# Patient Record
Sex: Female | Born: 1970
Health system: Southern US, Community
[De-identification: ages and names within clinical notes are randomized; demographics above are authoritative.]

## PROBLEM LIST (undated history)

## (undated) DIAGNOSIS — T7840XA Allergy, unspecified, initial encounter: Secondary | ICD-10-CM

## (undated) DIAGNOSIS — K219 Gastro-esophageal reflux disease without esophagitis: Secondary | ICD-10-CM

## (undated) DIAGNOSIS — O30049 Twin pregnancy, dichorionic/diamniotic, unspecified trimester: Secondary | ICD-10-CM

## (undated) DIAGNOSIS — C50919 Malignant neoplasm of unspecified site of unspecified female breast: Secondary | ICD-10-CM

## (undated) HISTORY — PX: LASIK: SHX215

## (undated) HISTORY — DX: Allergy, unspecified, initial encounter: T78.40XA

## (undated) HISTORY — DX: Malignant neoplasm of unspecified site of unspecified female breast: C50.919

## (undated) HISTORY — PX: HERNIA REPAIR: SHX51

## (undated) HISTORY — DX: Twin pregnancy, dichorionic/diamniotic, unspecified trimester: O30.049

---

## 2006-01-10 ENCOUNTER — Other Ambulatory Visit: Admission: RE | Admit: 2006-01-10 | Discharge: 2006-01-10 | Payer: Self-pay | Admitting: Gynecology

## 2006-01-16 ENCOUNTER — Ambulatory Visit (HOSPITAL_COMMUNITY): Admission: RE | Admit: 2006-01-16 | Discharge: 2006-01-16 | Payer: Self-pay | Admitting: Gynecology

## 2006-03-16 ENCOUNTER — Ambulatory Visit (HOSPITAL_BASED_OUTPATIENT_CLINIC_OR_DEPARTMENT_OTHER): Admission: RE | Admit: 2006-03-16 | Discharge: 2006-03-16 | Payer: Self-pay | Admitting: Gynecology

## 2006-03-16 ENCOUNTER — Encounter (INDEPENDENT_AMBULATORY_CARE_PROVIDER_SITE_OTHER): Payer: Self-pay | Admitting: *Deleted

## 2007-03-27 ENCOUNTER — Other Ambulatory Visit: Admission: RE | Admit: 2007-03-27 | Discharge: 2007-03-27 | Payer: Self-pay | Admitting: Gynecology

## 2010-12-19 ENCOUNTER — Encounter: Payer: Self-pay | Admitting: Gynecology

## 2011-04-15 NOTE — Op Note (Signed)
NAMEKENOSHA, DOSTER NO.:  [id number]   MEDICAL RECORD NO.:  [id number]          PATIENT TYPE:  AMB   LOCATION:  [facility]                         FACILITY:  [facility]   PHYSICIAN:  Timothy P. 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   POSTOPERATIVE DIAGNOSES:  Endometrial polyp.   PROCEDURE:  Hysteroscopic resection endometrial polyp, dilatation and  curettage.   SURGEON:  Timothy P. Fontaine, M.D.   ANESTHESIA:  General.   ESTIMATED BLOOD LOSS:  Minimal.   COMPLICATIONS:  None.   SPECIMEN:  1.  Endometrial polyps.  2.  Endometrial curettings   FINDINGS:  EUA revealed external BUS and vagina normal.  Cervix normal.  Uterus mildly retroverted, normal in size. Hysteroscopic small upper  posterior wall midline endometrial polyp, hysteroscopy, otherwise normal  noting fundus, anterior and posterior uterine surfaces, lower uterine  segment, endocervical canal, right and left tubal ostia all visualized.   PROCEDURE:  The patient was the patient was taken to the operating room,  underwent general anesthesia, was placed in a low dorsal lithotomy position,  received a perineal preparation with Betadine solution and vaginal  preparation with Betadine solution.  The bladder emptied with in-and-out  Foley catheterization.  EUA performed. The patient was draped in usual  fashion.  The cervix was visualized with a speculum, anterior lip grasped  with a single-tooth tenaculum, and the cervix was gently gradually dilated  to admit the operative hysteroscope.  Hysteroscopy was performed with  findings noted above.  Using the right-angle resectoscopic loop, the  endometrial polyp was resected at the level of the surrounding endometrium  and sent to pathology.  A sharp curettage was then performed and sent  to  pathology.  Repeat hysteroscopy showed an empty cavity, good distension, no  evidence of  perforation.  The instruments were removed.  The tenaculum removed.  Adequate hemostasis was visualized.  The speculum removed.  The patient was  placed in a supine position, awakened without difficulty, and taken to  recovery room in good condition having tolerated procedure well.      Timothy P. Fontaine, M.D.  Electronically Signed     TPF/MEDQ  D:  03/16/2006  T:  03/16/2006  Job:  [id number]

## 2011-04-15 NOTE — H&P (Signed)
Sierra Hensley, WIEDMAN NO.:  [id number]   MEDICAL RECORD NO.:  [id number]          PATIENT TYPE:  AMB   LOCATION:  [facility]                         FACILITY:  [facility]   PHYSICIAN:  Timothy P. Fontaine, M.D.DATE OF BIRTH:  02/27/1971   DATE OF ADMISSION:  03/16/2006  DATE OF DISCHARGE:                                HISTORY & PHYSICAL   CHIEF COMPLAINT:  Submucous myoma.   HISTORY OF PRESENT ILLNESS:  A 40 year old G0 who initially presented for a  gynecologic exam, complaining of infertility.  She underwent an HSG, which  showed a round filling defect, consistent with a submucous myoma.  She  underwent a sonohystogram, which confirmed an 8 x 6 mm endometrial defect,  consistent with a submucous myoma.  She is admitted at this time for  hysteroscopy resection of her myoma.   PAST SURGICAL HISTORY:  None.   PAST MEDICAL HISTORY:  None.   ALLERGIES:  None.   CURRENT MEDICATIONS:  None.   REVIEW OF SYSTEMS:  Noncontributory.   SOCIAL HISTORY:  Noncontributory.   FAMILY HISTORY:  Noncontributory.   ADMISSION PHYSICAL EXAMINATION:  VITAL SIGNS:  Afebrile.  Vital signs are  stable.  HEENT:  Normal.  LUNGS:  Clear.  CARDIAC:  Regular rate.  No murmurs, rubs or gallops.  ABDOMEN:  Benign.  PELVIC:  External BUS, vagina normal.  Cervix normal.  Uterus normal size.  Midline mobile, nontender.  Adnexa without masses or tenderness.   ASSESSMENT:  A 40 year old G0, HSG and sonohystogram both show endometrial  defect, consistent with a submucous myoma for hysteroscopy resection of  defect.  I reviewed with the patient and her friend interpreter, who is  involved with hysteroscopic surgery, to include the expected intraoperative  and postoperative courses.  The use of the resectoscope was reviewed, and  the risks of bleeding, transfusion, infection, prolonged antibiotics,  uterine perforation, damage to internal organs, including bowel, bladder,  ureters, vessels, and  nerves necessitating major exploratory reparative  surgeries and future reparative surgeries, including ostomy formation, was  all discussed, understood, and accepted.  She is attempted pregnancy and the  possibilities for intrauterine scarring and possible adverse effects as far  as fertility is concerned related to the surgery were all discussed,  understood, and accepted.  The use of sorbitol distendant and the  possibilities of absorption causing metabolic complications such as seizure,  coma, was also discussed, understood, and accepted.  No guarantees, as far  as pregnancy, is related, and she  understands that she may not achieve pregnancy, despite having the surgery,  all of which was understood and accepted.  The patient's questions are  answered to her satisfaction through her interpreter, and she is ready to  proceed with surgery.      Timothy P. Fontaine, M.D.  Electronically Signed     TPF/MEDQ  D:  03/13/2006  T:  03/13/2006  Job:  [id number]

## 2012-09-21 ENCOUNTER — Encounter: Payer: Self-pay | Admitting: Gastroenterology

## 2012-10-17 ENCOUNTER — Ambulatory Visit: Payer: Self-pay | Admitting: Gastroenterology

## 2013-06-14 ENCOUNTER — Other Ambulatory Visit: Payer: Self-pay | Admitting: Obstetrics and Gynecology

## 2013-07-01 ENCOUNTER — Other Ambulatory Visit (HOSPITAL_COMMUNITY): Payer: Self-pay | Admitting: Obstetrics and Gynecology

## 2013-07-01 DIAGNOSIS — IMO0002 Reserved for concepts with insufficient information to code with codable children: Secondary | ICD-10-CM

## 2013-07-04 ENCOUNTER — Ambulatory Visit (HOSPITAL_COMMUNITY)
Admission: RE | Admit: 2013-07-04 | Discharge: 2013-07-04 | Disposition: A | Discharge status: Home / Self Care | Payer: BC Managed Care – PPO | Source: Ambulatory Visit | Attending: Obstetrics and Gynecology | Admitting: Obstetrics and Gynecology

## 2013-07-04 DIAGNOSIS — N979 Female infertility, unspecified: Secondary | ICD-10-CM | POA: Insufficient documentation

## 2013-07-04 DIAGNOSIS — IMO0002 Reserved for concepts with insufficient information to code with codable children: Secondary | ICD-10-CM

## 2013-07-04 MED ORDER — IOHEXOL 300 MG/ML  SOLN
20.0000 mL | Freq: Once | INTRAMUSCULAR | Status: AC | PRN
  Administered 2013-07-04: 9 mL

## 2013-07-25 ENCOUNTER — Other Ambulatory Visit (HOSPITAL_COMMUNITY): Payer: Self-pay | Admitting: Obstetrics and Gynecology

## 2013-07-25 DIAGNOSIS — Z3141 Encounter for fertility testing: Secondary | ICD-10-CM

## 2013-07-30 ENCOUNTER — Ambulatory Visit (HOSPITAL_COMMUNITY)
Admission: RE | Admit: 2013-07-30 | Discharge: 2013-07-30 | Disposition: A | Discharge status: Home / Self Care | Payer: BC Managed Care – PPO | Source: Ambulatory Visit | Attending: Obstetrics and Gynecology | Admitting: Obstetrics and Gynecology

## 2013-07-30 DIAGNOSIS — Z3141 Encounter for fertility testing: Secondary | ICD-10-CM

## 2013-07-30 DIAGNOSIS — N979 Female infertility, unspecified: Secondary | ICD-10-CM | POA: Insufficient documentation

## 2013-07-30 MED ORDER — IOHEXOL 300 MG/ML  SOLN
20.0000 mL | Freq: Once | INTRAMUSCULAR | Status: AC | PRN
  Administered 2013-07-30: 20 mL

## 2013-09-09 DIAGNOSIS — K219 Gastro-esophageal reflux disease without esophagitis: Secondary | ICD-10-CM | POA: Insufficient documentation

## 2013-10-09 ENCOUNTER — Other Ambulatory Visit: Payer: Self-pay | Admitting: Gastroenterology

## 2013-10-09 DIAGNOSIS — R1310 Dysphagia, unspecified: Secondary | ICD-10-CM

## 2013-10-14 ENCOUNTER — Ambulatory Visit
Admission: RE | Admit: 2013-10-14 | Discharge: 2013-10-14 | Disposition: A | Discharge status: Home / Self Care | Payer: BC Managed Care – PPO | Source: Ambulatory Visit | Attending: Gastroenterology | Admitting: Gastroenterology

## 2013-10-14 DIAGNOSIS — R1310 Dysphagia, unspecified: Secondary | ICD-10-CM

## 2014-05-15 LAB — OB RESULTS CONSOLE HIV ANTIBODY (ROUTINE TESTING): HIV: NONREACTIVE

## 2014-07-16 LAB — OB RESULTS CONSOLE RUBELLA ANTIBODY, IGM: Rubella: IMMUNE

## 2014-07-16 LAB — OB RESULTS CONSOLE RPR: RPR: NONREACTIVE

## 2014-07-16 LAB — OB RESULTS CONSOLE GC/CHLAMYDIA
CHLAMYDIA, DNA PROBE: NEGATIVE
GC PROBE AMP, GENITAL: NEGATIVE

## 2014-07-16 LAB — OB RESULTS CONSOLE HEPATITIS B SURFACE ANTIGEN: HEP B S AG: NEGATIVE

## 2014-11-06 ENCOUNTER — Encounter: Payer: Self-pay | Admitting: *Deleted

## 2014-11-06 ENCOUNTER — Encounter: Payer: Medicaid Other | Attending: Obstetrics and Gynecology | Admitting: *Deleted

## 2014-11-06 DIAGNOSIS — Z713 Dietary counseling and surveillance: Secondary | ICD-10-CM | POA: Insufficient documentation

## 2014-11-06 DIAGNOSIS — O24419 Gestational diabetes mellitus in pregnancy, unspecified control: Secondary | ICD-10-CM | POA: Diagnosis present

## 2014-11-06 NOTE — Progress Notes (Signed)
  Patient was seen on 11/06/14 for individual Gestational Diabetes self-management education at [facility]. She is accompanied by a family member and she has signed a Glass blower/designer for interpretive services.    The following learning objectives were met by the patient during this appointment:   States the definition of Gestational Diabetes  States why dietary management is important in controlling blood glucose  Describes the effects each nutrient has on blood glucose levels  Demonstrates ability to create a balanced meal plan  Demonstrates carbohydrate counting   States when to check blood glucose levels  Demonstrates proper blood glucose monitoring techniques  States the effect of stress and exercise on blood glucose levels  States the importance of limiting caffeine and abstaining from alcohol and smoking  Blood glucose monitor given: Accu Chek Nano BG Monitoring Kit Lot # [id number] Exp: 07/29/15 Blood glucose reading: 102 mg/dl (fasting)  Patient instructed to monitor glucose levels: FBS: 60 - <90 1 hour: <140   *Patient received handouts:  Nutrition Diabetes and Pregnancy in [place]  Patient will be seen for follow-up as needed.

## 2014-11-24 ENCOUNTER — Encounter: Payer: Self-pay | Admitting: Obstetrics and Gynecology

## 2014-11-29 ENCOUNTER — Inpatient Hospital Stay (HOSPITAL_COMMUNITY)
Admission: AD | Admit: 2014-11-29 | Discharge: 2014-12-12 | DRG: 765 | Disposition: A | Discharge status: Home / Self Care | Payer: Medicaid Other | Source: Ambulatory Visit | Attending: Obstetrics and Gynecology | Admitting: Obstetrics and Gynecology

## 2014-11-29 ENCOUNTER — Encounter (HOSPITAL_COMMUNITY): Payer: Self-pay | Admitting: *Deleted

## 2014-11-29 ENCOUNTER — Inpatient Hospital Stay (HOSPITAL_COMMUNITY): Payer: Medicaid Other

## 2014-11-29 ENCOUNTER — Encounter: Payer: Self-pay | Admitting: Obstetrics and Gynecology

## 2014-11-29 DIAGNOSIS — O30043 Twin pregnancy, dichorionic/diamniotic, third trimester: Secondary | ICD-10-CM | POA: Diagnosis present

## 2014-11-29 DIAGNOSIS — O321XX2 Maternal care for breech presentation, fetus 2: Secondary | ICD-10-CM | POA: Diagnosis not present

## 2014-11-29 DIAGNOSIS — O24420 Gestational diabetes mellitus in childbirth, diet controlled: Secondary | ICD-10-CM | POA: Diagnosis present

## 2014-11-29 DIAGNOSIS — O09513 Supervision of elderly primigravida, third trimester: Secondary | ICD-10-CM | POA: Diagnosis not present

## 2014-11-29 DIAGNOSIS — O42913 Preterm premature rupture of membranes, unspecified as to length of time between rupture and onset of labor, third trimester: Secondary | ICD-10-CM | POA: Diagnosis present

## 2014-11-29 DIAGNOSIS — Z3A32 32 weeks gestation of pregnancy: Secondary | ICD-10-CM | POA: Diagnosis present

## 2014-11-29 DIAGNOSIS — O30049 Twin pregnancy, dichorionic/diamniotic, unspecified trimester: Secondary | ICD-10-CM

## 2014-11-29 DIAGNOSIS — O321XX1 Maternal care for breech presentation, fetus 1: Principal | ICD-10-CM | POA: Diagnosis not present

## 2014-11-29 DIAGNOSIS — O4290 Premature rupture of membranes, unspecified as to length of time between rupture and onset of labor, unspecified weeks of gestation: Secondary | ICD-10-CM

## 2014-11-29 DIAGNOSIS — O322XX1 Maternal care for transverse and oblique lie, fetus 1: Secondary | ICD-10-CM

## 2014-11-29 DIAGNOSIS — O30009 Twin pregnancy, unspecified number of placenta and unspecified number of amniotic sacs, unspecified trimester: Secondary | ICD-10-CM | POA: Diagnosis present

## 2014-11-29 HISTORY — DX: Twin pregnancy, dichorionic/diamniotic, unspecified trimester: O30.049

## 2014-11-29 LAB — GLUCOSE, CAPILLARY
GLUCOSE-CAPILLARY: 121 mg/dL — AB (ref 70–99)
GLUCOSE-CAPILLARY: 134 mg/dL — AB (ref 70–99)
Glucose-Capillary: 89 mg/dL (ref 70–99)

## 2014-11-29 LAB — TYPE AND SCREEN
ABO/RH(D): B POS
ANTIBODY SCREEN: NEGATIVE

## 2014-11-29 LAB — CBC
HCT: 36.4 % (ref 36.0–46.0)
Hemoglobin: 12 g/dL (ref 12.0–15.0)
MCH: 30.1 pg (ref 26.0–34.0)
MCHC: 33 g/dL (ref 30.0–36.0)
MCV: 91.2 fL (ref 78.0–100.0)
PLATELETS: 199 10*3/uL (ref 150–400)
RBC: 3.99 MIL/uL (ref 3.87–5.11)
RDW: 13.7 % (ref 11.5–15.5)
WBC: 6 10*3/uL (ref 4.0–10.5)

## 2014-11-29 LAB — POCT FERN TEST: POCT FERN TEST: POSITIVE

## 2014-11-29 MED ORDER — SODIUM CHLORIDE 0.9 % IV SOLN
1.0000 g | Freq: Four times a day (QID) | INTRAVENOUS | Status: DC
  Administered 2014-11-29 – 2014-12-01 (×7): 1 g via INTRAVENOUS
  Filled 2014-11-29 (×10): qty 1000

## 2014-11-29 MED ORDER — PRENATAL MULTIVITAMIN CH
1.0000 | ORAL_TABLET | Freq: Every day | ORAL | Status: DC
  Administered 2014-11-29 – 2014-12-08 (×10): 1 via ORAL
  Filled 2014-11-29 (×11): qty 1

## 2014-11-29 MED ORDER — DOCUSATE SODIUM 100 MG PO CAPS
100.0000 mg | ORAL_CAPSULE | Freq: Every day | ORAL | Status: DC
  Administered 2014-11-29 – 2014-12-08 (×10): 100 mg via ORAL
  Filled 2014-11-29 (×12): qty 1

## 2014-11-29 MED ORDER — SODIUM CHLORIDE 0.9 % IV SOLN: 250.0000 mL | INTRAVENOUS | Status: DC | PRN

## 2014-11-29 MED ORDER — SODIUM CHLORIDE 0.9 % IV SOLN
2.0000 g | Freq: Once | INTRAVENOUS | Status: AC
  Administered 2014-11-29: 2 g via INTRAVENOUS
  Filled 2014-11-29: qty 2000

## 2014-11-29 MED ORDER — ZOLPIDEM TARTRATE 5 MG PO TABS: 5.0000 mg | ORAL_TABLET | Freq: Every evening | ORAL | Status: DC | PRN

## 2014-11-29 MED ORDER — LACTATED RINGERS IV BOLUS (SEPSIS)
1000.0000 mL | Freq: Once | INTRAVENOUS | Status: AC
  Administered 2014-11-29: 1000 mL via INTRAVENOUS

## 2014-11-29 MED ORDER — SODIUM CHLORIDE 0.9 % IJ SOLN
3.0000 mL | Freq: Two times a day (BID) | INTRAMUSCULAR | Status: DC
  Administered 2014-11-29 – 2014-12-08 (×19): 3 mL via INTRAVENOUS

## 2014-11-29 MED ORDER — SODIUM CHLORIDE 0.9 % IJ SOLN
3.0000 mL | INTRAMUSCULAR | Status: DC | PRN
  Administered 2014-12-02: 3 mL via INTRAVENOUS
  Filled 2014-11-29: qty 3

## 2014-11-29 MED ORDER — AZITHROMYCIN 250 MG PO TABS
500.0000 mg | ORAL_TABLET | Freq: Every day | ORAL | Status: AC
  Administered 2014-11-29 – 2014-12-05 (×7): 500 mg via ORAL
  Filled 2014-11-29 (×9): qty 2

## 2014-11-29 MED ORDER — ACETAMINOPHEN 325 MG PO TABS: 650.0000 mg | ORAL_TABLET | ORAL | Status: DC | PRN

## 2014-11-29 MED ORDER — BETAMETHASONE SOD PHOS & ACET 6 (3-3) MG/ML IJ SUSP
12.0000 mg | Freq: Once | INTRAMUSCULAR | Status: AC
  Administered 2014-11-30: 12 mg via INTRAMUSCULAR
  Filled 2014-11-29: qty 2

## 2014-11-29 MED ORDER — CALCIUM CARBONATE ANTACID 500 MG PO CHEW
2.0000 | CHEWABLE_TABLET | ORAL | Status: DC | PRN
  Filled 2014-11-29: qty 2

## 2014-11-29 MED ORDER — BETAMETHASONE SOD PHOS & ACET 6 (3-3) MG/ML IJ SUSP
12.0000 mg | Freq: Once | INTRAMUSCULAR | Status: AC
  Administered 2014-11-29: 12 mg via INTRAMUSCULAR
  Filled 2014-11-29: qty 2

## 2014-11-29 NOTE — Progress Notes (Signed)
Pt arrived by EMS, pt dripping clear fluid, fern positive.  Up to bathroom then to bed # 10.  TWINS, monitors applied, assessing.  Vitals stable.  Reported to Ginger RN and [employer].  Limited English and Ginger is calling Language Line at this time.

## 2014-11-29 NOTE — MAU Note (Signed)
Came in by EMS.  TWINS.  Water broke at 5:40 am.  Clear. No bleeding.  Babies moving well.

## 2014-11-29 NOTE — H&P (Signed)
Sierra Hensley is a 44 y.o. female, G1 P0, EGA [redacted]W[redacted]D with EDC 2-21, di/di twin pregnancy from donor eggs, presenting for evaluation of leaking fluid.  Eval in MAU confirms ROM, cervix not significantly dilated visually, transverse/transverse presentation by u/s.  Prenatal care complicated by di/di twins, elevated risk of Trisomy 21 by quad screen, normal 46XX and 46XY by amnio, GDM controlled with diet.  See prenatal records for complete history.  Maternal Medical History:  Reason for admission: Rupture of membranes.   Contractions: Frequency: irregular.   Perceived severity is mild.    Fetal activity: Perceived fetal activity is normal.    Prenatal Complications - Diabetes: gestational. Diabetes is managed by diet.      OB History    Gravida Para Term Preterm AB TAB SAB Ectopic Multiple Living   1              History reviewed. No pertinent past medical history. History reviewed. No pertinent past surgical history. Family History: family history is not on file. Social History:  reports that she has never smoked. She has never used smokeless tobacco. She reports that she does not drink alcohol or use illicit drugs.   Prenatal Transfer Tool  Maternal Diabetes: Yes:  Diabetes Type:  Diet controlled Genetic Screening: Normal Maternal Ultrasounds/Referrals: Normal Fetal Ultrasounds or other Referrals:  None Maternal Substance Abuse:  No Significant Maternal Medications:  None Significant Maternal Lab Results:  None Other Comments:  Di/di twins, PPPROM, elevated risk Trisomy 21 by quad screen, nl 46XX and 46XY by amnio, donor egg IVF  Review of Systems  Respiratory: Negative.   Cardiovascular: Negative.       Blood pressure 129/76, pulse 74, temperature 98.5 F (36.9 C), temperature source Oral, resp. rate 18, height  (1.626 m), weight 84.369 kg (186 lb), SpO2 100 %. Maternal Exam:  Uterine Assessment: Contraction strength is mild.  Contraction frequency is irregular.    Abdomen: Patient reports no abdominal tenderness. Introitus: Normal vulva. Normal vagina.  Ferning test: positive.  Amniotic fluid character: clear.     Fetal Exam Fetal Monitor Review: Mode: ultrasound.   Variability: moderate (6-25 bpm).   Pattern: accelerations present and no decelerations.    Fetal State Assessment: Category I - tracings are normal.     Physical Exam  Vitals reviewed. Constitutional: She appears well-developed and well-nourished.  Cardiovascular: Normal rate, regular rhythm and normal heart sounds.   No murmur heard. Respiratory: Effort normal and breath sounds normal. No respiratory distress. She has no wheezes.  GI: Soft.    Prenatal labs: ABO, Rh: --/--/B POS (01/02 0930) Antibody: PENDING (01/02 0930) Rubella:  Immune RPR:   NR HBsAg:   Neg HIV:   Neg GBS:   preterm GCT:  139, abnl GTT  Assessment/Plan: Di/di twin IUP at [redacted]W[redacted]D with PPROM of twin A.  Transverse/transverse by u/s today.  Will admit, Amp and Zithromax for latency, will give betamethasone for fetal lung maturity.  Will monitor for labor or signs/sx of chorio, will need c-section.  Will do c-section at 34+ weeks if has not delivered before then.  Will monitor sugars with GDM controlled with diet so far.   Hudson Majkowski D 11/29/2014, 9:56 AM

## 2014-11-29 NOTE — MAU Provider Note (Signed)
S: Sierra Hensley is a 44 y.o. year old G1P0 female at [redacted]w[redacted]d weeks gestation who presents to MAU reporting leaking large amount of clear fluid at 0540. Di/di twins.  GDM- no meds.   O:  Patient Vitals for the past 24 hrs:  BP Temp Temp src Pulse Resp SpO2  11/29/14 0711 129/76 mmHg 98.5 F (36.9 C) Oral 74 18 100 %   FHR 140, reactive, x 2 UC's Q3-6. Mild PELVIC: Pooling large amount of clear fluid. No able to clearly see cervix, but not obviously open. No bleeding.  OB Limited Prelim: A and B transverse   A: 32.6 week Di/Di twins, grossly ruptured PPROM A1GDM  P: Admit to Antenatal per consult w/ Dr. Jackelyn Knife. BMZ Amp IV   [place], [place] 11/29/2014 8:26 AM

## 2014-11-30 LAB — GLUCOSE, CAPILLARY
GLUCOSE-CAPILLARY: 89 mg/dL (ref 70–99)
Glucose-Capillary: 93 mg/dL (ref 70–99)
Glucose-Capillary: 104 mg/dL — ABNORMAL HIGH (ref 70–99)
Glucose-Capillary: 188 mg/dL — ABNORMAL HIGH (ref 70–99)

## 2014-11-30 LAB — RPR

## 2014-11-30 LAB — ABO/RH: ABO/RH(D): B POS

## 2014-11-30 NOTE — Progress Notes (Signed)
Antenatal Nutrition Assessment:  Currently  [redacted] weeks gestation, with twins/PPROM/GDM. Height  64 "  Weight 186 lbs  pre-pregnancy weight 165 lbs .  Pre-pregnancy  BMI 28.4  IBW 120 lbs Total weight gain 21.lbs Weight gain goals 31-50 lbs Estimated needs: 2100-2300 kcal/day, 90-100 grams protein/day, 2.5 liters fluid/day  Carbohydrate modified gestational diet  Current diet prescription will provide for increased needs.  Nutrition related labs (s/p BMZ) CBG (last 3)   Recent Labs  11/30/14 1041 11/30/14 1402 11/30/14 1856  GLUCAP 104* 188* 89      Nutrition Dx: Increased nutrient needs r/t pregnancy and fetal growth requirements aeb [redacted] weeks gestation.  No educational needs assessed at this time. GDM education 11/06/14 at [facility]  [facility] M.Odis Luster LDN Neonatal Nutrition Support Specialist/RD III Pager [phone]

## 2014-11-30 NOTE — Plan of Care (Signed)
Problem: Consults Goal: Birthing Suites Patient Information Press F2 to bring up selections list   Pt < [redacted] weeks EGA     

## 2014-11-30 NOTE — Progress Notes (Addendum)
HD #2, [redacted]W[redacted]D, di/di twins, PPROM, GDM Feels ok, leaking fluid, good FM, no significant ctx Afeb, VSS All sugars ok so far FHT- reactive x 2 Will continue Amp and Zithromax, second betamethasone this am, monitor for labor or infection, c-section in 8-9 days if not delivered

## 2014-12-01 LAB — GLUCOSE, CAPILLARY
GLUCOSE-CAPILLARY: 104 mg/dL — AB (ref 70–99)
Glucose-Capillary: 86 mg/dL (ref 70–99)
Glucose-Capillary: 96 mg/dL (ref 70–99)
Glucose-Capillary: 160 mg/dL — ABNORMAL HIGH (ref 70–99)

## 2014-12-01 MED ORDER — AMOXICILLIN 500 MG PO CAPS
500.0000 mg | ORAL_CAPSULE | Freq: Three times a day (TID) | ORAL | Status: AC
  Administered 2014-12-01 – 2014-12-06 (×15): 500 mg via ORAL
  Filled 2014-12-01 (×15): qty 1

## 2014-12-01 NOTE — Progress Notes (Signed)
HD #3, [redacted]W[redacted]D, twins, PPROM, GDM Doing ok, nothing new Afeb, VSS One sugar 188-probably due to dietary discretion, all others are ok FHT- reactive NST on both babies, occasional ctx Fundus NT Continue observation, change Ampicillin to Amoxicillin, continue Zithromax, continue to monitor sugars

## 2014-12-01 NOTE — Progress Notes (Signed)
CBG 160. Pt states she ate rice and had orange juice to drink for lunch. Educated about juice and carbs and instructed her to limit intake. Will continue to monitor CBG's.

## 2014-12-01 NOTE — Progress Notes (Signed)
Ur chart review completed.  

## 2014-12-02 LAB — GLUCOSE, CAPILLARY
Glucose-Capillary: 63 mg/dL — ABNORMAL LOW (ref 70–99)
Glucose-Capillary: 102 mg/dL — ABNORMAL HIGH (ref 70–99)
Glucose-Capillary: 99 mg/dL (ref 70–99)
Glucose-Capillary: 103 mg/dL — ABNORMAL HIGH (ref 70–99)

## 2014-12-02 NOTE — Plan of Care (Signed)
Problem: Consults Goal: Birthing Suites Patient Information Press F2 to bring up selections list   Pt < [redacted] weeks EGA     

## 2014-12-02 NOTE — Progress Notes (Signed)
[redacted]W[redacted]D, Twins, PPROM, GDM Doing ok, nothing new Afeb, VSS CBG with one elevated value daily FHT- reactive x 2, irreg ctx Continue Amox/Zithro and observation.  Has c-section scheduled for a week from today at 0730.

## 2014-12-03 LAB — GLUCOSE, CAPILLARY
GLUCOSE-CAPILLARY: 102 mg/dL — AB (ref 70–99)
Glucose-Capillary: 79 mg/dL (ref 70–99)
Glucose-Capillary: 94 mg/dL (ref 70–99)
Glucose-Capillary: 109 mg/dL — ABNORMAL HIGH (ref 70–99)

## 2014-12-03 NOTE — Plan of Care (Signed)
Problem: Consults Goal: Neonatologist Consult Spoke to Dr. Mikle Boswortharlos regarding pt need for consult.

## 2014-12-03 NOTE — Progress Notes (Signed)
Dr. Mikle Boswortharlos called and informed of pt's need for Neo consult, informed pt scheduled for delivery via Cesarean on 12/09/2014.

## 2014-12-03 NOTE — Consult Note (Signed)
Neonatology Consult to Antenatal Patient:  I was asked by Dr. Jackelyn KnifeMeisinger to see this patient in order to provide antenatal counseling due to anticipated c-section delivery of twins at [redacted] weeks GA.  Ms. Sierra Hensley was admitted 1/2 due to premature ROM and twin gestation. She is currently at  33 3/[redacted] weeks GA and is a diet-controlled GDM. She is not having active labor. She got 2 doses of BMZ on 1/2-3 and got IV Ampicillin for 3 days. She continues on po Amoxicillin and Zithromax for a 7-day course. The twins ar Di-Di from donor eggs, a girl and a boy, and both are  transverse. Amniocentesis showed normal 5746 XX and 7546 XY karyotypes. These are her first babies.  I spoke with the patient alone, via [place] interpreter. We discussed the planned delivery on Jan 12th, including usual DR management, possible respiratory complications and need for support, IV access, feedings (mother wants to  breast feed, but is unsure she will have adequate supply; I encouraged her and let her know that lactation support would be available), LOS, Mortality and Morbidity, and long term outcomes. She had a few questions, which I answered. I offered a NICU tour to any interested family members and would be glad to come back if she has more questions later.  Thank you for asking me to see this patient.  Doretha Souhristie C. Kameka Whan, MD Neonatologist  The total length of face-to-face or floor/unit time for this encounter was 25 minutes. Counseling and/or coordination of care was 20 minutes of the above.

## 2014-12-03 NOTE — Progress Notes (Signed)
[redacted]W[redacted]D, twins, PPROM, GDM Nothing new Afeb, VSS All CBG are normal FHT- reactive x 2, occ ctx Fundus NT Continue PO abx, monitor for infection or labor

## 2014-12-04 LAB — GLUCOSE, CAPILLARY
GLUCOSE-CAPILLARY: 60 mg/dL — AB (ref 70–99)
Glucose-Capillary: 106 mg/dL — ABNORMAL HIGH (ref 70–99)
Glucose-Capillary: 100 mg/dL — ABNORMAL HIGH (ref 70–99)
Glucose-Capillary: 113 mg/dL — ABNORMAL HIGH (ref 70–99)

## 2014-12-04 NOTE — Plan of Care (Signed)
Problem: Phase I Progression Outcomes Goal: Maintains reassuring Fetal Heart Rate Outcome: Progressing EFM tracing reassuring, meets criteria for reactive NST

## 2014-12-04 NOTE — Progress Notes (Signed)
[redacted]W[redacted]D, Twins, PPROM, GDM Nothing new Afeb, VSS All CBG are normal FHT- reactive x 2, occasional ctx Continue current care

## 2014-12-05 LAB — GLUCOSE, CAPILLARY
GLUCOSE-CAPILLARY: 97 mg/dL (ref 70–99)
GLUCOSE-CAPILLARY: 118 mg/dL — AB (ref 70–99)
Glucose-Capillary: 75 mg/dL (ref 70–99)
Glucose-Capillary: 155 mg/dL — ABNORMAL HIGH (ref 70–99)

## 2014-12-05 LAB — TYPE AND SCREEN
ABO/RH(D): B POS
ABO/RH(D): B POS
ANTIBODY SCREEN: NEGATIVE
Antibody Screen: NEGATIVE

## 2014-12-05 NOTE — Progress Notes (Signed)
[redacted]W[redacted]D, Twins, PPROM, GDM Nothing new, +FM Afeb, VSS All CBG are normal FHT- reactive x 2, occ ctx Continue observation and abx, plan for c-section 1-12

## 2014-12-06 ENCOUNTER — Encounter (HOSPITAL_COMMUNITY): Payer: Self-pay

## 2014-12-06 LAB — GLUCOSE, CAPILLARY
Glucose-Capillary: 89 mg/dL (ref 70–99)
Glucose-Capillary: 144 mg/dL — ABNORMAL HIGH (ref 70–99)
Glucose-Capillary: 162 mg/dL — ABNORMAL HIGH (ref 70–99)

## 2014-12-06 NOTE — Plan of Care (Signed)
Problem: Consults Goal: Skin Care Protocol Initiated - if Braden Score 18 or less If consults are not indicated, leave blank or document N/A  Outcome: Not Applicable Date Met:  69/62/95 Pt. With Braden scale of 21. Goal: Diabetes Guidelines per MD order/protocol Outcome: Progressing Bld. Sugars Fasting and 1 hr. Post prandial. Goal: Case Manager Consult Outcome: Progressing Seen on 1/416.  Problem: Phase I Progression Outcomes Goal: Diabetic Assessment Outcome: Progressing Bld. Sugar monitored Fasting and 2 hr. Post prandial. Goal: LOS < 4 days Outcome: Not Applicable Date Met:  28/41/32 Pt. Adm. On 11/29/14.

## 2014-12-06 NOTE — Progress Notes (Signed)
Patient ID: Sierra Hensley, female   DOB: 03/07/1971, 44 y.o.   MRN: [id number] Condition unchanged. Leaking clear fluid. NST reactive both babies

## 2014-12-06 NOTE — Plan of Care (Signed)
Blood sugar 162 at 1950, pt. eating food from home.  Notified Dr. Ambrose MantleHenley about blood sugar, nothing ordered at this time.  Monitor blood sugars tomorrow and possibly start Glyburide if pt. continues to have high blood sugars.  Inform pt. to monitor carb modified diet.

## 2014-12-07 LAB — GLUCOSE, CAPILLARY
GLUCOSE-CAPILLARY: 147 mg/dL — AB (ref 70–99)
Glucose-Capillary: 72 mg/dL (ref 70–99)
Glucose-Capillary: 116 mg/dL — ABNORMAL HIGH (ref 70–99)
Glucose-Capillary: 150 mg/dL — ABNORMAL HIGH (ref 70–99)

## 2014-12-07 NOTE — Progress Notes (Signed)
Patient ID: Sierra Hensley, female   DOB: 04/27/1971, 44 y.o.   MRN: [id number] BS was high yesterday We think it was because the family brought her food. We will check BS today. Monitor strip shows both babies reactive

## 2014-12-08 LAB — GLUCOSE, CAPILLARY
GLUCOSE-CAPILLARY: 116 mg/dL — AB (ref 70–99)
GLUCOSE-CAPILLARY: 132 mg/dL — AB (ref 70–99)
Glucose-Capillary: 77 mg/dL (ref 70–99)
Glucose-Capillary: 130 mg/dL — ABNORMAL HIGH (ref 70–99)

## 2014-12-08 LAB — TYPE AND SCREEN
ABO/RH(D): B POS
ANTIBODY SCREEN: NEGATIVE

## 2014-12-08 NOTE — Progress Notes (Signed)
Reactive NST baby A and B. Monitors removed to sleep, denies need for Ambien

## 2014-12-08 NOTE — Progress Notes (Signed)
[redacted]W[redacted]D, Twins, PPROM, GDM Nothing new Afeb, VSS CBG ok, occ 140-150 FHT- reactive x 2 Continue observation today, will get interpreter and go over c-section at lunch today.

## 2014-12-09 ENCOUNTER — Inpatient Hospital Stay (HOSPITAL_COMMUNITY): Payer: Medicaid Other | Admitting: Anesthesiology

## 2014-12-09 ENCOUNTER — Encounter (HOSPITAL_COMMUNITY)
Admission: AD | Disposition: A | Discharge status: Home / Self Care | Payer: Self-pay | Source: Ambulatory Visit | Attending: Obstetrics and Gynecology

## 2014-12-09 ENCOUNTER — Encounter (HOSPITAL_COMMUNITY): Payer: Self-pay | Admitting: Anesthesiology

## 2014-12-09 LAB — CBC
HCT: 35.7 % — ABNORMAL LOW (ref 36.0–46.0)
Hemoglobin: 11.6 g/dL — ABNORMAL LOW (ref 12.0–15.0)
MCH: 29.7 pg (ref 26.0–34.0)
MCHC: 32.5 g/dL (ref 30.0–36.0)
MCV: 91.5 fL (ref 78.0–100.0)
PLATELETS: 218 10*3/uL (ref 150–400)
RBC: 3.9 MIL/uL (ref 3.87–5.11)
RDW: 14.3 % (ref 11.5–15.5)
WBC: 7.5 10*3/uL (ref 4.0–10.5)

## 2014-12-09 LAB — GLUCOSE, CAPILLARY
Glucose-Capillary: 79 mg/dL (ref 70–99)
Glucose-Capillary: 79 mg/dL (ref 70–99)

## 2014-12-09 SURGERY — Surgical Case
Anesthesia: Regional

## 2014-12-09 SURGERY — Surgical Case
Anesthesia: Spinal | Site: Abdomen

## 2014-12-09 MED ORDER — LACTATED RINGERS IV SOLN
INTRAVENOUS | Status: DC
  Administered 2014-12-09: 22:00:00 via INTRAVENOUS

## 2014-12-09 MED ORDER — LANOLIN HYDROUS EX OINT
1.0000 | TOPICAL_OINTMENT | CUTANEOUS | Status: DC | PRN
Start: 2014-12-09 — End: 2014-12-12

## 2014-12-09 MED ORDER — NALBUPHINE HCL 10 MG/ML IJ SOLN: 5.0000 mg | INTRAMUSCULAR | Status: DC | PRN

## 2014-12-09 MED ORDER — SCOPOLAMINE 1 MG/3DAYS TD PT72
MEDICATED_PATCH | TRANSDERMAL | Status: AC
  Filled 2014-12-09: qty 1

## 2014-12-09 MED ORDER — LACTATED RINGERS IV BOLUS (SEPSIS)
500.0000 mL | Freq: Once | INTRAVENOUS | Status: AC
  Administered 2014-12-09: 500 mL via INTRAVENOUS

## 2014-12-09 MED ORDER — FENTANYL CITRATE 0.05 MG/ML IJ SOLN
INTRAMUSCULAR | Status: AC
  Administered 2014-12-09: 50 ug via INTRAVENOUS
  Filled 2014-12-09: qty 2

## 2014-12-09 MED ORDER — FENTANYL CITRATE 0.05 MG/ML IJ SOLN
25.0000 ug | INTRAMUSCULAR | Status: DC | PRN
  Administered 2014-12-09: 50 ug via INTRAVENOUS

## 2014-12-09 MED ORDER — KETOROLAC TROMETHAMINE 30 MG/ML IJ SOLN: 30.0000 mg | Freq: Four times a day (QID) | INTRAMUSCULAR | Status: AC | PRN

## 2014-12-09 MED ORDER — NALOXONE HCL 1 MG/ML IJ SOLN
1.0000 ug/kg/h | INTRAVENOUS | Status: DC | PRN
  Filled 2014-12-09: qty 2

## 2014-12-09 MED ORDER — DIPHENHYDRAMINE HCL 25 MG PO CAPS: 25.0000 mg | ORAL_CAPSULE | Freq: Four times a day (QID) | ORAL | Status: DC | PRN

## 2014-12-09 MED ORDER — LACTATED RINGERS IV SOLN
INTRAVENOUS | Status: DC | PRN
  Administered 2014-12-09 (×3): via INTRAVENOUS

## 2014-12-09 MED ORDER — TETANUS-DIPHTH-ACELL PERTUSSIS 5-2.5-18.5 LF-MCG/0.5 IM SUSP: 0.5000 mL | Freq: Once | INTRAMUSCULAR | Status: DC

## 2014-12-09 MED ORDER — ONDANSETRON HCL 4 MG/2ML IJ SOLN: 4.0000 mg | Freq: Three times a day (TID) | INTRAMUSCULAR | Status: DC | PRN

## 2014-12-09 MED ORDER — FENTANYL CITRATE 0.05 MG/ML IJ SOLN
INTRAMUSCULAR | Status: DC | PRN
  Administered 2014-12-09: 25 ug via INTRATHECAL

## 2014-12-09 MED ORDER — MORPHINE SULFATE (PF) 0.5 MG/ML IJ SOLN: INTRAMUSCULAR | Status: DC | PRN

## 2014-12-09 MED ORDER — DIBUCAINE 1 % RE OINT: 1.0000 "application " | TOPICAL_OINTMENT | RECTAL | Status: DC | PRN

## 2014-12-09 MED ORDER — MORPHINE SULFATE (PF) 0.5 MG/ML IJ SOLN
INTRAMUSCULAR | Status: DC | PRN
  Administered 2014-12-09: .15 mg via INTRATHECAL

## 2014-12-09 MED ORDER — NALBUPHINE HCL 10 MG/ML IJ SOLN: 5.0000 mg | Freq: Once | INTRAMUSCULAR | Status: AC | PRN

## 2014-12-09 MED ORDER — NALOXONE HCL 0.4 MG/ML IJ SOLN: 0.4000 mg | INTRAMUSCULAR | Status: DC | PRN

## 2014-12-09 MED ORDER — SIMETHICONE 80 MG PO CHEW
80.0000 mg | CHEWABLE_TABLET | ORAL | Status: DC | PRN
  Administered 2014-12-10 – 2014-12-11 (×3): 80 mg via ORAL
  Filled 2014-12-09 (×3): qty 1

## 2014-12-09 MED ORDER — MAGNESIUM HYDROXIDE 400 MG/5ML PO SUSP
30.0000 mL | ORAL | Status: DC | PRN
  Administered 2014-12-10: 30 mL via ORAL
  Filled 2014-12-09: qty 30

## 2014-12-09 MED ORDER — PHENYLEPHRINE 8 MG IN D5W 100 ML (0.08MG/ML) PREMIX OPTIME
INJECTION | INTRAVENOUS | Status: AC
Start: 2014-12-09 — End: 2014-12-09
  Filled 2014-12-09: qty 100

## 2014-12-09 MED ORDER — SENNOSIDES-DOCUSATE SODIUM 8.6-50 MG PO TABS
2.0000 | ORAL_TABLET | ORAL | Status: DC
  Administered 2014-12-09 – 2014-12-11 (×3): 2 via ORAL
  Filled 2014-12-09 (×3): qty 2

## 2014-12-09 MED ORDER — CEFAZOLIN SODIUM-DEXTROSE 2-3 GM-% IV SOLR
INTRAVENOUS | Status: DC | PRN
  Administered 2014-12-09: 2 g via INTRAVENOUS

## 2014-12-09 MED ORDER — MEASLES, MUMPS & RUBELLA VAC ~~LOC~~ INJ
0.5000 mL | INJECTION | Freq: Once | SUBCUTANEOUS | Status: DC
  Filled 2014-12-09: qty 0.5

## 2014-12-09 MED ORDER — LACTATED RINGERS IV SOLN
INTRAVENOUS | Status: DC | PRN
  Administered 2014-12-09: 08:00:00 via INTRAVENOUS

## 2014-12-09 MED ORDER — OXYCODONE-ACETAMINOPHEN 5-325 MG PO TABS
1.0000 | ORAL_TABLET | ORAL | Status: DC | PRN
  Administered 2014-12-10 – 2014-12-12 (×10): 1 via ORAL
  Filled 2014-12-09 (×10): qty 1

## 2014-12-09 MED ORDER — SODIUM CHLORIDE 0.9 % IJ SOLN: 3.0000 mL | INTRAMUSCULAR | Status: DC | PRN

## 2014-12-09 MED ORDER — PHENYLEPHRINE 8 MG IN D5W 100 ML (0.08MG/ML) PREMIX OPTIME
INJECTION | INTRAVENOUS | Status: DC | PRN
  Administered 2014-12-09: 60 ug/min via INTRAVENOUS

## 2014-12-09 MED ORDER — DIPHENHYDRAMINE HCL 25 MG PO CAPS
25.0000 mg | ORAL_CAPSULE | ORAL | Status: DC | PRN
Start: 2014-12-09 — End: 2014-12-12

## 2014-12-09 MED ORDER — METHYLERGONOVINE MALEATE 0.2 MG/ML IJ SOLN
INTRAMUSCULAR | Status: AC
  Filled 2014-12-09: qty 1

## 2014-12-09 MED ORDER — SCOPOLAMINE 1 MG/3DAYS TD PT72
1.0000 | MEDICATED_PATCH | Freq: Once | TRANSDERMAL | Status: AC
  Administered 2014-12-09: 1.5 mg via TRANSDERMAL

## 2014-12-09 MED ORDER — PRENATAL MULTIVITAMIN CH
1.0000 | ORAL_TABLET | Freq: Every day | ORAL | Status: DC
  Administered 2014-12-10 – 2014-12-12 (×3): 1 via ORAL
  Filled 2014-12-09 (×3): qty 1

## 2014-12-09 MED ORDER — METHYLERGONOVINE MALEATE 0.2 MG/ML IJ SOLN
0.2000 mg | Freq: Once | INTRAMUSCULAR | Status: AC
  Administered 2014-12-09: 0.2 mg via INTRAMUSCULAR

## 2014-12-09 MED ORDER — CEFAZOLIN SODIUM-DEXTROSE 2-3 GM-% IV SOLR
2.0000 g | INTRAVENOUS | Status: DC
  Filled 2014-12-09: qty 50

## 2014-12-09 MED ORDER — PHENYLEPHRINE 40 MCG/ML (10ML) SYRINGE FOR IV PUSH (FOR BLOOD PRESSURE SUPPORT)
PREFILLED_SYRINGE | INTRAVENOUS | Status: AC
  Filled 2014-12-09: qty 10

## 2014-12-09 MED ORDER — ONDANSETRON HCL 4 MG/2ML IJ SOLN
INTRAMUSCULAR | Status: AC
  Filled 2014-12-09: qty 2

## 2014-12-09 MED ORDER — OXYTOCIN 40 UNITS IN LACTATED RINGERS INFUSION - SIMPLE MED: 62.5000 mL/h | INTRAVENOUS | Status: AC

## 2014-12-09 MED ORDER — CITRIC ACID-SODIUM CITRATE 334-500 MG/5ML PO SOLN
30.0000 mL | Freq: Once | ORAL | Status: AC
  Administered 2014-12-09: 30 mL via ORAL
  Filled 2014-12-09: qty 15

## 2014-12-09 MED ORDER — MENTHOL 3 MG MT LOZG: 1.0000 | LOZENGE | OROMUCOSAL | Status: DC | PRN

## 2014-12-09 MED ORDER — OXYTOCIN 40 UNITS IN LACTATED RINGERS INFUSION - SIMPLE MED
INTRAVENOUS | Status: DC | PRN
  Administered 2014-12-09: 40 [IU] via INTRAVENOUS

## 2014-12-09 MED ORDER — ONDANSETRON HCL 4 MG/2ML IJ SOLN: 4.0000 mg | INTRAMUSCULAR | Status: DC | PRN

## 2014-12-09 MED ORDER — FENTANYL CITRATE 0.05 MG/ML IJ SOLN
INTRAMUSCULAR | Status: AC
  Filled 2014-12-09: qty 2

## 2014-12-09 MED ORDER — LACTATED RINGERS IV SOLN
INTRAVENOUS | Status: DC
  Administered 2014-12-09: 06:00:00 via INTRAVENOUS

## 2014-12-09 MED ORDER — OXYTOCIN 10 UNIT/ML IJ SOLN
INTRAMUSCULAR | Status: AC
  Filled 2014-12-09: qty 4

## 2014-12-09 MED ORDER — BUPIVACAINE IN DEXTROSE 0.75-8.25 % IT SOLN
INTRATHECAL | Status: DC | PRN
  Administered 2014-12-09: 13 mL via INTRATHECAL

## 2014-12-09 MED ORDER — MORPHINE SULFATE 0.5 MG/ML IJ SOLN
INTRAMUSCULAR | Status: AC
  Filled 2014-12-09: qty 10

## 2014-12-09 MED ORDER — ONDANSETRON HCL 4 MG/2ML IJ SOLN
INTRAMUSCULAR | Status: DC | PRN
  Administered 2014-12-09: 4 mg via INTRAVENOUS

## 2014-12-09 MED ORDER — WITCH HAZEL-GLYCERIN EX PADS: 1.0000 "application " | MEDICATED_PAD | CUTANEOUS | Status: DC | PRN

## 2014-12-09 MED ORDER — ZOLPIDEM TARTRATE 5 MG PO TABS: 5.0000 mg | ORAL_TABLET | Freq: Every evening | ORAL | Status: DC | PRN

## 2014-12-09 MED ORDER — DIPHENHYDRAMINE HCL 50 MG/ML IJ SOLN: 12.5000 mg | INTRAMUSCULAR | Status: DC | PRN

## 2014-12-09 MED ORDER — IBUPROFEN 600 MG PO TABS
600.0000 mg | ORAL_TABLET | Freq: Four times a day (QID) | ORAL | Status: DC
  Administered 2014-12-09 – 2014-12-12 (×13): 600 mg via ORAL
  Filled 2014-12-09 (×13): qty 1

## 2014-12-09 MED ORDER — MEPERIDINE HCL 25 MG/ML IJ SOLN
6.2500 mg | INTRAMUSCULAR | Status: DC | PRN
Start: 2014-12-09 — End: 2014-12-09

## 2014-12-09 MED ORDER — ONDANSETRON HCL 4 MG PO TABS
4.0000 mg | ORAL_TABLET | ORAL | Status: DC | PRN
Start: 2014-12-09 — End: 2014-12-12

## 2014-12-09 SURGICAL SUPPLY — 21 items
CLOTH BEACON ORANGE TIMEOUT ST (SAFETY) ×2 IMPLANT
DRSG OPSITE POSTOP 4X10 (GAUZE/BANDAGES/DRESSINGS) ×2 IMPLANT
DURAPREP 26ML APPLICATOR (WOUND CARE) ×2 IMPLANT
ELECT REM PT RETURN 9FT ADLT (ELECTROSURGICAL) ×2
ELECTRODE REM PT RTRN 9FT ADLT (ELECTROSURGICAL) ×1 IMPLANT
GLOVE BIO SURGEON STRL SZ8 (GLOVE) ×2 IMPLANT
GLOVE ORTHO TXT STRL SZ7.5 (GLOVE) ×2 IMPLANT
GOWN PREVENTION PLUS LG XLONG (DISPOSABLE) ×6 IMPLANT
NEEDLE SPNL 22GX7 QUINCKE BK (NEEDLE) ×4 IMPLANT
NS IRRIG 1000ML POUR BTL (IV SOLUTION) ×2 IMPLANT
PACK C SECTION WH (CUSTOM PROCEDURE TRAY) ×2 IMPLANT
RTRCTR C-SECT PINK 25CM LRG (MISCELLANEOUS) ×2 IMPLANT
SLEEVE SCD COMPRESS KNEE MED (MISCELLANEOUS) ×2 IMPLANT
SUT CHROMIC 1 CTX 36 (SUTURE) ×6 IMPLANT
SUT PLAIN 2 0 XLH (SUTURE) ×2 IMPLANT
SUT VIC AB 0 CT1 27 (SUTURE) ×2
SUT VIC AB 0 CT1 27XBRD ANBCTR (SUTURE) ×2 IMPLANT
SUT VIC AB 4-0 KS 27 (SUTURE) ×2 IMPLANT
SYR BULB IRRIGATION 50ML (SYRINGE) ×2 IMPLANT
TOWEL OR 17X24 6PK STRL BLUE (TOWEL DISPOSABLE) ×4 IMPLANT
TRAY FOLEY CATH 14FR (SET/KITS/TRAYS/PACK) ×2 IMPLANT

## 2014-12-09 NOTE — Anesthesia Preprocedure Evaluation (Addendum)
Anesthesia Evaluation  Patient identified by MRN, date of birth, ID band Patient awake    Reviewed: Allergy & Precautions, NPO status , Patient's Chart, lab work & pertinent test results  Airway Mallampati: IV  TM Distance: >3 FB Neck ROM: Full  Mouth opening: Limited Mouth Opening  Dental no notable dental hx. (+) Teeth Intact   Pulmonary neg pulmonary ROS,  breath sounds clear to auscultation  Pulmonary exam normal       Cardiovascular negative cardio ROS  Rhythm:Regular Rate:Normal     Neuro/Psych negative neurological ROS  negative psych ROS   GI/Hepatic Neg liver ROS, GERD-  ,  Endo/Other  Obesity  Renal/GU negative Renal ROS  negative genitourinary   Musculoskeletal negative musculoskeletal ROS (+)   Abdominal   Peds  Hematology  (+) anemia ,   Anesthesia Other Findings   Reproductive/Obstetrics (+) Pregnancy twin gestation Di/Di 34 weeks Transverse presentation ?Leaking amniotic fluid                           Anesthesia Physical Anesthesia Plan  ASA: II  Anesthesia Plan: Spinal   Post-op Pain Management:    Induction:   Airway Management Planned: Natural Airway  Additional Equipment:   Intra-op Plan:   Post-operative Plan:   Informed Consent: I have reviewed the patients History and Physical, chart, labs and discussed the procedure including the risks, benefits and alternatives for the proposed anesthesia with the patient or authorized representative who has indicated his/her understanding and acceptance.     Plan Discussed with: Anesthesiologist, CRNA and Surgeon  Anesthesia Plan Comments:         Anesthesia Quick Evaluation

## 2014-12-09 NOTE — Lactation Note (Signed)
This note was copied from the chart of Sierra Hensley. Lactation Consultation Note  Patient Name: Sierra Hensley Today's Date: 12/09/2014 Reason for consult: Initial assessment;Multiple gestation;Infant < 6lbs;Late preterm infant Pt. Nephew present to interpret. Mom reports she is pumping every 3 hours on preemie setting, not receiving any BM yet. Demonstrated hand expression, no colostrum obtained. Reassured Mom this is normal. NICU booklet given to Mom and reviewed with Mom normal amounts that she would be receiving from Birth to 3 days. Reviewed with Mom importance of pumping. Advised to take any BM she receives to NICU for babies. Advised to apply yellow stickers. Demonstrated to family how to clean pump pieces. Mom reports she has WIC, referral form faxed to [place] office of WIC. Advised of loaner program. Encouraged to call for questions/concerns.   Maternal Data    Feeding    LATCH Score/Interventions                      Lactation Tools Discussed/Used Tools: Pump Breast pump type: Double-Electric Breast Pump WIC Program: Yes   Consult Status Consult Status: Follow-up Date: 12/10/14 Follow-up type: In-patient    Sierra Hensley 12/09/2014, 10:59 PM    

## 2014-12-09 NOTE — Progress Notes (Signed)
[redacted]W[redacted]D, Twins, PPROM Doing ok, ready for c-section Afeb, VSS All CBG normal FHT- reactive x 2 Discussed c-section procedure and risks with pt via interpreter yesterday.  Nothing new today, will proceed with c-section for transverse/transverse twins.

## 2014-12-09 NOTE — Anesthesia Procedure Notes (Signed)
Spinal Patient location during procedure: OR Start time: 12/09/2014 7:39 AM Staffing Anesthesiologist: Gradie Butrick A. Performed by: anesthesiologist  Preanesthetic Checklist Completed: patient identified, site marked, surgical consent, pre-op evaluation, timeout performed, IV checked, risks and benefits discussed and monitors and equipment checked Spinal Block Patient position: sitting Prep: site prepped and draped and DuraPrep Patient monitoring: heart rate, cardiac monitor, continuous pulse ox and blood pressure Approach: midline Location: L3-4 Injection technique: single-shot Needle Needle type: Sprotte  Needle gauge: 24 G Needle length: 9 cm Needle insertion depth: 5.5 cm Assessment Sensory level: T4 Additional Notes Patient tolerated procedure well. Adequate sensory level.

## 2014-12-09 NOTE — Anesthesia Postprocedure Evaluation (Signed)
Anesthesia Post Note  Patient: Sierra Hensley  Procedure(s) Performed: Procedure(s) (LRB): CESAREAN SECTION MULTI-GESTATIONAL (N/A)  Anesthesia type: Spinal  Patient location: PACU  Post pain: Pain level controlled  Post assessment: Post-op Vital signs reviewed  Last Vitals:  Filed Vitals:   12/09/14 0915  BP: 112/70  Pulse: 94  Temp:   Resp: 22    Post vital signs: Reviewed  Level of consciousness: awake  Complications: No apparent anesthesia complications

## 2014-12-09 NOTE — Progress Notes (Signed)
Language line used for admission paperwork. All questions answered about plan of care.

## 2014-12-09 NOTE — Transfer of Care (Signed)
Immediate Anesthesia Transfer of Care Note  Patient: Sierra PattenKim T Cooley  Procedure(s) Performed: Procedure(s): CESAREAN SECTION MULTI-GESTATIONAL (N/A)  Patient Location: PACU  Anesthesia Type:Spinal  Level of Consciousness: awake, alert  and oriented  Airway & Oxygen Therapy: Patient Spontanous Breathing  Post-op Assessment: Report given to PACU RN and Post -op Vital signs reviewed and stable  Post vital signs: Reviewed and stable  Complications: No apparent anesthesia complications

## 2014-12-09 NOTE — Op Note (Signed)
Preoperative diagnosis:  Di/Di Twin Intrauterine pregnancy at 34 weeks, PPROM, Gestational Diabetes Postoperative diagnosis: Same with breech/breech presentations Procedure: Primary low transverse cesarean section without extensions Surgeon: Lavina Hammanodd Nadav Swindell M.D. Assistant:  Tracey Harrieshomas Henley, MD Anesthesia: Spinal  Findings: Patient had normal gravid anatomy with a few adhesions around the left ovary, Twin A was a viable female infant in breech presentation with Apgars of 8 and 9, 3 lbs 10 oz, Baby B viable female infant, breech presentation, Apgars 8 and 9, 4 lbs 9 oz. Estimated blood loss: 1000 cc Specimens: Placentas for routine pathology Complications: None  Procedure in detail: The patient was taken to the operating room and placed in the sitting position. Dr. Malen GauzeFoster instilled spinal anesthesia.  She was then placed in the dorsosupine position with left tilt. Abdomen was then prepped and draped in the usual sterile fashion, and a foley catheter was inserted. The level of her anesthesia was found to be adequate. Abdomen was entered via a standard Pfannenstiel incision. Once the peritoneal cavity was entered the Alexis disposable self-retaining retractor was placed and good visualization was achieved. A 4 cm transverse incision was then made in the lower uterine segment pushing the bladder inferior. Once the uterine cavity was entered the incision was extended digitally. Baby A was in breech presentation, easily delivered.  Cord clamped and cut and baby taken to NICU team.  Bulging membranes for Baby B ruptured with an Allis clamp, baby also in compound breech presentation.  Baby easily delivered.  Cord clamped and cut and baby taken to NICU team.  Cord blood collected from both cords, placentas delivered spontaneously. Uterus was wiped dry with clean lap pad and all clots and debris were removed. Uterine incision was inspected and found to be free of extensions. Uterine incision was closed in 2 layers with  running locking #1 Chromic for the first layer, an imbricating layer with #1 Chromic for the second layer. Tubes and ovaries were inspected and found to be normal. Uterine incision was inspected and found to be hemostatic, a small hematoma at the right angle stable. Bleeding from serosal edges was controlled with electrocautery. The Alexis retractor was removed. Subfascial space was irrigated and made hemostatic with electrocautery. Peritoneum was closed with 2-0 Vicryl.  Fascia was closed in running fashion starting at both ends and meeting in the middle with 0 Vicryl. Subcutaneous tissue was then irrigated and made hemostatic with electrocautery, then closed with running 2-0 plain gut. Skin was closed with running 4-0 Vicryl subcuticular suture followed by steri-strips and a sterile dressing. Patient tolerated the procedure well and was taken to the recovery in stable condition. Counts were correct x2, she received Ancef 2 g IV at the beginning of the procedure and she had PAS hose on throughout the procedure.

## 2014-12-10 LAB — CBC
HCT: 28.2 % — ABNORMAL LOW (ref 36.0–46.0)
HEMOGLOBIN: 9.4 g/dL — AB (ref 12.0–15.0)
MCH: 30.3 pg (ref 26.0–34.0)
MCHC: 33.3 g/dL (ref 30.0–36.0)
MCV: 91 fL (ref 78.0–100.0)
Platelets: 170 10*3/uL (ref 150–400)
RBC: 3.1 MIL/uL — ABNORMAL LOW (ref 3.87–5.11)
RDW: 14.2 % (ref 11.5–15.5)
WBC: 9.3 10*3/uL (ref 4.0–10.5)

## 2014-12-10 NOTE — Anesthesia Postprocedure Evaluation (Signed)
  Anesthesia Post-op Note  Patient: Alfonso PattenKim T Wageman  Procedure(s) Performed: Procedure(s): CESAREAN SECTION MULTI-GESTATIONAL (N/A)  Patient Location: Mother/Baby  Anesthesia Type:Spinal  Level of Consciousness: awake, alert , oriented and patient cooperative  Airway and Oxygen Therapy: Patient Spontanous Breathing  Post-op Pain: mild  Post-op Assessment: Post-op Vital signs reviewed, Patient's Cardiovascular Status Stable, Respiratory Function Stable, Patent Airway, No headache, No backache, No residual numbness and No residual motor weakness  Post-op Vital Signs: Reviewed and stable  Last Vitals:  Filed Vitals:   12/10/14 0750  BP: 139/75  Pulse: 77  Temp:   Resp: 18    Complications: No apparent anesthesia complications

## 2014-12-10 NOTE — Addendum Note (Signed)
Addendum  created 12/10/14 0853 by Yolonda KidaAlison L Kaylene Dawn, CRNA   Modules edited: Notes Section   Notes Section:  File: [id number]

## 2014-12-10 NOTE — Progress Notes (Signed)
Subjective: Postpartum Day #1: Cesarean Delivery Patient reports incisional pain, tolerating PO and no problems voiding.  Babies stable in NICU.  Objective: Vital signs in last 24 hours: Temp:  [97.7 F (36.5 C)-98.9 F (37.2 C)] 98.6 F (37 C) (01/13 0340) Pulse Rate:  [70-94] 77 (01/13 0750) Resp:  [16-30] 18 (01/13 0750) BP: (112-139)/(62-87) 139/75 mmHg (01/13 0750) SpO2:  [96 %-100 %] 98 % (01/13 0750)  Physical Exam:  General: alert Lochia: appropriate Uterine Fundus: firm Incision: dressing C/D/I    Recent Labs  12/09/14 0546 12/10/14 0708  HGB 11.6* 9.4*  HCT 35.7* 28.2*    Assessment/Plan: Status post Cesarean section. Doing well postoperatively.  Continue current care, ambulate, work on pumping.  Brenson Hartman D 12/10/2014, 8:28 AM

## 2014-12-11 ENCOUNTER — Encounter (HOSPITAL_COMMUNITY): Payer: Self-pay | Admitting: Obstetrics and Gynecology

## 2014-12-11 NOTE — Progress Notes (Signed)
POD #2 Doing ok, sore, babies stable Afeb, VSS Abd- soft, fundus firm, incision intact Continue routine care

## 2014-12-12 ENCOUNTER — Ambulatory Visit: Payer: Self-pay

## 2014-12-12 MED ORDER — PRENATABS RX 29-1 MG PO TABS: 1.0000 | ORAL_TABLET | Freq: Every morning | ORAL | Status: DC

## 2014-12-12 MED ORDER — IBUPROFEN 800 MG PO TABS: 800.0000 mg | ORAL_TABLET | Freq: Three times a day (TID) | ORAL | Status: DC | PRN

## 2014-12-12 MED ORDER — OXYCODONE-ACETAMINOPHEN 5-325 MG PO TABS: 1.0000 | ORAL_TABLET | Freq: Four times a day (QID) | ORAL | Status: DC | PRN

## 2014-12-12 MED ORDER — BISACODYL 10 MG RE SUPP
10.0000 mg | Freq: Every day | RECTAL | Status: DC | PRN
  Administered 2014-12-12: 10 mg via RECTAL
  Filled 2014-12-12: qty 1

## 2014-12-12 NOTE — Discharge Summary (Signed)
Obstetric Discharge Summary Reason for Admission: PPROM Prenatal Procedures: NST and BMZ, antenatal admission Intrapartum Procedures: cesarean: low cervical, transverse Postpartum Procedures: none Complications-Operative and Postpartum: none HEMOGLOBIN  Date Value Ref Range Status  12/10/2014 9.4* 12.0 - 15.0 g/dL Final    Comment:    REPEATED TO VERIFY DELTA CHECK NOTED    HCT  Date Value Ref Range Status  12/10/2014 28.2* 36.0 - 46.0 % Final    Physical Exam:  General: alert and no distress Lochia: appropriate Uterine Fundus: firm Incision: healing well DVT Evaluation: No evidence of DVT seen on physical exam.  Discharge Diagnoses: preterm delivery of twins after PPROM by LTCS  Discharge Information: Date: 12/12/2014 Activity: pelvic rest Diet: routine Medications: PNV, Ibuprofen and Percocet Condition: stable Instructions: refer to practice specific booklet Discharge to: home Follow-up Information    Follow up with MEISINGER,TODD D, MD. Schedule an appointment as soon as possible for a visit in 2 weeks.   Specialty:  Obstetrics and Gynecology   Why:  for incision check, 6 weeks for full postpartum   Contact information:   [address], [address] [place] [id number] [phone]       Newborn Data:   Sierra Hensley, BoyA Solash [[id number]][[id number]]  Live born female  Birth Weight: 3 lb 10.2 oz (1650 g) APGAR: 8, 9   Sierra Hensley, GirlB Sierra Hensley [[id number]][[id number]]  Live born female  Birth Weight: 4 lb 9.7 oz (2090 g) APGAR: 8, 9  Home with mother.  [name], Sierra Hensley 12/12/2014, 7:58 AM

## 2014-12-12 NOTE — Progress Notes (Signed)
Subjective: Postpartum Day 3: Cesarean Delivery Patient reports incisional pain and tolerating PO. Nl lochia, pain controlled.    Objective: Vital signs in last 24 hours: Temp:  [98.4 F (36.9 C)-98.5 F (36.9 C)] 98.4 F (36.9 C) (01/15 0515) Pulse Rate:  [81-84] 84 (01/15 0515) Resp:  [18-20] 18 (01/15 0515) BP: (123-137)/(78-84) 137/78 mmHg (01/15 0515) SpO2:  [97 %] 97 % (01/15 0515)  Physical Exam:  General: alert and no distress Lochia: appropriate Uterine Fundus: firm    Recent Labs  12/10/14 0708  HGB 9.4*  HCT 28.2*    Assessment/Plan: Status post Cesarean section. Doing well postoperatively.  Discharge home with standard precautions and return to clinic in 2 weeks.  D/C with Motrin, percocet and PNV.    Hensley, Sierra Avetisyan 12/12/2014, 7:31 AM

## 2014-12-12 NOTE — Lactation Note (Signed)
This note was copied from the chart of [name]. Lactation Consultation Note  Follow up visit made prior to mother's discharge.  Instructed to continue to pump both breasts every 3 hours and expect for volume to increase in next few days.  WIC pump loaner completed.  Encouraged to call with concerns.  Patient Name: Sierra Hensley Sierra Hensley Today's Date: 12/12/2014     Maternal Data    Feeding Feeding Type: Formula Length of feed: 30 min  LATCH Score/Interventions                      Lactation Tools Discussed/Used     Consult Status      Huston FoleyMOULDEN, Jameyah Fennewald S 12/12/2014, 5:51 PM

## 2014-12-26 ENCOUNTER — Encounter (HOSPITAL_COMMUNITY)
Admission: RE | Admit: 2014-12-26 | Discharge: 2014-12-26 | Disposition: A | Discharge status: Home / Self Care | Payer: Medicaid Other | Source: Ambulatory Visit | Attending: Obstetrics and Gynecology | Admitting: Obstetrics and Gynecology

## 2014-12-26 DIAGNOSIS — O923 Agalactia: Secondary | ICD-10-CM | POA: Diagnosis present

## 2015-01-26 ENCOUNTER — Encounter (HOSPITAL_COMMUNITY)
Admission: RE | Admit: 2015-01-26 | Discharge: 2015-01-26 | Disposition: A | Discharge status: Home / Self Care | Payer: Medicaid Other | Source: Ambulatory Visit | Attending: Obstetrics and Gynecology | Admitting: Obstetrics and Gynecology

## 2015-01-26 DIAGNOSIS — O923 Agalactia: Secondary | ICD-10-CM | POA: Diagnosis present

## 2015-02-25 ENCOUNTER — Encounter (HOSPITAL_COMMUNITY)
Admission: RE | Admit: 2015-02-25 | Discharge: 2015-02-25 | Disposition: A | Discharge status: Home / Self Care | Payer: Medicaid Other | Source: Ambulatory Visit | Attending: Obstetrics and Gynecology | Admitting: Obstetrics and Gynecology

## 2015-02-25 DIAGNOSIS — O923 Agalactia: Secondary | ICD-10-CM | POA: Diagnosis not present

## 2015-03-28 ENCOUNTER — Encounter (HOSPITAL_COMMUNITY)
Admission: RE | Admit: 2015-03-28 | Discharge: 2015-03-28 | Disposition: A | Discharge status: Home / Self Care | Payer: Medicaid Other | Source: Ambulatory Visit | Attending: Obstetrics and Gynecology | Admitting: Obstetrics and Gynecology

## 2015-03-28 DIAGNOSIS — O923 Agalactia: Secondary | ICD-10-CM | POA: Diagnosis not present

## 2015-04-28 ENCOUNTER — Encounter (HOSPITAL_COMMUNITY)
Admission: RE | Admit: 2015-04-28 | Discharge: 2015-04-28 | Disposition: A | Discharge status: Home / Self Care | Payer: Medicaid Other | Source: Ambulatory Visit | Attending: Obstetrics and Gynecology | Admitting: Obstetrics and Gynecology

## 2015-04-28 DIAGNOSIS — O923 Agalactia: Secondary | ICD-10-CM | POA: Insufficient documentation

## 2016-08-02 ENCOUNTER — Other Ambulatory Visit: Payer: Self-pay | Admitting: Gastroenterology

## 2016-08-02 DIAGNOSIS — K219 Gastro-esophageal reflux disease without esophagitis: Secondary | ICD-10-CM

## 2016-08-09 ENCOUNTER — Other Ambulatory Visit: Payer: Medicaid Other

## 2016-08-11 ENCOUNTER — Ambulatory Visit
Admission: RE | Admit: 2016-08-11 | Discharge: 2016-08-11 | Disposition: A | Discharge status: Home / Self Care | Payer: BLUE CROSS/BLUE SHIELD | Source: Ambulatory Visit | Attending: Gastroenterology | Admitting: Gastroenterology

## 2016-08-11 DIAGNOSIS — K219 Gastro-esophageal reflux disease without esophagitis: Secondary | ICD-10-CM

## 2018-05-15 ENCOUNTER — Ambulatory Visit: Payer: BLUE CROSS/BLUE SHIELD | Admitting: Family Medicine

## 2018-05-15 ENCOUNTER — Other Ambulatory Visit: Payer: Self-pay

## 2018-05-15 ENCOUNTER — Encounter: Payer: Self-pay | Admitting: Family Medicine

## 2018-05-15 VITALS — BP 102/60 | HR 88 | Temp 98.6°F | Ht 64.57 in | Wt 133.6 lb

## 2018-05-15 DIAGNOSIS — Z130 Encounter for screening for diseases of the blood and blood-forming organs and certain disorders involving the immune mechanism: Secondary | ICD-10-CM

## 2018-05-15 DIAGNOSIS — Z13220 Encounter for screening for lipoid disorders: Secondary | ICD-10-CM

## 2018-05-15 DIAGNOSIS — Z13228 Encounter for screening for other metabolic disorders: Secondary | ICD-10-CM

## 2018-05-15 DIAGNOSIS — Z8632 Personal history of gestational diabetes: Secondary | ICD-10-CM | POA: Insufficient documentation

## 2018-05-15 DIAGNOSIS — Z0000 Encounter for general adult medical examination without abnormal findings: Secondary | ICD-10-CM | POA: Diagnosis not present

## 2018-05-15 DIAGNOSIS — K219 Gastro-esophageal reflux disease without esophagitis: Secondary | ICD-10-CM | POA: Insufficient documentation

## 2018-05-15 DIAGNOSIS — Z1329 Encounter for screening for other suspected endocrine disorder: Secondary | ICD-10-CM

## 2018-05-15 MED ORDER — RANITIDINE HCL 150 MG PO TABS: 150.0000 mg | ORAL_TABLET | Freq: Two times a day (BID) | ORAL | 5 refills | Status: DC

## 2018-05-15 MED ORDER — FLUTICASONE PROPIONATE 50 MCG/ACT NA SUSP: 1.0000 | Freq: Two times a day (BID) | NASAL | 6 refills | Status: DC

## 2018-05-15 MED ORDER — CETIRIZINE HCL 10 MG PO TABS
10.0000 mg | ORAL_TABLET | Freq: Every day | ORAL | 11 refills | Status: DC
Start: 2018-05-15 — End: 2018-07-26

## 2018-05-15 NOTE — Addendum Note (Signed)
Addended by: Myles LippsSANTIAGO, Ebubechukwu Jedlicka M on: 05/15/2018 09:46 AM   Modules accepted: Orders

## 2018-05-15 NOTE — Progress Notes (Signed)
6/18/20199:09 AM  Sierra Hensley 08-Jul-1971, 47 y.o. female [id number]  No chief complaint on file.   HPI:   Patient is a 47 y.o. female with past medical history significant for h/o GDM2 and GERD who presents today for CPE  Last CPE 04/2017 Last pap 2017, normal Mammogram scheduled for aug 2019 STI testing with last pregnancy in 2016 Tdap during pregnancy 2016 [id number] Menses are regular Not on [facility], declines today Has appt with optho in Sept Does dental care in [place] at least once a year  Has no acute concerns today   Fall Risk  05/15/2018 11/06/2014  Falls in the past year? No No     Depression screen [facility] 2/9 05/15/2018 11/06/2014  Decreased Interest 0 0  Down, Depressed, Hopeless 0 0  PHQ - 2 Score 0 0    No Known Allergies  Prior to Admission medications   Medication Sig Start Date End Date Taking? Authorizing Provider  ibuprofen (ADVIL,MOTRIN) 800 MG tablet Take 1 tablet (800 mg total) by mouth every 8 (eight) hours as needed. 12/12/14  Yes [name], Jeral Fruit, MD    Past Medical History:  Diagnosis Date  . 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 CESAREAN SECTION MULTI-GESTATIONAL N/A 12/09/2014   Procedure: CESAREAN SECTION MULTI-GESTATIONAL;  Surgeon: Cheri Fowler, MD;  Location: [facility];  Service: Obstetrics;  Laterality: N/A;    Social History   Tobacco Use  . Smoking status: Never Smoker  . Smokeless tobacco: Never Used  Substance Use Topics  . Alcohol use: No    History reviewed. No pertinent family history.  Review of Systems  Constitutional: Negative for chills, fever, malaise/fatigue and weight loss.  Respiratory: Negative for cough and shortness of breath.   Cardiovascular: Negative for chest pain, palpitations and leg swelling.  Gastrointestinal: Positive for heartburn. Negative for abdominal pain, blood in stool, constipation, diarrhea, melena, nausea and vomiting.    Genitourinary: Negative for dysuria and hematuria.       Neg breast lumps or nipple discharge Neg vaginal discharge, pelvic pain, dyspareunia, abnormal vaginal bleeding  Endo/Heme/Allergies: Negative for polydipsia.  Psychiatric/Behavioral: Negative for depression. The patient is not nervous/anxious.   All other systems reviewed and are negative.    OBJECTIVE:  Blood pressure 102/60, pulse 88, temperature 98.6 F (37 C), temperature source Oral, height 5' 4.57" (1.64 m), weight 133 lb 9.6 oz (60.6 kg), last menstrual period 03/28/2018, SpO2 98 %, unknown if currently breastfeeding.  Physical Exam  Constitutional: She is oriented to person, place, and time. She appears well-developed and well-nourished.  HENT:  Head: Normocephalic and atraumatic.  Right Ear: Hearing, tympanic membrane, external ear and ear canal normal.  Left Ear: Hearing, tympanic membrane, external ear and ear canal normal.  Mouth/Throat: Oropharynx is clear and moist.  Eyes: Pupils are equal, round, and reactive to light. Conjunctivae and EOM are normal.  Neck: Neck supple. No thyromegaly present.  Cardiovascular: Normal rate, regular rhythm, normal heart sounds and intact distal pulses. Exam reveals no gallop and no friction rub.  No murmur heard. Pulmonary/Chest: Effort normal and breath sounds normal. She has no wheezes. She has no rales.  Abdominal: Soft. Bowel sounds are normal. She exhibits no distension and no mass. There is no tenderness.  Musculoskeletal: Normal range of motion. She exhibits no edema.  Lymphadenopathy:    She has no cervical adenopathy.  Neurological: She is alert and oriented to person, place, and time. She has  normal reflexes. No cranial nerve deficit. Gait normal.  Skin: Skin is warm and dry.  Psychiatric: She has a normal mood and affect.  Nursing note and vitals reviewed.   ASSESSMENT and PLAN  1. Annual physical exam No concerns per history or exam. Routine HCM labs ordered.  HCM reviewed/discussed. Anticipatory guidance regarding healthy weight, lifestyle and choices given.   2. Screening for lipid disorders - Lipid panel - CMP14+EGFR  3. Screening for metabolic disorder  4. Screening for thyroid disorder - TSH  5. Screening for deficiency anemia - CBC with Differential/Platelet  6. History of gestational diabetes - Hemoglobin A1c  7. Gastroesophageal reflux disease, esophagitis presence not specified Discussed supportive measures, new meds r/se/b and RTC precautions. Patient educational handout given. - ranitidine (ZANTAC) 150 MG tablet; Take 1 tablet (150 mg total) by mouth 2 (two) times daily.  Return in about 1 year (around 05/16/2019).    Rutherford Guys, MD [facility] at [place], [place] [address] Ph.  [phone] Fax [phone]

## 2018-05-15 NOTE — Patient Instructions (Addendum)
IF you received an x-ray today, you will receive an invoice from [facility]. Please contact [facility] at [phone] with questions or concerns regarding your invoice.   IF you received labwork today, you will receive an invoice from [facility]. Please contact [facility] at [phone] with questions or concerns regarding your invoice.   Our billing staff will not be able to assist you with questions regarding bills from these companies.  You will be contacted with the lab results as soon as they are available. The fastest way to get your results is to activate your My Chart account. Instructions are located on the last page of this paperwork. If you have not heard from [place] regarding the results in 2 weeks, please contact this office.     L?a Ch?n Th?c ?n cho B?nh Tro Ng??c D? Dy Th?c Qu?n, Ng??i L?n Food Choices for Gastroesophageal Reflux Disease, Adult Khi qu v? b? b?nh tro ng??c d? dy th?c qu?n (GERD), th?c ph?m qu v? ?n v thi quen ?n u?ng c?a qu v? l r?t quan tr?ng. L?a ch?n th?c ph?m ?ng c th? gip lm gi?m s? kh ch?u c?a b?nh tro ng??c d? dy th?c qu?n (GERD). Cn nh?c lm vi?c v?i m?t chuyn gia v? ch? ?? ?n v dinh d??ng (chuyn gia dinh d??ng) nh?m gip qu v? l?a ch?n nh?ng th?c ph?m t?t cho s?c kh?e. Nh?ng h??ng d?n chung no ti ph?i tun theo? K? ho?ch ?n u?ng  Ch?n nh?ng th?c ph?m t?t cho s?c kh?e t ch?t bo, ch?ng h?n nh? tri cy, rau c?, ng? c?c nguyn h?t, cc s?n ph?m t? s?a t bo, v th?t n?c, c, v th?t gia c?m.  ?n cc b?a nh?, th??ng xuyn thay v ba b?a l?n m?i ngy. ?n ch?m ri, trong khng gian th? gin. Trnh g?p ng??i ho?c n?m xu?ng cho ??n khi ?n xong ???c 2-3 gi?.  H?n ch? cc th?c ph?m giu ch?t bo ch?ng h?n nh? th?t nhi?u m? ho?c th?c ph?m chin.  Gi?i h?n l??ng tiu th? d?u, b?, v ch?t bo t? d?u th?c v?t ? m?c d??i 8 mu?ng c ph m?i ngy.  Tra?nh nh??ng th?? sau: ? Cc th?c ph?m gy ra cc tri?u ch?ng. Nh?ng  tri?u ch?ng ny c th? khc nhau ??i v?i nh?ng ng??i khc nhau. Hy ghi nh?t k th?c ph?m ?? theo di nh?ng th?c ph?m no gy ra cc tri?u ch?ng. ? R??u. ? U?ng nhi?u ch?t l?ng khi ?n. ? ?n trong kho?ng 2-3 gi? tr??c khi ?i ng?.  N?u ?n b?ng cc ph??ng php khc thay vi? chin. Ph??ng php ny c th? bao g?m b? l, n??ng, ho?c hun nng. L?i s?ng   Duy tr m??c cn n?ng c l?i cho s?c kh?e. H?i chuyn gia ch?m [place] s?c kh?e c?a quy? vi? xem m??c cn n??ng na?o la? t?t cho quy? vi?. N?u qu v? c?n gi?m cn, hy trao ??i v?i chuyn gia ch?m [place] s?c kh?e c?a qu v? ?? gi?m cn m?t cch an ton.  T?p th? d?c t?i thi?u 30 pht t? 5 ngy tr? ln m?i tu?n, ho?c theo ch? d?n c?a chuyn gia ch?m [place] s?c kh?e.  Trnh m?c qu?n o b ch?t quanh th?t l?ng v ng?c.  Khng s? d?ng b?t k? s?n ph?m no ch?a nicotine ho?c thu?c l, ch?ng ha?n nh? thu?c l d?ng ht v thu?c l ?i?n t?. N?u qu v? c?n gip ?? ?? cai thu?c, hy h?i chuyn gia ch?m [place] s?c kh?e.  Ng? v?i ??u gi??ng k cao. S? d?ng chm d??i n?m ho?c t?m k d??i khung gi??ng ?? nng ??u gi??ng ln. Nh?ng lo?i th?c ?n no khng ???c khuyn dng? Nh?ng m?c ???c li?t k c th? khng ph?i danh sch ??y ??. Hy trao ??i v?i bc s? chuyn khoa dinh d??ng xem cc l?a ch?n ch? ?? dinh d??ng no ph h?p nh?t v?i qu v?. Ng? c?c Bnh ng?t ho?c bnh m n??ng nhanh b? sung ch?t bo. Bnh m n??ng c?a Php. Rau c? Marlou Starks c? chin ng?p d?u. Khoai ty chin. M?i lo?i rau c? ???c ch? bi?n km thm ch?t bo. M?i lo?i rau c? gy ra cc tri?u ch?ng. ??i v?i m?t s? ng??i, nh?ng lo?i rau c? ny c th? bao g?m c chua v cc s?n ph?m t? c chua, ?t, hnh v t?i, v c?i ng?a. Tri cy M?i lo?i tri cy ???c ch? bi?n km thm ch?t bo. M?i lo?i tri cy c th? gy ra cc tri?u ch?ng. ??i v?i m?t s? ng??i, nh?ng lo?i tri cy ny c th? bao g?m tri cy h? cam qut, ch?ng h?n nh? cam, b??i, qu? d?a, v chanh. Th?t v cc th?c ph?m ch?a protein khc Th?t giu ch?t bo, ch?ng  h?n nh? th?t l?n ho?c th?t b nhi?u m?, xc xch, s??n, gi?m bng, l?p x??ng, [place] v th?t l?n mu?i xng khi. Th?t chin ho?c protein, bao g?m c chin v th?t g chin. Qu? h?ch v b? h?t. S?a. S?a nguyn kem v s?a s-c-la. Kem chuaSalome Spotted. Kem. Pho mt kem. S?a l?c. ?? u?ng C ph v tr, c ho?c khng c caffeine. ?? u?ng c ga. Soda. ?? u?ng t?ng l?c. N??c p tri cy lm t? tri cy chua (ch?ng h?n nh? cam ho?c b??i). N??c p c chua. ?? u?ng ch?a c?n. M? v d?u B?. B? th?c v?t. Ch?t bo t? d?u th?c v?t. B? s??a tru. K?o v ?? trng mi?ng S-c-la v c ca. Bnh rnCassell Smiles v? v cc th?c ph?m khc. H?t tiu. B?c h v b?c h l?c. M?i lo?i gia v?, th?o d??c, ho?c gia v? gy ra cc tri?u ch?ng. ??i v?i m?t s? ng??i, gia v? v cc th?c ph?m khc c th? bao g?m c ri, n??c s?t nng, ho?c d?u d?m ?? tr?n salad. Tm t?t  Khi qu v? b? b?nh tro ng??c d? dy th?c qu?n (GERD), cc l?a ch?n th?c ph?m v l?i s?ng c vai tr r?t quan tr?ng ?? gip lm gi?m s? kh ch?u c?a GERD.  ?n cc b?a nh?, th??ng xuyn thay v ba b?a l?n m?i ngy. ?n ch?m ri, trong khng gian th? gin. Trnh g?p ng??i ho?c n?m xu?ng cho ??n khi ?n xong ???c 2-3 gi?.  H?n ch? cc th?c ph?m giu ch?t bo, ch?ng h?n nh? th?t m? ho?c th?c ph?m chin. Thng tin ny khng nh?m m?c ?ch thay th? cho l?i khuyn m chuyn gia ch?m [place] s?c kh?e ni v?i qu v?. Hy b?o ??m qu v? ph?i th?o lu?n b?t k? v?n ?? g m qu v? c v?i chuyn gia ch?m [place] s?c kh?e c?a qu v?. Document Released: 03/03/2017 Document Revised: 03/03/2017 Elsevier Interactive Patient Education  2018 [employer].  Ch?m [place] phng ng?a 40-64 tu?i, N? gi?i Preventive Care 40-64 Years, Female Ch?m [place] phng ng?a ?? c?p ??n cc l?a ch?n l?i s?ng v th?m khm v?i chuyn gia ch?m [place] s?c kh?e c th? t?ng c??ng s?c kh?e v h?nh phc. Ch?m [place] phng ng?a  bao g?m nh?ng g?  Khm s?c kh?e hng n?m. L?n khm ny c?ng ???c g?i l ki?m tra s?c kh?e hng n?m.  Khm nha  khoa m?i n?m m?t ho?c hai l?n.  Khm m?t ??nh k?. Hy h?i chuyn gia ch?m [place] s?c kh?e v? vi?c bao lu th qu v? nn khm m?t m?t l?n.  Cc l?a ch?n l?i s?ng c nhn, bao g?m: ? Ch?m [place] r?ng v l?i hng ngy. ? Hoa?t ??ng th? ch?t th???ng xuyn. ? ?n ch? ?? ?n lnh m?nh. ? Hessie Diener s? d?ng thu?c l v ma ty. ? H?n ch? s? d?ng r??u. ? Quan h? tnh d?c an ton. ? Dng aspirin li?u th?p hng ngy b?t ??u ? tu?i 50. ? Dng th?c ph?m b? sung vitamin v ch?t khong theo ch? d?n c?a chuyn gia ch?m [place] s?c kh?e. Nh?ng g x?y ra trong khi ki?m tra s?c kh?e hng n?m? Cc d?ch v? v xt nghi?m sng l?c do chuyn gia ch?m [place] s?c kh?e th?c hi?n trong khi khm s?c kh?e hng n?m s? ph? thu?c vo tu?i, s?c kh?e t?ng th?, cc y?u t? nguy c? t? l?i s?ng v b?nh s? gia ?nh c?a qu v?. T? v?n Chuyn gia ch?m [place] s?c kh?e c th? h?i qu v? v? vi?c:  S? d?ng r??u.  S? d?ng thu?c l.  S? d?ng ma ty.  Tnh tr?ng s?c kh?e tinh th?n.  Tnh tr?ng h?nh phc ? nh v trong m?i quan h?.  Quan h? tnh d?c.  Thi quen ?n u?ng.  Cng vi?c v mi tr??ng lm vi?c.  Ph??ng php ng?a [place].  Chu k? kinh nguy?t.  L?ch s? mang thai.  Sng l?c Qu v? c th? ???c lm cc xt nghi?m ho?c php ?o sau:  Chi?u cao, cn n?ng v BMI.  Huy?t p.  N?ng ?? lipid v cholesterol. L??ng cc ch?t ny c th? ???c ki?m tra m?i 5 n?m m?t l?n ho?c th??ng xuyn h?n n?u qu v? trn [place].  Sng l?c ung th? ph?i. Qu v? c th? ???c lm xt nghi?m sng l?c ny m?i n?m b?t ??u ? tu?i 55 n?u qu v? c ti?n s? ht 30 gi thu?c m?t n?m v hi?n ?ang ht thu?c ho?c ? b? thu?c trong vng 15 n?m tr??c.  Xt nghi?m mu ?n trong phn (FOBT). Qu v? c th? ???c lm xt nghi?m ny m?i n?m b?t ??u ? tu?i 50.  N?i soi ??i trng ho?c n?i soi ??i trng sigma ?ng m?m. Qu v? c th? ???c n?i soi ??i trng sigma m?i 5 n?m m?t l?n ho?c n?i soi ??i trng m?i 10 n?m m?t l?n b?t ??u ? tu?i 50.  Xt nghi?m mu tm vim gan  C.  Xt nghi?m mu tm vim gan B.  Xt nghi?m b?nh ly truy?n qua ???ng tnh d?c (STD).  Sng l?c ti?u ???ng. Xt nghi?m ny ???c th?c hi?n b?ng cch ki?m tra ???ng huy?t (glucose) sau khi qu v? khng ?n g trong m?t kho?ng th?i gian (nh?n ?i). Qu v? c th? ???c lm xt nghi?m ny m?i 1-3 n?m m?t l?n.  Ch?p X quang tuy?n v. Xt nghi?m ny c th? ???c th?c hi?n m?i 1-2 n?m m?t l?n. Hy trao ??i v?i chuyn gia ch?m  s?c kh?e v? vi?c khi no qu v? nn b?t ??u ch?p X quang tuy?n v hng n?m. ?i?u ny c th? ph? thu?c vo vi?c qu v? c ti?n s? gia ?nh b? ung th?  v hay khng.  Sng l?c ung th? lin quan ??n BRCA. Xt nghi?m ny c th? ???c th?c hi?n n?u qu v? c ti?n s? gia ?nh b? ung th? v, bu?ng tr?ng, ?ng d?n tr?ng ho?c ung th? phc m?c.  Khm khung ch?u v xt nghi?m ph?t t? bo c? t? cung (Pap). Nh?ng xt nghi?m ny c th? ???c th?c hi?n m?i 3 n?m m?t l?n b?t ??u t? tu?i 21. B?t ??u ? tu?i 30, xt nghi?m ny c th? ???c th?c hi?n m?i 5 n?m m?t l?n n?u qu v? ???c lm xt nghi?m Pap k?t h?p v?i xt nghi?m HPV.  Ch?p m?t ?? x??ng. Xt nghi?m ny ???c th?c hi?n ?? sng l?c b?nh long x??ng. Qu v? c th? ???c ch?p m?t ?? x??ng n?u c nguy c? cao b? long x??ng.  Hy th?o lu?n v?i chuyn gia ch?m [place] s?c kh?e cu?a qu v? v? cc k?t qu? xt nghi?m, cc ph??ng n ?i?u tr? v n?u c?n, nhu c?u lm thm xt nghi?m. V?c xin [place] gia ch?m [place] s?c kh?e c th? khuy?n ngh? m?t s? v?c xin nh?t ??nh, ch?ng h?n nh?:  V?c xin cm. V?c xin ny ???c khuy?n ngh? hng n?m.  V?c xin u?n vn, b?ch h?u v ho g v bo (Tdap, Td). Qu v? c th? c?n m?t li?u Td nh?c l?i m?i 10 n?m m?t l?n.  V?c xin th?y ??u. Qu v? c th? c?n v?c xin ny n?u ch?a ???c chch ng?a.  V?c xin zoster. Qu v? c th? c?n v?c xin ny sau tu?i 60.  V?c xin s?i, quai b? v rubella (MMR). Qu v? c th? c?n t nh?t m?t li?u MMR n?u sinh ra vo n?m 1957 ho?c sau ?Sander Nephew v? c?ng c?n li?u th? hai.  V?c xin lin h?p ph? c?u khu?n  13 gi (PCV13). Qu v? c th? c?n v?c xin ny n?u c m?t s? tnh tr?ng b?nh l nh?t ??nh v tr??c ?y ch?a ???c chch ng?a.  V?c xin ph? c?u khu?n polysaccharide (PPSV23). Qu v? c th? c?n m?t ho?c hai li?u n?u qu v? ht thu?c ho?c n?u qu v? c m?t s? tnh tr?ng b?nh l nh?t ??nh.  V?c xin vim mng no. Qu v? c th? c?n v?c xin ny n?u c cc tnh tr?ng nh?t ??nh.  V?c xin vim gan A. Qu v? c th? c?n v?c xin ny n?u c m?t s? tnh tr?ng nh?t ??nh ho?c n?u qu v? ?i du l?ch ho?c lm vi?c ? nh?ng n?i m qu v? c th? b? ph?i nhi?m vim gan A.  V?c xin vim gan B. Qu v? c th? c?n v?c xin ny n?u c m?t s? tnh tr?ng b?nh l nh?t ??nh ho?c n?u qu v? ?i du l?ch ho?c lm vi?c ? nh?ng n?i m qu v? c th? b? ph?i nhi?m vim gan B.  V?c xin Haemophilus influenzae tup b (Hib). Qu v? c th? c?n v?c xin ny n?u c cc tnh tr?ng nh?t ??nh.  Hy trao ??i v?i chuyn gia ch?m [place] s?c kh?e v? nh?ng xt nghi?m sng l?c v v?c xin no m qu v? c?n v bao lu th c?n cc v?c xin ? m?t l?n. Thng tin ny khng nh?m m?c ?ch thay th? cho l?i khuyn m chuyn gia ch?m [place] s?c kh?e ni v?i qu v?. Hy b?o ??m qu v? ph?i th?o lu?n b?t k? v?n ?? g m qu v? c v?i chuyn gia ch?m [place] s?c kh?e c?a qu  v?. Document Released: 03/02/2017 Document Revised: 03/02/2017 Elsevier Interactive Patient Education  [employer].

## 2018-05-16 LAB — CMP14+EGFR
ALT: 17 IU/L (ref 0–32)
AST: 20 IU/L (ref 0–40)
Albumin/Globulin Ratio: 1.3 (ref 1.2–2.2)
Albumin: 4.1 g/dL (ref 3.5–5.5)
Alkaline Phosphatase: 51 IU/L (ref 39–117)
BUN/Creatinine Ratio: 16 (ref 9–23)
BUN: 10 mg/dL (ref 6–24)
Bilirubin Total: 0.4 mg/dL (ref 0.0–1.2)
CO2: 25 mmol/L (ref 20–29)
Calcium: 9.3 mg/dL (ref 8.7–10.2)
Chloride: 102 mmol/L (ref 96–106)
Creatinine, Ser: 0.64 mg/dL (ref 0.57–1.00)
GFR calc Af Amer: 124 mL/min/{1.73_m2} (ref >59)
GFR calc non Af Amer: 107 mL/min/{1.73_m2} (ref >59)
Globulin, Total: 3.1 g/dL (ref 1.5–4.5)
Glucose: 92 mg/dL (ref 65–99)
Potassium: 5 mmol/L (ref 3.5–5.2)
Sodium: 138 mmol/L (ref 134–144)
Total Protein: 7.2 g/dL (ref 6.0–8.5)

## 2018-05-16 LAB — CBC WITH DIFFERENTIAL/PLATELET
Basophils Absolute: 0 10*3/uL (ref 0.0–0.2)
Basos: 1 %
EOS (ABSOLUTE): 0.3 10*3/uL (ref 0.0–0.4)
Eos: 7 %
Hematocrit: 36.8 % (ref 34.0–46.6)
Hemoglobin: 12.2 g/dL (ref 11.1–15.9)
Immature Grans (Abs): 0 10*3/uL (ref 0.0–0.1)
Immature Granulocytes: 0 %
Lymphocytes Absolute: 1.3 10*3/uL (ref 0.7–3.1)
Lymphs: 30 %
MCH: 27.9 pg (ref 26.6–33.0)
MCHC: 33.2 g/dL (ref 31.5–35.7)
MCV: 84 fL (ref 79–97)
Monocytes Absolute: 0.3 10*3/uL (ref 0.1–0.9)
Monocytes: 6 %
Neutrophils Absolute: 2.4 10*3/uL (ref 1.4–7.0)
Neutrophils: 56 %
Platelets: 238 10*3/uL (ref 150–450)
RBC: 4.37 x10E6/uL (ref 3.77–5.28)
RDW: 15.5 % — ABNORMAL HIGH (ref 12.3–15.4)
WBC: 4.3 10*3/uL (ref 3.4–10.8)

## 2018-05-16 LAB — LIPID PANEL
Chol/HDL Ratio: 2.4 ratio (ref 0.0–4.4)
Cholesterol, Total: 166 mg/dL (ref 100–199)
HDL: 68 mg/dL (ref >39)
LDL Calculated: 85 mg/dL (ref 0–99)
Triglycerides: 63 mg/dL (ref 0–149)
VLDL Cholesterol Cal: 13 mg/dL (ref 5–40)

## 2018-05-16 LAB — TSH: TSH: 2.64 u[IU]/mL (ref 0.450–4.500)

## 2018-05-16 LAB — HEMOGLOBIN A1C
Est. average glucose Bld gHb Est-mCnc: 105 mg/dL
Hgb A1c MFr Bld: 5.3 % (ref 4.8–5.6)

## 2018-06-11 ENCOUNTER — Telehealth: Payer: Self-pay | Admitting: Family Medicine

## 2018-06-11 NOTE — Telephone Encounter (Signed)
Pt called in and was given lab results ordered by Dr. Leretha PolSantiago on 05/15/18.  Your labs are normal. You do not have anemia or diabetes. You have normal electrolytes, kidney, liver and thyroid function.  No questions from pt.    There was not a result note created.

## 2018-06-19 NOTE — Progress Notes (Signed)
   Sierra Hensley  MRN: [id number] DOB: 05/09/1971  Subjective:  Sierra PattenKim T Hensley is a 47 y.o. female seen in office today for a chief complaint of lower rib pain x 3 weeks. Used an ab belt consistently throughout the day while sitting at work for a few days prior to the pain started. Stopped using the belt but the pain has persisted. Aggravating factors: certain movements. Alleviating factors: ibuprofen. Denies abdominal pain, nausea, vomiting, chest pain, palpitations, SOB, numbness, tingling, and lower leg swelling.   Review of Systems  Constitutional: Negative for chills, diaphoresis and fever.  Respiratory: Negative for cough.   Gastrointestinal: Negative for constipation.  Neurological: Negative for dizziness and light-headedness.    Patient Active Problem List   Diagnosis Date Noted  . History of gestational diabetes 05/15/2018  . Gastroesophageal reflux disease 05/15/2018    Current Outpatient Medications on File Prior to Visit  Medication Sig Dispense Refill  . cetirizine (ZYRTEC) 10 MG tablet Take 1 tablet (10 mg total) by mouth daily. 30 tablet 11  . fluticasone (FLONASE) 50 MCG/ACT nasal spray Place 1 spray into both nostrils 2 (two) times daily. 16 g 6  . ibuprofen (ADVIL,MOTRIN) 800 MG tablet Take 1 tablet (800 mg total) by mouth every 8 (eight) hours as needed. 40 tablet 1  . ranitidine (ZANTAC) 150 MG tablet Take 1 tablet (150 mg total) by mouth 2 (two) times daily. 60 tablet 5   No current facility-administered medications on file prior to visit.     No Known Allergies   Objective:  BP 108/70   Pulse 81   Temp 98.1 F (36.7 C) (Oral)   Resp 18   Ht 5' 4.57" (1.64 m)   Wt 132 lb 9.6 oz (60.1 kg)   LMP 05/30/2018   SpO2 100%   Breastfeeding? No   BMI 22.36 kg/m   Physical Exam  Constitutional: She is oriented to person, place, and time. She appears well-developed and well-nourished. She does not appear ill. No distress.  HENT:  Head: Normocephalic and  atraumatic.  Eyes: Conjunctivae are normal.  Neck: Normal range of motion.  Cardiovascular: Normal rate, regular rhythm and normal heart sounds.  Pulmonary/Chest: Effort normal and breath sounds normal. She has no decreased breath sounds. She has no wheezes. She has no rhonchi. She has no rales. She exhibits tenderness.    Abdominal: Soft. Normal appearance and bowel sounds are normal. There is no tenderness. There is no rigidity, no rebound, no guarding, no CVA tenderness, no tenderness at McBurney's point and negative Murphy's sign.  Neurological: She is alert and oriented to person, place, and time.  Skin: Skin is warm and dry.  Psychiatric: She has a normal mood and affect.  Vitals reviewed.   Assessment and Plan :  1. Rib pain 2. Muscle strain Hx and PE findings suspicious for rib musculature strain or costochondritis. Vitals stable. No abdominal pain noted on exam. Rec rest, ice/heat to affected area, and NSAID consistently for one week. Avoid use of ab belt. Advised to return to clinic if symptoms worsen, do not improve in 1-2 weeks, or as needed.  - meloxicam (MOBIC) 7.5 MG tablet; Take 1-2 tablets (7.5-15 mg total) by mouth daily.  Dispense: 30 tablet; Refill: 0    Benjiman CoreBrittany Camila Maita PA-C  [facility] at [facility]  [facility] 06/20/2018 10:31 AM

## 2018-06-20 ENCOUNTER — Other Ambulatory Visit: Payer: Self-pay

## 2018-06-20 ENCOUNTER — Encounter: Payer: Self-pay | Admitting: Physician Assistant

## 2018-06-20 ENCOUNTER — Ambulatory Visit: Payer: BLUE CROSS/BLUE SHIELD | Admitting: Physician Assistant

## 2018-06-20 VITALS — BP 108/70 | HR 81 | Temp 98.1°F | Resp 18 | Ht 64.57 in | Wt 132.6 lb

## 2018-06-20 DIAGNOSIS — R0781 Pleurodynia: Secondary | ICD-10-CM

## 2018-06-20 DIAGNOSIS — T148XXA Other injury of unspecified body region, initial encounter: Secondary | ICD-10-CM

## 2018-06-20 MED ORDER — MELOXICAM 7.5 MG PO TABS: 7.5000 mg | ORAL_TABLET | Freq: Every day | ORAL | 0 refills | Status: DC

## 2018-06-20 NOTE — Patient Instructions (Addendum)
This is most consistent with musculoskeletal strain. Please use ice or heating pad to affected area 4-5 x a day for 20 minutes a time. Avoid aggravating maneuvers. Use meloxicam daily for the next week. Take with food. If this upsets your acid reflux symptoms, please let us know. Return to clinic if symptoms worsen, do not improve in 1-2 weeks, or as needed.    IF you received an x-ray today, you will receive an invoice from [facility]. Please contact [facility] at [phone] with questions or concerns regarding your invoice.   IF you received labwork today, you will receive an invoice from [facility]. Please contact [facility] at [phone] with questions or concerns regarding your invoice.   Our billing staff will not be able to assist you with questions regarding bills from these companies.  You will be contacted with the lab results as soon as they are available. The fastest way to get your results is to activate your My Chart account. Instructions are located on the last page of this paperwork. If you have not heard from us regarding the results in 2 weeks, please contact this office.     Costochondritis Costochondritis is swelling and irritation (inflammation) of the tissue (cartilage) that connects your ribs to your breastbone (sternum). This causes pain in the front of your chest. Usually, the pain:  Starts gradually.  Is in more than one rib.  This condition usually goes away on its own over time. Follow these instructions at home:  Do not do anything that makes your pain worse.  If directed, put ice on the painful area: ? Put ice in a plastic bag. ? Place a towel between your skin and the bag. ? Leave the ice on for 20 minutes, 2-3 times a day.  If directed, put heat on the affected area as often as told by your doctor. Use the heat source that your doctor tells you to use, such as a moist heat pack or a heating pad. ? Place a towel between your skin and  the heat source. ? Leave the heat on for 20-30 minutes. ? Take off the heat if your skin turns bright red. This is very important if you cannot feel pain, heat, or cold. You may have a greater risk of getting burned.  Take over-the-counter and prescription medicines only as told by your doctor.  Return to your normal activities as told by your doctor. Ask your doctor what activities are safe for you.  Keep all follow-up visits as told by your doctor. This is important. Contact a doctor if:  You have chills or a fever.  Your pain does not go away or it gets worse.  You have a cough that does not go away. Get help right away if:  You are short of breath. This information is not intended to replace advice given to you by your health care provider. Make sure you discuss any questions you have with your health care provider. Document Released: 05/02/2008 Document Revised: 06/03/2016 Document Reviewed: 03/09/2016 Elsevier Interactive Patient Education  [employer]2018 Elsevier Inc.

## 2018-07-26 ENCOUNTER — Encounter: Payer: Self-pay | Admitting: Physician Assistant

## 2018-07-26 ENCOUNTER — Ambulatory Visit: Payer: BLUE CROSS/BLUE SHIELD | Admitting: Physician Assistant

## 2018-07-26 ENCOUNTER — Other Ambulatory Visit: Payer: Self-pay

## 2018-07-26 VITALS — BP 96/62 | HR 73 | Temp 98.5°F | Resp 16 | Ht 63.75 in | Wt 136.0 lb

## 2018-07-26 DIAGNOSIS — L500 Allergic urticaria: Secondary | ICD-10-CM

## 2018-07-26 MED ORDER — CETIRIZINE HCL 10 MG PO TABS: 10.0000 mg | ORAL_TABLET | Freq: Every day | ORAL | 1 refills | Status: DC

## 2018-07-26 MED ORDER — PREDNISONE 20 MG PO TABS: ORAL_TABLET | ORAL | 0 refills | Status: DC

## 2018-07-26 MED ORDER — RANITIDINE HCL 150 MG PO TABS: 150.0000 mg | ORAL_TABLET | Freq: Two times a day (BID) | ORAL | 1 refills | Status: DC

## 2018-07-26 NOTE — Progress Notes (Signed)
   Alfonso PattenKim T Cambria  MRN: [id number] DOB: 03/24/1971  PCP: Myles LippsSantiago, Irma M, MD  Subjective:  Pt is a 47 year old female who presents to clinic for itching  Itching all over x 2 weeks. Started after she ate fish.  denies throat swelling, shob, cough, chest tightness, rash.   Review of Systems  Respiratory: Negative for cough, shortness of breath and wheezing.   Cardiovascular: Negative for chest pain and palpitations.  Gastrointestinal: Negative for abdominal pain, nausea and vomiting.  Skin: Negative for rash.    Patient Active Problem List   Diagnosis Date Noted  . History of gestational diabetes 05/15/2018  . Gastroesophageal reflux disease 05/15/2018    Current Outpatient Medications on File Prior to Visit  Medication Sig Dispense Refill  . cetirizine (ZYRTEC) 10 MG tablet Take 1 tablet (10 mg total) by mouth daily. 30 tablet 11  . fluticasone (FLONASE) 50 MCG/ACT nasal spray Place 1 spray into both nostrils 2 (two) times daily. 16 g 6  . ibuprofen (ADVIL,MOTRIN) 800 MG tablet Take 1 tablet (800 mg total) by mouth every 8 (eight) hours as needed. 40 tablet 1  . ranitidine (ZANTAC) 150 MG tablet Take 1 tablet (150 mg total) by mouth 2 (two) times daily. 60 tablet 5  . meloxicam (MOBIC) 7.5 MG tablet Take 1-2 tablets (7.5-15 mg total) by mouth daily. (Patient not taking: Reported on 07/26/2018) 30 tablet 0   No current facility-administered medications on file prior to visit.     No Known Allergies   Objective:  BP 96/62 (BP Location: Left Arm, Patient Position: Sitting, Cuff Size: Normal)   Pulse 73   Temp 98.5 F (36.9 C) (Oral)   Resp 16   Ht 5' 3.75" (1.619 m)   Wt 136 lb (61.7 kg)   LMP 07/01/2018   SpO2 97%   BMI 23.53 kg/m   Physical Exam  Constitutional: She appears well-developed and well-nourished.  Skin: Skin is warm and dry. No rash noted.  Psychiatric: She has a normal mood and affect. Her behavior is normal. Judgment and thought content normal.  Vitals  reviewed.   Assessment and Plan :  1. Allergic urticaria - pt presents c/o itching after eating fish. No red flags. Plan to treat for allergic urticaria. Discussed with pt food allergies and consider allergy referral in the future.  - predniSONE (DELTASONE) 20 MG tablet; Take 3 PO QAM x2days, 2 PO QAM x2days, 1 PO QAM x2days  Dispense: 12 tablet; Refill: 0 - cetirizine (ZYRTEC) 10 MG tablet; Take 1 tablet (10 mg total) by mouth daily.  Dispense: 30 tablet; Refill: 1 - ranitidine (ZANTAC) 150 MG tablet; Take 1 tablet (150 mg total) by mouth 2 (two) times daily.  Dispense: 60 tablet; Refill: 1   Whitney Kileigh Ortmann, PA-C  [facility] at [facility] 07/26/2018 2:48 PM  Please note: Portions of this report may have been transcribed using dragon voice recognition software. Every effort was made to ensure accuracy; however, inadvertent computerized transcription errors may be present.

## 2018-07-26 NOTE — Patient Instructions (Addendum)
u?ng prednisone 3 Pills (60mg ) trong 2 ngy; sau ? 2 thu?c (40mg ) trong 2 ngy; sau ? 1 vin thu?c (20mg ) cho 2 ngy C?ng m?t Cetirizine 10 mg m?t l?n m?i ngy v ranitidine 150 mg hai l?n m?i ngy cho 2 tu?n.  ------------------------------------------------------- Start taking prednisone 60mg  x 2 days, 40mg  x 2 days then 20mg  x 2 days.  Cetirizine 10 mg once daily and Ranitidine 150 mg twice daily for 2 weeks.    Pht ban Hives Pha?t ban (ma?y ?ay) la? nh??ng ch? ng??a, ?o? va? s?ng trn da quy? vi?. Pha?t ban co? th? xu?t hi?n trn b?t ky? b? ph?n na?o trn c? th? va? co? th? co? ki?ch th???c kha?c nhau. Chu?ng co? th? chi? nho? b??ng ??u bu?t ho??c l??n h?n nhi?u. Pha?t ban th???ng h?t trong vo?ng 24 gi?? (pha?t ban c?p ti?nh). Trong nh??ng tr???ng h??p kha?c, ca?c v?t pha?t ban m??i xu?t hi?n sau khi ca?c v?t cu? m?t d?n. Chu ky? na?y co? th? ti?p tu?c trong va?i nga?y ho??c va?i tu?n (pha?t ban ma?n ti?nh). Pha?t ban la? k?t qua? cu?a pha?n ??ng cu?a c? th? quy? vi? v??i m?t ch?t ki?ch thi?ch ho??c v??i m?t th?? ma? quy? vi? bi? di? ??ng (ta?c nhn kh??i pha?t). Khi quy? vi? ti?p xu?c v??i m?t ta?c nhn kh?i pha?t, c? th? quy? vi? s? gia?i pho?ng m?t ho?a ch?t (histamine) gy ?o?, ng??a va? s?ng. Quy? vi? co? th? bi? pha?t ban ngay sau khi ti?p xu?c v??i ta?c nhn kh??i pha?t ho??c sau ?o? ha?ng gi??. Pht ban khng ly t? ng??i sang ng??i (khng ly lan). Cc vng pht ban cu?a quy? vi? c th? tr?m tr?ng h?n n?u gi, t?p th? d?c v b? c?ng th?ng tinh th?n. Nguyn nhn g gy ra? Nh??ng nguyn nhn gy ra tnh tr?ng ny bao g?m:  Di? ??ng v??i m?t s? loa?i th??c ?n ho??c cc tha?nh ph?n nh?t ??nh.  Cn trng c?n ho?c ??t.  Ti?p xu?c v??i ph?n hoa ho??c va?y da v?t nui.  Ti?p xc v?i cao su ho??c ha ch?t.  ?? lu d???i n??ng, no?ng ho??c la?nh (ph?i nhi?m).  T?p luy?n.  C?ng th?ng.  Quy? vi? cu?ng co? th? bi? pha?t ban do m?t s? tnh  tr?ng b?nh ly? va? cc bi?n php ?i?u tri?Marland Kitchen. Nh?ng b?nh l ny bao g?m:  Cc vi ru?t, bao g?m c? c?m l?nh thng th??ng.  Nhi?m vi khu?n, ch??ng ha?n nh? nhi?m tru?ng ????ng ti?u va? vim ho?ng do lin c?u khu?n.  Nh??ng b?nh l nh? vim m?ch, lupus ho?c b?nh tuy?n gip.  M?t s? loa?i thu?c nh?t ??nh.  Tim thu?c ch??a di? ??ng.  Truy?n mu.  ?i khi khng r nguyn nhn gy pha?t ban (pha?t ban t?? pha?t). ?i?u g lm t?ng nguy c?? Tnh tr?ng ny hay x?y ra h?n ?:  Ph? n?.  Nh??ng ng???i bi? di? ??ng th??c ph?m, ???c bi?t l di? ??ng v??i cam quy?t, s??a, tr??ng, la?c, ca?c loa?i ha?t ho??c tm cua so? h?n.  Nh??ng ng???i bi? di? ??ng v??i: ? Thu?c. ? Cao su. ? Cn tru?ng. ? ??ng v?t. ? Ph?n hoa.  Nh??ng ng???i bi? m?t s? b?nh ly? nh?t ?i?nh, bao g?mlupus ho??c b?nh tuy?n gia?p.  Cc d?u hi?u ho?c tri?u ch?ng l g? Tri?u ch??ng chi?nh cu?a b?nh na?y la? ca?c u cu?c ho??c ma?ng ?o? ho??c tr??ng n?i ln, ng??atrn da cu?a quy? vi?. Nh??ng vng na?y co? th?:  To ra va? s?ng ln (v?t s?ng).  Thay ??i kch th??c va?  vi? tri? nhanh chng v l?p ?i l?p l?i.  La? pha?t ban ring r? ho??c k?t v??i nhau trn m?t vu?ng da l??n.  ?au nho?i ho??c tr?? nn ?au ???n.  Chuy?n tha?nh ma?u tr??ng khi ?n va?o gi??a (tr?ng nh??t).  Trong nh??ng tr???ng h??p n?ng, tay, chn va? m??tquy? vi? cu?ng co? th? bi? s?ng. ?i?u ny c th? x?y ra n?u pht ban pht tri?n su h?n trong da. Ch?n ?on tnh tr?ng ny nh? th? no? Tnh tr?ng ny c th? ???c ch?n ?on d?a vo tri?u ch??ng, khai thc b?nh s? v khm th?c th?. Da, n???c ti?u ho??c ma?u co? th? ????c xe?t nghi?m ?? ti?m nguyn nhn pha?t ban va? ?? loa?i tr?? ca?c v?n ?? s??c kho?e kha?c. Chuyn gia ch?m so?c s??c kho?e cu?ng co? th? l?y m?t m?u da nho? t?? vu?ng bi? a?nh h???ng ?? ki?m tra d???i ki?nh hi?n vi (sinh thi?t). Tnh tr?ng ny ???c ?i?u tr? nh? th? no? Vi?c ?i?u tr? ty thu?c  va?o m?c ?? n?ng c?a b?nh. Chuyn gia ch?m so?c s??c kho?e co? th? khuy?n nghi? s?? du?ng mi?ng va?i la?nh, ???t (b?ng e?p la?nh) ho??c t??m n???c ma?t ?? gia?m ng??a. ?i khi pha?t ban ????c ?i?u tri? b??ng thu?c, bao g?m:  Thu?c khng histamine.  Corticosteroid.  Khng sinh.  M?t loa?i thu?c tim (omalizumab). Chuyn gia ch?m so?c s??c kho?e cu?a quy? vi? co? th? k ??n thu?c na?y n?u quy? vi? bi? pha?t ban t?? pha?t ma?n ti?nh va? quy? vi? ti?p tu?c co? ca?c tri?u ch??ng ngay ca? sau khi ?i?u tri? b??ng thu?c kha?ng histamine.  Nh??ng tr???ng h??p n?ng co? th? c?n tim kh?n c?p adrenaline (epinephrine) ?? tra?nh pha?n ??ng di? ??ng ?e do?a ma?ng s?ng (pha?n v?). Tun th? nh?ng h??ng d?n ny ? nh: Thu?c  Ch? dng ho?c bi thu?c khng k ??n v thu?c k ??n theo ch? d?n c?a chuyn gia ch?m [place] s?c kh?e.  N?u qu v? ???c k thu?c khng sinh, hy dng thu?c theo ch? d?n c?a chuyn gia ch?m [place] s?c kh?e. Khng d?ng u?ng thu?c khng sinh ngay c? khi qu v? b?t ??u c?m th?y ?? h?n. Ch?m so?c da  Ch??m mt vo vng b? ?nh h??ng.  Khng gi ho?c ch xt vo da qu v?. H??ng d?n chung  Khng t??m vo?i hoa sen ho??c t??m b?n b??ng n???c no?ng. Vi?c na?y co? th? la?m ng??a nhi?u thm.  Khng m??c qu?n a?o ch?t.  S?? du?ng kem ch?ng n??ng va? qu?n a?o ba?o h? khi quy? vi? ra ngoa?i tr??i.  Trnh b?t k? ch?t no ma? c th? gy pht ban. Ghi nh?t k ?? giu?p quy? vi? theo di nh?ng g lm qu v? pht ban. Ghi l?i: ? Quy? vi? du?ng thu?c gi?Marland Kitchen Ladell Heads v? ?n v u?ng g. ? Quy? vi? s?? du?ng sa?n ph?m na?o trn da.  Tun th? t?t c? cc l?n khm theo di theo ch? d?n c?a chuyn gia ch?m [place] s?c kh?e. ?i?u ny c vai tr quan tr?ng. Hy lin l?c v?i chuyn gia ch?m [place] s?c kh?e n?u:  Tri?u ch?ng c?a qu v? khng ki?m sot ???c b?ng thu?c.  Cc kh?p c?a qu v? b? ?au ho?c s?ng. Yu c?u tr? gip ngay l?p t?c n?u:  Qu v? b? s?t.  Qu v? b? ?au ? b?ng.  L??i  ho?c mi c?a qu v? b? s?ng.  Mi? m??t cu?a quy? vi? bi? s?ng.  Quy? vi? c?m th?y th?t ngh?t ? ng?c ho?c ? h?ng.  Qu v? b? kh th? ho?c kh nu?t. Nh?ng tri?u ch?ng ny c th? l bi?u hi?n c?a m?t v?n ?? nghim tr?ng c?n c?p c?u. Khng ch? xem tri?u ch?ng c h?t khng. Hy ?i khm ngay l?p t?c. G?i cho d?ch v? c?p c?u t?i ??a ph??ng (911 ? [place] Khng t? li xe ??n b?nh vi?n. Thng tin ny khng nh?m m?c ?ch thay th? cho l?i khuyn m chuyn gia ch?m [place] s?c kh?e ni v?i qu v?. Hy b?o ??m qu v? ph?i th?o lu?n b?t k? v?n ?? g m qu v? c v?i chuyn gia ch?m [place] s?c kh?e c?a qu v?. Document Released: 11/14/2005 Document Revised: 03/01/2017 Document Reviewed: 09/02/2015 Elsevier Interactive Patient Education  2018 [employer].   IF you received an x-ray today, you will receive an invoice from [facility]. Please contact [facility] at [phone] with questions or concerns regarding your invoice.   IF you received labwork today, you will receive an invoice from [place]. Please contact [facility] at [phone] with questions or concerns regarding your invoice.   Our billing staff will not be able to assist you with questions regarding bills from these companies.  You will be contacted with the lab results as soon as they are available. The fastest way to get your results is to activate your My Chart account. Instructions are located on the last page of this paperwork. If you have not heard from [place] regarding the results in 2 weeks, please contact this office.

## 2018-08-02 ENCOUNTER — Ambulatory Visit: Payer: BLUE CROSS/BLUE SHIELD | Admitting: Family Medicine

## 2018-08-03 ENCOUNTER — Other Ambulatory Visit: Payer: Self-pay

## 2018-08-03 ENCOUNTER — Ambulatory Visit: Payer: BLUE CROSS/BLUE SHIELD | Admitting: Emergency Medicine

## 2018-08-03 ENCOUNTER — Encounter: Payer: Self-pay | Admitting: Emergency Medicine

## 2018-08-03 VITALS — BP 100/60 | HR 100 | Temp 98.9°F | Resp 16 | Ht 63.75 in | Wt 133.4 lb

## 2018-08-03 DIAGNOSIS — R197 Diarrhea, unspecified: Secondary | ICD-10-CM | POA: Insufficient documentation

## 2018-08-03 DIAGNOSIS — R1084 Generalized abdominal pain: Secondary | ICD-10-CM | POA: Insufficient documentation

## 2018-08-03 LAB — POCT CBC
GRANULOCYTE PERCENT: 82.8 % — AB (ref 37–80)
HCT, POC: 38.4 % (ref 37.7–47.9)
Hemoglobin: 13.1 g/dL (ref 12.2–16.2)
Lymph, poc: 0.7 (ref 0.6–3.4)
MCH, POC: 28.7 pg (ref 27–31.2)
MCHC: 34 g/dL (ref 31.8–35.4)
MCV: 84.3 fL (ref 80–97)
MID (CBC): 0.4 (ref 0–0.9)
MPV: 6.8 fL (ref 0–99.8)
POC Granulocyte: 5.3 (ref 2–6.9)
POC LYMPH %: 11.3 % (ref 10–50)
POC MID %: 5.9 %M (ref 0–12)
Platelet Count, POC: 221 10*3/uL (ref 142–424)
RBC: 4.56 M/uL (ref 4.04–5.48)
RDW, POC: 14 %
WBC: 6.4 10*3/uL (ref 4.6–10.2)

## 2018-08-03 MED ORDER — DIPHENOXYLATE-ATROPINE 2.5-0.025 MG PO TABS: 2.0000 | ORAL_TABLET | Freq: Three times a day (TID) | ORAL | 0 refills | Status: AC | PRN

## 2018-08-03 MED ORDER — CIPROFLOXACIN HCL 500 MG PO TABS: 500.0000 mg | ORAL_TABLET | Freq: Two times a day (BID) | ORAL | 0 refills | Status: DC

## 2018-08-03 MED ORDER — KETOROLAC TROMETHAMINE 30 MG/ML IJ SOLN
30.0000 mg | Freq: Once | INTRAMUSCULAR | Status: AC
  Administered 2018-08-03: 30 mg via INTRAMUSCULAR

## 2018-08-03 NOTE — Progress Notes (Addendum)
Alfonso Patten 47 y.o.   Chief Complaint  Patient presents with  . Abdominal Pain    x 2 days was seen in ED at [facility]    HISTORY OF PRESENT ILLNESS: This is a 47 y.o. female complaining of diffuse abdominal cramping with watery profuse diarrhea that started yesterday morning after eating Vietnamese dish with raw eggs.  Went to the emergency room in [place] and was evaluated.  Unremarkable blood work.  Treated and released.  Given prescription for Zofran was advised to take acetaminophen as needed for pain.  Pregnancy test was negative and urinalysis was unremarkable.  CBC and CMP unremarkable.  [place] interpreter named Daryl Eastern used for this interview.  [facility] visit record reviewed.  HPI   Prior to Admission medications   Medication Sig Start Date End Date Taking? Authorizing Provider  cetirizine (ZYRTEC) 10 MG tablet Take 1 tablet (10 mg total) by mouth daily. 07/26/18   McVey, Madelaine Bhat, PA-C  fluticasone (FLONASE) 50 MCG/ACT nasal spray Place 1 spray into both nostrils 2 (two) times daily. 05/15/18   Myles Lipps, MD  ibuprofen (ADVIL,MOTRIN) 800 MG tablet Take 1 tablet (800 mg total) by mouth every 8 (eight) hours as needed. 12/12/14   [name], Augusto Gamble, MD  meloxicam (MOBIC) 7.5 MG tablet Take 1-2 tablets (7.5-15 mg total) by mouth daily. Patient not taking: Reported on 07/26/2018 06/20/18   Benjiman Core D, PA-C  predniSONE (DELTASONE) 20 MG tablet Take 3 PO QAM x2days, 2 PO QAM x2days, 1 PO QAM x2days 07/26/18   McVey, Madelaine Bhat, PA-C  ranitidine (ZANTAC) 150 MG tablet Take 1 tablet (150 mg total) by mouth 2 (two) times daily. 07/26/18   McVey, Madelaine Bhat, PA-C    No Known Allergies  Patient Active Problem List   Diagnosis Date Noted  . History of gestational diabetes 05/15/2018  . Gastroesophageal reflux disease 05/15/2018    Past Medical History:  Diagnosis Date  . 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     Past Surgical History:  Procedure Laterality Date  . CESAREAN SECTION MULTI-GESTATIONAL N/A 12/09/2014   Procedure: CESAREAN SECTION MULTI-GESTATIONAL;  Surgeon: Lavina Hamman, MD;  Location: [facility];  Service: Obstetrics;  Laterality: N/A;    Social History   Socioeconomic History  . Marital status: Married    Spouse name: Not on file  . Number of children: 3  . Years of education: Not on file  . Highest education level: Not on file  Occupational History  . Not on file  Social Needs  . Financial resource strain: Not on file  . Food insecurity:    Worry: Not on file    Inability: Not on file  . Transportation needs:    Medical: Not on file    Non-medical: Not on file  Tobacco Use  . Smoking status: Never Smoker  . Smokeless tobacco: Never Used  Substance and Sexual Activity  . Alcohol use: No  . Drug use: No  . Sexual activity: Yes  Lifestyle  . Physical activity:    Days per week: Not on file    Minutes per session: Not on file  . Stress: Not on file  Relationships  . Social connections:    Talks on phone: Not on file    Gets together: Not on file    Attends religious service: Not on file    Active member of club or organization: Not on file    Attends meetings of clubs or organizations: Not on  file    Relationship status: Not on file  . Intimate partner violence:    Fear of current or ex partner: Not on file    Emotionally abused: Not on file    Physically abused: Not on file    Forced sexual activity: Not on file  Other Topics Concern  . Not on file  Social History Narrative  . Not on file    No family history on file.   Review of Systems  Constitutional: Negative.  Negative for chills and fever.  HENT: Negative.  Negative for sore throat.   Eyes: Negative.   Respiratory: Negative.  Negative for cough and shortness of breath.   Cardiovascular: Negative.  Negative for chest pain and palpitations.  Gastrointestinal: Positive for abdominal pain,  diarrhea and nausea. Negative for blood in stool and vomiting.  Genitourinary: Negative for dysuria and hematuria.  Skin: Negative.  Negative for rash.  Neurological: Negative.  Negative for dizziness and headaches.  Endo/Heme/Allergies: Negative.   All other systems reviewed and are negative.  Vitals:   08/03/18 1405  BP: 100/60  Pulse: 100  Resp: 16  Temp: 98.9 F (37.2 C)  SpO2: 97%     Physical Exam  Constitutional: She is oriented to person, place, and time. She appears well-developed and well-nourished.  HENT:  Head: Normocephalic.  Nose: Nose normal.  Mouth/Throat: Oropharynx is clear and moist.  Eyes: Pupils are equal, round, and reactive to light. Conjunctivae and EOM are normal.  Neck: Normal range of motion. Neck supple.  Cardiovascular: Normal rate, regular rhythm and normal heart sounds.  Pulmonary/Chest: Effort normal and breath sounds normal.  Abdominal: Soft. She exhibits no distension and no mass. Bowel sounds are increased. There is no hepatosplenomegaly. There is generalized tenderness (diffuse). There is no rigidity, no rebound, no guarding, no tenderness at McBurney's point and negative Murphy's sign.  Musculoskeletal: Normal range of motion.  Neurological: She is alert and oriented to person, place, and time. No sensory deficit. She exhibits normal muscle tone.  Skin: Skin is warm and dry. Capillary refill takes less than 2 seconds.  Psychiatric: She has a normal mood and affect. Her behavior is normal.  Vitals reviewed.  Results for orders placed or performed in visit on 08/03/18 (from the past 24 hour(s))  POCT CBC     Status: Abnormal   Collection Time: 08/03/18  2:18 PM  Result Value Ref Range   WBC 6.4 4.6 - 10.2 K/uL   Lymph, poc 0.7 0.6 - 3.4   POC LYMPH PERCENT 11.3 10 - 50 %L   MID (cbc) 0.4 0 - 0.9   POC MID % 5.9 0 - 12 %M   POC Granulocyte 5.3 2 - 6.9   Granulocyte percent 82.8 (A) 37 - 80 %G   RBC 4.56 4.04 - 5.48 M/uL   Hemoglobin  13.1 12.2 - 16.2 g/dL   HCT, POC 81.1 91.4 - 47.9 %   MCV 84.3 80 - 97 fL   MCH, POC 28.7 27 - 31.2 pg   MCHC 34.0 31.8 - 35.4 g/dL   RDW, POC 78.2 %   Platelet Count, POC 221 142 - 424 K/uL   MPV 6.8 0 - 99.8 fL     ASSESSMENT & PLAN: Rishika was seen today for abdominal pain.  Diagnoses and all orders for this visit:  Generalized abdominal cramping -     ketorolac (TORADOL) 30 MG/ML injection 30 mg  Diarrhea of presumed infectious origin -  POCT CBC -     ciprofloxacin (CIPRO) 500 MG tablet; Take 1 tablet (500 mg total) by mouth 2 (two) times daily. -     diphenoxylate-atropine (LOMOTIL) 2.5-0.025 MG tablet; Take 2 tablets by mouth 3 (three) times daily as needed for up to 3 days for diarrhea or loose stools.    Patient Instructions       If you have lab work done today you will be contacted with your lab results within the next 2 weeks.  If you have not heard from [place] then please contact us. The fastest way to get your results is to register for My Chart.   IF you received an x-ray today, you will receive an invoice from [facility]. Please contact [facility] at [phone] with questions or concerns regarding your invoice.   IF you received labwork today, you will receive an invoice from [facility]. Please contact [facility] at [phone] with questions or concerns regarding your invoice.   Our billing staff will not be able to assist you with questions regarding bills from these companies.  You will be contacted with the lab results as soon as they are available. The fastest way to get your results is to activate your My Chart account. Instructions are located on the last page of this paperwork. If you have not heard from [place] regarding the results in 2 weeks, please contact this office.     Tiu cha?y, Ng???i l??n Diarrhea, Adult Tiu cha?y la? th??ng xuyn ?a?i ti?n phn lo?ng v ton n???c. Tiu cha?y co? th? la?m quy? vi? y?u va? la?m quy? vi?  bi? m?t n???c. M?t n???c co? th? la?m quy? vi? m?t mo?i va? kha?t n???c, la?m quy? vi? kh mi?ng va? gia?m t?n su?t ?i ti?u. Tiu cha?y th???ng ke?o da?i 2-3 nga?y. Tuy nhin, b?nh co? th? ke?o da?i h?n n?u ?o? la? m?t d?u hi?u cu?a m?t b?nh na?o ?o? nghim tro?ng h?n. ?i?u quan tro?ng la? pha?i ?i?u tri? b?nh tiu cha?y theo chi? d?n cu?a chuyn gia ch?m so?c s??c kho?e. Tun th? nh?ng h??ng d?n ny ? nh: ?n v u?ng  Tun theo nh?ng khuy?n nghi? ny theo ch? d?n c?a chuyn gia ch?m [place] s?c kh?e:  U?ng m?t dung di?ch bu? n???c (ORS). ?y la? m?t lo?i n??c u?ng c ba?n ta?i nh thu?c va? ca?c c??a ha?ng ba?n le?Marland Kitchen  U?ng cc lo?i d?ch lo?ng trong, ch??ng ha?n nh? n???c, ?a? ba?o, n???c e?p tra?i cy loa?ng va? ca?c lo?i n??c u?ng th? thao i?t calo.  ?n nh??ng l???ng nho? th??c ?n nha?t, d? tiu khi quy? vi? co? th?. Nh??ng th??c ?n na?y bao g?m chu?i, n???c ta?o, c?m, thi?t na?c, ba?nh mi? n???ng va? ba?nh quy.  Tra?nh u?ng ca?c ch?t lo?ng co? ch??a nhi?u ????ng ho??c caffeine, ch??ng ha?n nh? th?c u?ng n?ng l???ng, th?c n??c u?ng th? thao va? n???c s da.  Trnh u?ng r??u.  Trnh th?c ?n nhi?u gia v? va? nhi?u ch?t be?o.  H??ng d?n chung  U?ng ?? n??c ?? gi? cho n??c ti?u trong ho?c c mu vng nh?t.  R?a tay th??ng xuyn. N?u khng c x phng v n??c, hy dng thu?c st trng tay.  Ha?y ba?o ?a?m cho t?t ca? mo?i ng???i trong nha? quy? vi? r??a tay ky? va? th???ng xuyn.  Ch? s? d?ng thu?c khng k ??n v thu?c k ??n theo ch? d?n c?a chuyn gia ch?m [place] s?c kh?e.  Nghi? ng?i ?? nha? khi quy? vi? bi?nh phu?c.  Theo di tnh tr?ng  c?a qu v? ?? pht hi?n b?t k? thay ??i no.  T??m b?n t??m n???c no?ng ?? gia?m b?t k? ca?m gia?c no?ng ra?t ho??c ?au no do tiu cha?y th???ng xuyn.  Tun th? t?t c? cc l?n khm theo di theo ch? d?n c?a chuyn gia ch?m [place] s?c kh?e. ?i?u ny c vai tr quan tr?ng. Hy lin l?c v?i chuyn gia ch?m [place] s?c  kh?e n?u:  Qu v? b? s?t.  B?nh tiu cha?y cu?a quy? vi? n??ng thm.  Qu v? c cc tri?u ch?ng m?i.  Quy? vi? khng th? gi?? ???c ch?t lo?ng trong ng???i.  Qu v? c?m th?y chong vng ho?c cho?ng m??t.  Qu v? b? ?au ??u  Qu v? b? co th?t c?. Yu c?u tr? gip ngay l?p t?c n?u:  Qu v? b? ?au ng?c.  Qu v? c?m th?y r?t y?u ho?c qu v? b? ng?t.  Quy? vi? ?i ngoi phn c ma?u ho??c phn ma?u ?en ho??c phn trng nh? h??c i?n.  Qu v? b? ?au r?t nhi?u, co th?t ho??c ch???ng b?ng.  Quy? vi? kho? th?? ho??c quy? vi? th?? r?t nhanh.  Tim quy? vi? ??p r?t nhanh.  Da Anselmo Rod? vi? ca?m th?y la?nh va? ?m ???t.  Qu v? c?m th?y l l?n.  Qu v? c cc d?u hi?u m?t n??c, ch?ng h?n nh?: ? N??c ti?u s?m mu, r?t t n??c ti?u, ho?c khng c n??c ti?u. ? Mi n?t n?. ? Mi?ng kh. ? M?t tr?ng. ? Bu?n ng?. ? Y?u. Thng tin ny khng nh?m m?c ?ch thay th? cho l?i khuyn m chuyn gia ch?m [place] s?c kh?e ni v?i qu v?. Hy b?o ??m qu v? ph?i th?o lu?n b?t k? v?n ?? g m qu v? c v?i chuyn gia ch?m [place] s?c kh?e c?a qu v?. Document Released: 03/01/2011 Document Revised: 02/27/2017 Document Reviewed: 07/21/2015 Elsevier Interactive Patient Education  2018 Elsevier Inc.      Edwina Barth, MD [facility]

## 2018-08-03 NOTE — Patient Instructions (Addendum)
If you have lab work done today you will be contacted with your lab results within the next 2 weeks.  If you have not heard from [place] then please contact us. The fastest way to get your results is to register for My Chart.   IF you received an x-ray today, you will receive an invoice from [facility]. Please contact [facility] at [phone] with questions or concerns regarding your invoice.   IF you received labwork today, you will receive an invoice from [place]. Please contact [facility] at [phone] with questions or concerns regarding your invoice.   Our billing staff will not be able to assist you with questions regarding bills from these companies.  You will be contacted with the lab results as soon as they are available. The fastest way to get your results is to activate your My Chart account. Instructions are located on the last page of this paperwork. If you have not heard from [place] regarding the results in 2 weeks, please contact this office.     Tiu cha?y, Ng???i l??n Diarrhea, Adult Tiu cha?y la? th??ng xuyn ?a?i ti?n phn lo?ng v ton n???c. Tiu cha?y co? th? la?m quy? vi? y?u va? la?m quy? vi? bi? m?t n???c. M?t n???c co? th? la?m quy? vi? m?t mo?i va? kha?t n???c, la?m quy? vi? kh mi?ng va? gia?m t?n su?t ?i ti?u. Tiu cha?y th???ng ke?o da?i 2-3 nga?y. Tuy nhin, b?nh co? th? ke?o da?i h?n n?u ?o? la? m?t d?u hi?u cu?a m?t b?nh na?o ?o? nghim tro?ng h?n. ?i?u quan tro?ng la? pha?i ?i?u tri? b?nh tiu cha?y theo chi? d?n cu?a chuyn gia ch?m so?c s??c kho?e. Tun th? nh?ng h??ng d?n ny ? nh: ?n v u?ng  Tun theo nh?ng khuy?n nghi? ny theo ch? d?n c?a chuyn gia ch?m [place] s?c kh?e:  U?ng m?t dung di?ch bu? n???c (ORS). ?y la? m?t lo?i n??c u?ng c ba?n ta?i nh thu?c va? ca?c c??a ha?ng ba?n le?Marland Kitchen  U?ng cc lo?i d?ch lo?ng trong, ch??ng ha?n nh? n???c, ?a? ba?o, n???c e?p tra?i cy loa?ng va? ca?c lo?i n??c u?ng  th? thao i?t calo.  ?n nh??ng l???ng nho? th??c ?n nha?t, d? tiu khi quy? vi? co? th?. Nh??ng th??c ?n na?y bao g?m chu?i, n???c ta?o, c?m, thi?t na?c, ba?nh mi? n???ng va? ba?nh quy.  Tra?nh u?ng ca?c ch?t lo?ng co? ch??a nhi?u ????ng ho??c caffeine, ch??ng ha?n nh? th?c u?ng n?ng l???ng, th?c n??c u?ng th? thao va? n???c s da.  Trnh u?ng r??u.  Trnh th?c ?n nhi?u gia v? va? nhi?u ch?t be?o.  H??ng d?n chung  U?ng ?? n??c ?? gi? cho n??c ti?u trong ho?c c mu vng nh?t.  R?a tay th??ng xuyn. N?u khng c x phng v n??c, hy dng thu?c st trng tay.  Ha?y ba?o ?a?m cho t?t ca? mo?i ng???i trong nha? quy? vi? r??a tay ky? va? th???ng xuyn.  Ch? s? d?ng thu?c khng k ??n v thu?c k ??n theo ch? d?n c?a chuyn gia ch?m [place] s?c kh?e.  Nghi? ng?i ?? nha? khi quy? vi? bi?nh phu?c.  Theo di tnh tr?ng c?a qu v? ?? pht hi?n b?t k? thay ??i no.  T??m b?n t??m n???c no?ng ?? gia?m b?t k? ca?m gia?c no?ng ra?t ho??c ?au no do tiu cha?y th???ng xuyn.  Tun th? t?t c? cc l?n khm theo di theo ch? d?n c?a chuyn gia ch?m [place] s?c kh?e. ?i?u ny c vai tr quan tr?ng. Hy lin l?c  v?i chuyn gia ch?m [place] s?c kh?e n?u:  Qu v? b? s?t.  B?nh tiu cha?y cu?a quy? vi? n??ng thm.  Qu v? c cc tri?u ch?ng m?i.  Quy? vi? khng th? gi?? ???c ch?t lo?ng trong ng???i.  Qu v? c?m th?y chong vng ho?c cho?ng m??t.  Qu v? b? ?au ??u  Qu v? b? co th?t c?. Yu c?u tr? gip ngay l?p t?c n?u:  Qu v? b? ?au ng?c.  Qu v? c?m th?y r?t y?u ho?c qu v? b? ng?t.  Quy? vi? ?i ngoi phn c ma?u ho??c phn ma?u ?en ho??c phn trng nh? h??c i?n.  Qu v? b? ?au r?t nhi?u, co th?t ho??c ch???ng b?ng.  Quy? vi? kho? th?? ho??c quy? vi? th?? r?t nhanh.  Tim quy? vi? ??p r?t nhanh.  Da Anselmo Rod? vi? ca?m th?y la?nh va? ?m ???t.  Qu v? c?m th?y l l?n.  Qu v? c cc d?u hi?u m?t n??c, ch?ng h?n nh?: ? N??c ti?u s?m mu, r?t t n??c ti?u, ho?c khng c  n??c ti?u. ? Mi n?t n?. ? Mi?ng kh. ? M?t tr?ng. ? Bu?n ng?. ? Y?u. Thng tin ny khng nh?m m?c ?ch thay th? cho l?i khuyn m chuyn gia ch?m [place] s?c kh?e ni v?i qu v?. Hy b?o ??m qu v? ph?i th?o lu?n b?t k? v?n ?? g m qu v? c v?i chuyn gia ch?m [place] s?c kh?e c?a qu v?. Document Released: 03/01/2011 Document Revised: 02/27/2017 Document Reviewed: 07/21/2015 Elsevier Interactive Patient Education  2018 [employer].

## 2018-08-09 ENCOUNTER — Emergency Department (HOSPITAL_COMMUNITY): Payer: BLUE CROSS/BLUE SHIELD

## 2018-08-09 ENCOUNTER — Encounter (HOSPITAL_COMMUNITY): Payer: Self-pay

## 2018-08-09 ENCOUNTER — Inpatient Hospital Stay (HOSPITAL_COMMUNITY)
Admission: EM | Admit: 2018-08-09 | Discharge: 2018-08-21 | DRG: 373 | Disposition: A | Discharge status: Home / Self Care | Payer: BLUE CROSS/BLUE SHIELD | Attending: General Surgery | Admitting: General Surgery

## 2018-08-09 ENCOUNTER — Other Ambulatory Visit: Payer: Self-pay

## 2018-08-09 DIAGNOSIS — Z1624 Resistance to multiple antibiotics: Secondary | ICD-10-CM | POA: Diagnosis present

## 2018-08-09 DIAGNOSIS — B9620 Unspecified Escherichia coli [E. coli] as the cause of diseases classified elsewhere: Secondary | ICD-10-CM | POA: Diagnosis present

## 2018-08-09 DIAGNOSIS — K3533 Acute appendicitis with perforation and localized peritonitis, with abscess: Secondary | ICD-10-CM | POA: Diagnosis present

## 2018-08-09 DIAGNOSIS — K651 Peritoneal abscess: Secondary | ICD-10-CM

## 2018-08-09 HISTORY — DX: Acute appendicitis with perforation, localized peritonitis, and gangrene, with abscess: K35.33

## 2018-08-09 LAB — I-STAT BETA HCG BLOOD, ED (MC, WL, AP ONLY)

## 2018-08-09 LAB — COMPREHENSIVE METABOLIC PANEL
ALBUMIN: 2.5 g/dL — AB (ref 3.5–5.0)
ALT: 62 U/L — ABNORMAL HIGH (ref 0–44)
ANION GAP: 11 (ref 5–15)
AST: 32 U/L (ref 15–41)
Alkaline Phosphatase: 140 U/L — ABNORMAL HIGH (ref 38–126)
BILIRUBIN TOTAL: 0.4 mg/dL (ref 0.3–1.2)
BUN: 12 mg/dL (ref 6–20)
CO2: 24 mmol/L (ref 22–32)
Calcium: 8.6 mg/dL — ABNORMAL LOW (ref 8.9–10.3)
Chloride: 101 mmol/L (ref 98–111)
Creatinine, Ser: 0.68 mg/dL (ref 0.44–1.00)
GFR calc Af Amer: >60 mL/min (ref >60)
GFR calc non Af Amer: >60 mL/min (ref >60)
Glucose, Bld: 107 mg/dL — ABNORMAL HIGH (ref 70–99)
POTASSIUM: 3.6 mmol/L (ref 3.5–5.1)
Sodium: 136 mmol/L (ref 135–145)
Total Protein: 6.3 g/dL — ABNORMAL LOW (ref 6.5–8.1)

## 2018-08-09 LAB — CBC
HEMATOCRIT: 32.3 % — AB (ref 36.0–46.0)
HEMOGLOBIN: 10.5 g/dL — AB (ref 12.0–15.0)
MCH: 28.2 pg (ref 26.0–34.0)
MCHC: 32.5 g/dL (ref 30.0–36.0)
MCV: 86.8 fL (ref 78.0–100.0)
Platelets: 375 10*3/uL (ref 150–400)
RBC: 3.72 MIL/uL — ABNORMAL LOW (ref 3.87–5.11)
RDW: 13.7 % (ref 11.5–15.5)
WBC: 15.5 10*3/uL — ABNORMAL HIGH (ref 4.0–10.5)

## 2018-08-09 LAB — LIPASE, BLOOD: Lipase: 55 U/L — ABNORMAL HIGH (ref 11–51)

## 2018-08-09 LAB — URINALYSIS, ROUTINE W REFLEX MICROSCOPIC
Bilirubin Urine: NEGATIVE
Glucose, UA: NEGATIVE mg/dL
KETONES UR: NEGATIVE mg/dL
Leukocytes, UA: NEGATIVE
NITRITE: NEGATIVE
PROTEIN: 100 mg/dL — AB
Specific Gravity, Urine: 1.029 (ref 1.005–1.030)
pH: 5 (ref 5.0–8.0)

## 2018-08-09 MED ORDER — DIPHENHYDRAMINE HCL 50 MG/ML IJ SOLN
25.0000 mg | Freq: Four times a day (QID) | INTRAMUSCULAR | Status: DC | PRN
  Administered 2018-08-15 – 2018-08-20 (×4): 25 mg via INTRAVENOUS
  Filled 2018-08-09 (×4): qty 1

## 2018-08-09 MED ORDER — MORPHINE SULFATE (PF) 2 MG/ML IV SOLN
2.0000 mg | INTRAVENOUS | Status: DC | PRN
  Administered 2018-08-10 (×3): 2 mg via INTRAVENOUS
  Filled 2018-08-09 (×5): qty 1

## 2018-08-09 MED ORDER — MORPHINE SULFATE (PF) 4 MG/ML IV SOLN
4.0000 mg | Freq: Once | INTRAVENOUS | Status: AC
  Administered 2018-08-09: 4 mg via INTRAVENOUS
  Filled 2018-08-09: qty 1

## 2018-08-09 MED ORDER — ONDANSETRON 4 MG PO TBDP
4.0000 mg | ORAL_TABLET | Freq: Four times a day (QID) | ORAL | Status: DC | PRN
  Administered 2018-08-12: 4 mg via ORAL
  Filled 2018-08-09: qty 1

## 2018-08-09 MED ORDER — ACETAMINOPHEN 325 MG PO TABS
650.0000 mg | ORAL_TABLET | Freq: Four times a day (QID) | ORAL | Status: DC | PRN
  Administered 2018-08-11 – 2018-08-19 (×3): 650 mg via ORAL
  Filled 2018-08-09 (×3): qty 2

## 2018-08-09 MED ORDER — METRONIDAZOLE IN NACL 5-0.79 MG/ML-% IV SOLN
500.0000 mg | Freq: Once | INTRAVENOUS | Status: AC
  Administered 2018-08-09: 500 mg via INTRAVENOUS
  Filled 2018-08-09: qty 100

## 2018-08-09 MED ORDER — KCL IN DEXTROSE-NACL 20-5-0.45 MEQ/L-%-% IV SOLN
INTRAVENOUS | Status: DC
  Administered 2018-08-10 – 2018-08-11 (×3): via INTRAVENOUS
  Administered 2018-08-12: 75 mL/h via INTRAVENOUS
  Administered 2018-08-13 – 2018-08-15 (×2): via INTRAVENOUS
  Filled 2018-08-09 (×8): qty 1000

## 2018-08-09 MED ORDER — METOPROLOL TARTRATE 5 MG/5ML IV SOLN: 5.0000 mg | Freq: Four times a day (QID) | INTRAVENOUS | Status: DC | PRN

## 2018-08-09 MED ORDER — ONDANSETRON HCL 4 MG/2ML IJ SOLN
4.0000 mg | Freq: Once | INTRAMUSCULAR | Status: AC
  Administered 2018-08-09: 4 mg via INTRAVENOUS
  Filled 2018-08-09: qty 2

## 2018-08-09 MED ORDER — DIPHENHYDRAMINE HCL 25 MG PO CAPS
25.0000 mg | ORAL_CAPSULE | Freq: Four times a day (QID) | ORAL | Status: DC | PRN
  Administered 2018-08-14 – 2018-08-15 (×2): 25 mg via ORAL
  Filled 2018-08-09 (×2): qty 1

## 2018-08-09 MED ORDER — ACETAMINOPHEN 650 MG RE SUPP
650.0000 mg | Freq: Four times a day (QID) | RECTAL | Status: DC | PRN
  Administered 2018-08-10: 650 mg via RECTAL
  Filled 2018-08-09: qty 1

## 2018-08-09 MED ORDER — SODIUM CHLORIDE 0.9 % IV SOLN
2.0000 g | INTRAVENOUS | Status: DC
  Administered 2018-08-10 – 2018-08-12 (×3): 2 g via INTRAVENOUS
  Filled 2018-08-09 (×3): qty 20

## 2018-08-09 MED ORDER — IOHEXOL 300 MG/ML  SOLN
100.0000 mL | Freq: Once | INTRAMUSCULAR | Status: AC | PRN
  Administered 2018-08-09: 100 mL via INTRAVENOUS

## 2018-08-09 MED ORDER — SODIUM CHLORIDE 0.9 % IV BOLUS
1000.0000 mL | Freq: Once | INTRAVENOUS | Status: AC
  Administered 2018-08-09: 1000 mL via INTRAVENOUS

## 2018-08-09 MED ORDER — ONDANSETRON HCL 4 MG/2ML IJ SOLN: 4.0000 mg | Freq: Four times a day (QID) | INTRAMUSCULAR | Status: DC | PRN

## 2018-08-09 MED ORDER — METRONIDAZOLE IN NACL 5-0.79 MG/ML-% IV SOLN
500.0000 mg | Freq: Three times a day (TID) | INTRAVENOUS | Status: DC
  Administered 2018-08-10 – 2018-08-12 (×8): 500 mg via INTRAVENOUS
  Filled 2018-08-09 (×9): qty 100

## 2018-08-09 MED ORDER — OXYCODONE HCL 5 MG PO TABS
5.0000 mg | ORAL_TABLET | ORAL | Status: DC | PRN
  Administered 2018-08-10 – 2018-08-15 (×10): 5 mg via ORAL
  Filled 2018-08-09 (×10): qty 1

## 2018-08-09 MED ORDER — ENOXAPARIN SODIUM 40 MG/0.4ML ~~LOC~~ SOLN: 40.0000 mg | Freq: Every day | SUBCUTANEOUS | Status: DC

## 2018-08-09 MED ORDER — SODIUM CHLORIDE 0.9 % IV SOLN
1.0000 g | Freq: Once | INTRAVENOUS | Status: AC
  Administered 2018-08-09: 1 g via INTRAVENOUS
  Filled 2018-08-09: qty 10

## 2018-08-09 NOTE — ED Notes (Signed)
Gave report to floor, but per charge nurse the bed is being cleaned and won't be ready for about 25 minutes.

## 2018-08-09 NOTE — ED Notes (Signed)
Patient transported to CT 

## 2018-08-09 NOTE — H&P (Signed)
Sierra Hensley is an 47 y.o. female.   Chief Complaint: abdominal pain HPI: 47 yo female with 1 week of abdominal pain. She first thought it was food poisoning but increased in intensity. It is a constant vague pain with some spikes to intense sharp pain that often leads to having diarrhea. She has diarrhea 10 times a day. She is nauseated and has vomited multiple times. She also notes fevers and chills. She has never had a pain like this before.  Past Medical History:  Diagnosis Date  . 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 CESAREAN SECTION MULTI-GESTATIONAL N/A 12/09/2014   Procedure: CESAREAN SECTION MULTI-GESTATIONAL;  Surgeon: Cheri Fowler, MD;  Location: [facility];  Service: Obstetrics;  Laterality: N/A;    History reviewed. No pertinent family history. Social History:  reports that she has never smoked. She has never used smokeless tobacco. She reports that she does not drink alcohol or use drugs.  Allergies: No Known Allergies   (Not in a hospital admission)  Results for orders placed or performed during the hospital encounter of 08/09/18 (from the past 48 hour(s))  Urinalysis, Routine w reflex microscopic     Status: Abnormal   Collection Time: 08/09/18 10:21 AM  Result Value Ref Range   Color, Urine AMBER (A) YELLOW    Comment: BIOCHEMICALS MAY BE AFFECTED BY COLOR   APPearance HAZY (A) CLEAR   Specific Gravity, Urine 1.029 1.005 - 1.030   pH 5.0 5.0 - 8.0   Glucose, UA NEGATIVE NEGATIVE mg/dL   Hgb urine dipstick LARGE (A) NEGATIVE   Bilirubin Urine NEGATIVE NEGATIVE   Ketones, ur NEGATIVE NEGATIVE mg/dL   Protein, ur 100 (A) NEGATIVE mg/dL   Nitrite NEGATIVE NEGATIVE   Leukocytes, UA NEGATIVE NEGATIVE   RBC / HPF 0-5 0 - 5 RBC/hpf   WBC, UA 11-20 0 - 5 WBC/hpf   Bacteria, UA RARE (A) NONE SEEN   Squamous Epithelial / LPF 11-20 0 - 5   Mucus PRESENT     Comment: Performed at [facility],  [address] [place], [place] [address]  Lipase, blood     Status: Abnormal   Collection Time: 08/09/18 10:24 AM  Result Value Ref Range   Lipase 55 (H) 11 - 51 U/L    Comment: Performed at [facility] [address], [place],  [address]  Comprehensive metabolic panel     Status: Abnormal   Collection Time: 08/09/18 10:24 AM  Result Value Ref Range   Sodium 136 135 - 145 mmol/L   Potassium 3.6 3.5 - 5.1 mmol/L   Chloride 101 98 - 111 mmol/L   CO2 24 22 - 32 mmol/L   Glucose, Bld 107 (H) 70 - 99 mg/dL   BUN 12 6 - 20 mg/dL   Creatinine, Ser 0.68 0.44 - 1.00 mg/dL   Calcium 8.6 (L) 8.9 - 10.3 mg/dL   Total Protein 6.3 (L) 6.5 - 8.1 g/dL   Albumin 2.5 (L) 3.5 - 5.0 g/dL   AST 32 15 - 41 U/L   ALT 62 (H) 0 - 44 U/L   Alkaline Phosphatase 140 (H) 38 - 126 U/L   Total Bilirubin 0.4 0.3 - 1.2 mg/dL   GFR calc non Af Amer >60 >60 mL/min   GFR calc Af Amer >60 >60 mL/min    Comment: (NOTE) The eGFR has been calculated using the CKD EPI equation. This calculation has  not been validated in all clinical situations. eGFR's persistently <60 mL/min signify possible Chronic Kidney Disease.    Anion gap 11 5 - 15    Comment: Performed at [facility] [address] [place], [place] [address]  CBC     Status: Abnormal   Collection Time: 08/09/18 10:24 AM  Result Value Ref Range   WBC 15.5 (H) 4.0 - 10.5 K/uL   RBC 3.72 (L) 3.87 - 5.11 MIL/uL   Hemoglobin 10.5 (L) 12.0 - 15.0 g/dL   HCT 32.3 (L) 36.0 - 46.0 %   MCV 86.8 78.0 - 100.0 fL   MCH 28.2 26.0 - 34.0 pg   MCHC 32.5 30.0 - 36.0 g/dL   RDW 13.7 11.5 - 15.5 %   Platelets 375 150 - 400 K/uL    Comment: Performed at [facility], [place] [address] [place], [place] [address]  I-Stat beta hCG blood, ED     Status: None   Collection Time: 08/09/18 10:33 AM  Result Value Ref Range   I-stat hCG, quantitative <5.0 <5 mIU/mL   Comment 3            Comment:   GEST. AGE      CONC.  (mIU/mL)   <=1 WEEK        5 -  50     2 WEEKS       50 - 500     3 WEEKS       100 - 10,000     4 WEEKS     1,000 - 30,000        FEMALE AND NON-PREGNANT FEMALE:     LESS THAN 5 mIU/mL    Ct Abdomen Pelvis W Contrast  Result Date: 08/09/2018 CLINICAL DATA:  Generalized abdominal pain for 1 week with diarrhea EXAM: CT ABDOMEN AND PELVIS WITH CONTRAST TECHNIQUE: Multidetector CT imaging of the abdomen and pelvis was performed using the standard protocol following bolus administration of intravenous contrast. CONTRAST:  134m OMNIPAQUE IOHEXOL 300 MG/ML  SOLN COMPARISON:  Ultrasound 08/11/2016 FINDINGS: Lower chest: Lung bases demonstrate no acute consolidation or pleural effusion. The heart size is normal Hepatobiliary: No focal liver abnormality is seen. No gallstones, gallbladder wall thickening, or biliary dilatation. Possible sludge in the gallbladder Pancreas: Unremarkable. No pancreatic ductal dilatation or surrounding inflammatory changes. Spleen: Normal in size without focal abnormality. Adrenals/Urinary Tract: Adrenal glands are normal. Cyst in the mid to upper left kidney. No hydronephrosis. Bladder is unremarkable. Stomach/Bowel: The stomach is nonenlarged. No dilated small bowel. Diffuse colon wall thickening suggesting mild colitis. Appendix is nonenlarged, however on coronal views, there appear to be small distal stones within expected location of the tip of the appendix with adjacent gas collection, series 6, image number 47. This is contiguous with a slightly larger fluid collection in the upper pelvis measuring 2.7 cm. Vascular/Lymphatic: Nonaneurysmal aorta. Mild retroperitoneal adenopathy. Reproductive: Uterus unremarkable. Loculated fluid collection in the left adnexal region. Other: No free air. Multiple rim enhancing fluid collections within the abdomen and pelvis. Posterior pelvic fluid collection measures 9.8 x 4.1 cm. Anterior pelvic fluid collection measures 3.6 x 5.5 cm and is contiguous with fluid that extends  into the left adnexal region. Multiple additional fluid collections are noted. Diffuse edema and soft tissue stranding within the abdominal and pelvic mesentery. Musculoskeletal: No acute or significant osseous findings. IMPRESSION: 1. Multiple rim enhancing fluid collections within the lower abdomen and pelvis as detailed above, concerning for intra-abdominal and  intrapelvic abscess. Potential source is perforated appendix as there appears to be stone in the distal appendix with adjacent gas and fluid collection. Other consideration for pelvic abscess would include PID. 2. Diffuse inflammatory changes within the pelvic and abdominal mesentery. 3. There is diffuse colon wall thickening which may be secondary to a colitis. Critical Value/emergent results were called by telephone at the time of interpretation on 08/09/2018 at 8:26 pm to Dr. Aletta Edouard , who verbally acknowledged these results. Electronically Signed   By: Donavan Foil M.D.   On: 08/09/2018 20:26    Review of Systems  Constitutional: Positive for chills, fever and malaise/fatigue.  HENT: Negative for hearing loss.   Eyes: Negative for blurred vision and double vision.  Respiratory: Negative for cough and hemoptysis.   Cardiovascular: Negative for chest pain and palpitations.  Gastrointestinal: Positive for abdominal pain, diarrhea, nausea and vomiting.  Genitourinary: Negative for dysuria and urgency.  Musculoskeletal: Negative for myalgias and neck pain.  Skin: Negative for itching and rash.  Neurological: Negative for dizziness, tingling and headaches.  Endo/Heme/Allergies: Does not bruise/bleed easily.  Psychiatric/Behavioral: Negative for depression and suicidal ideas.    Blood pressure 118/66, pulse 85, temperature 98.4 F (36.9 C), temperature source Oral, resp. rate 16, height 5' 4"  (1.626 m), weight 60.3 kg, last menstrual period 08/07/2018, SpO2 100 %. Physical Exam  Vitals reviewed. Constitutional: She is oriented to  person, place, and time. She appears well-developed and well-nourished.  HENT:  Head: Normocephalic and atraumatic.  Eyes: Pupils are equal, round, and reactive to light. Conjunctivae and EOM are normal.  Neck: Normal range of motion. Neck supple.  Cardiovascular: Normal rate and regular rhythm.  Respiratory: Effort normal and breath sounds normal.  GI: Soft. Bowel sounds are normal. She exhibits no distension. There is tenderness in the right lower quadrant and suprapubic area. There is guarding. There is no rebound.  Musculoskeletal: Normal range of motion.  Neurological: She is alert and oriented to person, place, and time.  Skin: Skin is warm and dry.  Psychiatric: She has a normal mood and affect. Her behavior is normal.     Assessment/Plan 47 yo female with perforated appendicitis with large abscess in abdomen and pelvis -admit to floor -IV abx -plan for IR drain in the morning -pain control -ok for liquids tonight  Mickeal Skinner, MD 08/09/2018, 9:26 PM

## 2018-08-09 NOTE — ED Provider Notes (Signed)
[facility]ERGENCY DEPARTMENT Provider Note   CSN: [id number] Arrival date & time: 08/09/18  1005     History   Chief Complaint Chief Complaint  Patient presents with  . Abdominal Pain  . Diarrhea    HPI GRETHEL ZENK is a 47 y.o. female.  She presents to the emergency department with complaint of 1 week of crampy low abdominal pain and multiple episodes of diarrhea.  She relates eating some food at a [place] market and then a few hours later started with crampy abdominal pain.  She is passing watery stool she said 10 times a day.  She feels feverish but has not taken her temperature.  She has had a few episodes of nausea and vomiting.  No urinary symptoms.  She went to her doctor and they gave her Lomotil with no real relief.  She says when she gets the pain it will be 10 out of 10 and then she will go have a watery bowel movement and it decreases to 3 out of 10.  No obvious blood in the stool.  No prior surgical histories.  Last menstrual period a few days ago.  The history is provided by the patient.  Abdominal Pain   This is a new problem. The current episode started more than 1 week ago. The problem occurs constantly. The problem has not changed since onset.The pain is associated with eating. The pain is located in the LLQ and RLQ. The quality of the pain is cramping. The pain is at a severity of 10/10. Associated symptoms include fever, diarrhea, nausea and vomiting. Pertinent negatives include constipation, dysuria, frequency, hematuria and headaches. Nothing aggravates the symptoms. The symptoms are relieved by bowel activity.  Diarrhea   Associated symptoms include abdominal pain and vomiting. Pertinent negatives include no headaches.    Past Medical History:  Diagnosis Date  . 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 Generalized abdominal cramping 08/03/2018  . Diarrhea of presumed  infectious origin 08/03/2018  . History of gestational diabetes 05/15/2018  . Gastroesophageal reflux disease 05/15/2018    Past Surgical History:  Procedure Laterality Date  . CESAREAN SECTION MULTI-GESTATIONAL N/A 12/09/2014   Procedure: CESAREAN SECTION MULTI-GESTATIONAL;  Surgeon: Cheri Fowler, MD;  Location: [facility];  Service: Obstetrics;  Laterality: N/A;     OB History    Gravida  1   Para  1   Term      Preterm  1   AB      Living  2     SAB      TAB      Ectopic      Multiple  1   Live Births  2            Home Medications    Prior to Admission medications   Medication Sig Start Date End Date Taking? Authorizing Provider  acetaminophen (TYLENOL) 325 MG tablet Take 650 mg by mouth every 6 (six) hours as needed.    [provider]  cetirizine (ZYRTEC) 10 MG tablet Take 1 tablet (10 mg total) by mouth daily. Patient not taking: Reported on 08/03/2018 07/26/18   McVey, Gelene Mink, PA-C  ciprofloxacin (CIPRO) 500 MG tablet Take 1 tablet (500 mg total) by mouth 2 (two) times daily. 08/03/18   Horald Pollen, MD  fluticasone [facility]) 50 MCG/ACT nasal spray Place 1 spray into both nostrils 2 (two) times daily.  Patient not taking: Reported on 08/03/2018 05/15/18   Rutherford Guys, MD  ibuprofen (ADVIL,MOTRIN) 800 MG tablet Take 1 tablet (800 mg total) by mouth every 8 (eight) hours as needed. 12/12/14   [name], Jeral Fruit, MD  meloxicam (MOBIC) 7.5 MG tablet Take 1-2 tablets (7.5-15 mg total) by mouth daily. Patient not taking: Reported on 07/26/2018 06/20/18   Tenna Delaine D, PA-C  ondansetron (ZOFRAN) 4 MG tablet Take 4 mg by mouth every 8 (eight) hours as needed for nausea or vomiting.    [provider]  predniSONE (DELTASONE) 20 MG tablet Take 3 PO QAM x2days, 2 PO QAM x2days, 1 PO QAM x2days Patient not taking: Reported on 08/03/2018 07/26/18   McVey, Gelene Mink, PA-C  ranitidine (ZANTAC) 150 MG tablet Take 1 tablet  (150 mg total) by mouth 2 (two) times daily. Patient not taking: Reported on 08/03/2018 07/26/18   McVey, Gelene Mink, PA-C    Family History History reviewed. No pertinent family history.  Social History Social History   Tobacco Use  . Smoking status: Never Smoker  . Smokeless tobacco: Never Used  Substance Use Topics  . Alcohol use: No  . Drug use: No     Allergies   Patient has no known allergies.   Review of Systems Review of Systems  Constitutional: Positive for fever.  HENT: Negative for sore throat.   Eyes: Negative for visual disturbance.  Respiratory: Negative for shortness of breath.   Cardiovascular: Negative for chest pain.  Gastrointestinal: Positive for abdominal pain, diarrhea, nausea and vomiting. Negative for constipation.  Genitourinary: Negative for dysuria, frequency and hematuria.  Musculoskeletal: Negative for back pain.  Skin: Negative for rash.  Neurological: Negative for headaches.     Physical Exam Updated Vital Signs BP 112/70   Pulse 82   Temp 98.4 F (36.9 C) (Oral)   Resp 20   Ht _0  (1.626 m)   Wt 60.3 kg   LMP 08/07/2018 (Exact Date)   SpO2 100%   BMI 22.83 kg/m   Physical Exam  Constitutional: She appears well-developed and well-nourished. No distress.  HENT:  Head: Normocephalic and atraumatic.  Eyes: Conjunctivae are normal.  Neck: Neck supple.  Cardiovascular: Normal rate and regular rhythm.  No murmur heard. Pulmonary/Chest: Effort normal and breath sounds normal. No respiratory distress.  Abdominal: Soft. There is tenderness in the right lower quadrant and left lower quadrant. There is no rigidity, no rebound and no guarding.  Musculoskeletal: She exhibits no edema or deformity.  Neurological: She is alert.  Skin: Skin is warm and dry. Capillary refill takes less than 2 seconds.  Psychiatric: She has a normal mood and affect.  Nursing note and vitals reviewed.    ED Treatments / Results  Labs (all labs  ordered are listed, but only abnormal results are displayed) Labs Reviewed  LIPASE, BLOOD - Abnormal; Notable for the following components:      Result Value   Lipase 55 (*)    All other components within normal limits  COMPREHENSIVE METABOLIC PANEL - Abnormal; Notable for the following components:   Glucose, Bld 107 (*)    Calcium 8.6 (*)    Total Protein 6.3 (*)    Albumin 2.5 (*)    ALT 62 (*)    Alkaline Phosphatase 140 (*)    All other components within normal limits  CBC - Abnormal; Notable for the following components:   WBC 15.5 (*)    RBC 3.72 (*)    Hemoglobin 10.5 (*)  HCT 32.3 (*)    All other components within normal limits  URINALYSIS, ROUTINE W REFLEX MICROSCOPIC - Abnormal; Notable for the following components:   Color, Urine AMBER (*)    APPearance HAZY (*)    Hgb urine dipstick LARGE (*)    Protein, ur 100 (*)    Bacteria, UA RARE (*)    All other components within normal limits  GASTROINTESTINAL PANEL BY PCR, STOOL (REPLACES STOOL CULTURE)  I-STAT BETA HCG BLOOD, ED ([facility], [facility], [facility] ONLY)    EKG None  Radiology Ct Abdomen Pelvis W Contrast  Result Date: 08/09/2018 CLINICAL DATA:  Generalized abdominal pain for 1 week with diarrhea EXAM: CT ABDOMEN AND PELVIS WITH CONTRAST TECHNIQUE: Multidetector CT imaging of the abdomen and pelvis was performed using the standard protocol following bolus administration of intravenous contrast. CONTRAST:  147m OMNIPAQUE IOHEXOL 300 MG/ML  SOLN COMPARISON:  Ultrasound 08/11/2016 FINDINGS: Lower chest: Lung bases demonstrate no acute consolidation or pleural effusion. The heart size is normal Hepatobiliary: No focal liver abnormality is seen. No gallstones, gallbladder wall thickening, or biliary dilatation. Possible sludge in the gallbladder Pancreas: Unremarkable. No pancreatic ductal dilatation or surrounding inflammatory changes. Spleen: Normal in size without focal abnormality. Adrenals/Urinary Tract: Adrenal glands are  normal. Cyst in the mid to upper left kidney. No hydronephrosis. Bladder is unremarkable. Stomach/Bowel: The stomach is nonenlarged. No dilated small bowel. Diffuse colon wall thickening suggesting mild colitis. Appendix is nonenlarged, however on coronal views, there appear to be small distal stones within expected location of the tip of the appendix with adjacent gas collection, series 6, image number 47. This is contiguous with a slightly larger fluid collection in the upper pelvis measuring 2.7 cm. Vascular/Lymphatic: Nonaneurysmal aorta. Mild retroperitoneal adenopathy. Reproductive: Uterus unremarkable. Loculated fluid collection in the left adnexal region. Other: No free air. Multiple rim enhancing fluid collections within the abdomen and pelvis. Posterior pelvic fluid collection measures 9.8 x 4.1 cm. Anterior pelvic fluid collection measures 3.6 x 5.5 cm and is contiguous with fluid that extends into the left adnexal region. Multiple additional fluid collections are noted. Diffuse edema and soft tissue stranding within the abdominal and pelvic mesentery. Musculoskeletal: No acute or significant osseous findings. IMPRESSION: 1. Multiple rim enhancing fluid collections within the lower abdomen and pelvis as detailed above, concerning for intra-abdominal and intrapelvic abscess. Potential source is perforated appendix as there appears to be stone in the distal appendix with adjacent gas and fluid collection. Other consideration for pelvic abscess would include PID. 2. Diffuse inflammatory changes within the pelvic and abdominal mesentery. 3. There is diffuse colon wall thickening which may be secondary to a colitis. Critical Value/emergent results were called by telephone at the time of interpretation on 08/09/2018 at 8:26 pm to Dr. MAletta Edouard, who verbally acknowledged these results. Electronically Signed   By: KDonavan FoilM.D.   On: 08/09/2018 20:26    Procedures Procedures (including critical care  time)  Medications Ordered in ED Medications  sodium chloride 0.9 % bolus 1,000 mL (has no administration in time range)  morphine 4 MG/ML injection 4 mg (has no administration in time range)  ondansetron (ZOFRAN) injection 4 mg (has no administration in time range)     Initial Impression / Assessment and Plan / ED Course  I have reviewed the triage vital signs and the nursing notes.  Pertinent labs & imaging results that were available during my care of the patient were reviewed by me and considered in my medical decision  making (see chart for details).  Clinical Course as of Aug 10 1045  Thu Aug 09, 6750  6776 47 year old female with no significant past medical history here with 1 week of crampy low abdominal pain and diarrhea.  Her lab work shows an elevation of the white blood cell count 15.5 and a slightly lower hematocrit than prior.  Her ALT is 62 and alk phos is slightly elevated 2.  Lipase is slightly above normal at 55.  Am ordering her some fluids pain medicine and nausea medicine and put her in for a CT abdomen and pelvis.  Also ordered stool studies if she can produce a sample we will send that off here.   [MB]  2058 Consulted general surgery Dr. Kieth Brightly and he is at bedside now evaluating the patient via vitamins interpreter on the iPad.   [MB]    Clinical Course User Index [MB] Hayden Rasmussen, MD     Final Clinical Impressions(s) / ED Diagnoses   Final diagnoses:  Acute appendicitis with perforation and peritoneal abscess    ED Discharge Orders    None       Hayden Rasmussen, MD 08/10/18 1047

## 2018-08-09 NOTE — ED Triage Notes (Signed)
Pt endorses generalized abd pain x 1 week with diarrhea, seen pcp earlier this week, placed on antidiarrhea medication with some relief. Still having pain. VSS

## 2018-08-10 ENCOUNTER — Inpatient Hospital Stay (HOSPITAL_COMMUNITY): Payer: BLUE CROSS/BLUE SHIELD

## 2018-08-10 ENCOUNTER — Other Ambulatory Visit: Payer: Self-pay

## 2018-08-10 LAB — GASTROINTESTINAL PANEL BY PCR, STOOL (REPLACES STOOL CULTURE)

## 2018-08-10 LAB — COMPREHENSIVE METABOLIC PANEL
ALT: 43 U/L (ref 0–44)
AST: 17 U/L (ref 15–41)
Albumin: 2.1 g/dL — ABNORMAL LOW (ref 3.5–5.0)
Alkaline Phosphatase: 124 U/L (ref 38–126)
Anion gap: 8 (ref 5–15)
BUN: 7 mg/dL (ref 6–20)
CHLORIDE: 104 mmol/L (ref 98–111)
CO2: 25 mmol/L (ref 22–32)
CREATININE: 0.71 mg/dL (ref 0.44–1.00)
Calcium: 8.1 mg/dL — ABNORMAL LOW (ref 8.9–10.3)
GFR calc Af Amer: >60 mL/min (ref >60)
Glucose, Bld: 110 mg/dL — ABNORMAL HIGH (ref 70–99)
Potassium: 3.8 mmol/L (ref 3.5–5.1)
Sodium: 137 mmol/L (ref 135–145)
Total Bilirubin: 0.7 mg/dL (ref 0.3–1.2)
Total Protein: 5.5 g/dL — ABNORMAL LOW (ref 6.5–8.1)

## 2018-08-10 LAB — MRSA PCR SCREENING: MRSA by PCR: NEGATIVE

## 2018-08-10 LAB — CBC
HCT: 30.1 % — ABNORMAL LOW (ref 36.0–46.0)
Hemoglobin: 9.7 g/dL — ABNORMAL LOW (ref 12.0–15.0)
MCH: 27.8 pg (ref 26.0–34.0)
MCHC: 32.2 g/dL (ref 30.0–36.0)
MCV: 86.2 fL (ref 78.0–100.0)
PLATELETS: 409 10*3/uL — AB (ref 150–400)
RBC: 3.49 MIL/uL — ABNORMAL LOW (ref 3.87–5.11)
RDW: 14 % (ref 11.5–15.5)
WBC: 15.7 10*3/uL — AB (ref 4.0–10.5)

## 2018-08-10 LAB — HIV ANTIBODY (ROUTINE TESTING W REFLEX): HIV Screen 4th Generation wRfx: NONREACTIVE

## 2018-08-10 LAB — PROTIME-INR
INR: 1.11
Prothrombin Time: 14.2 seconds (ref 11.4–15.2)

## 2018-08-10 LAB — HEPATITIS PANEL, ACUTE
HCV Ab: <0.1 s/co ratio (ref 0.0–0.9)
HEP A IGM: NEGATIVE
HEP B C IGM: NEGATIVE
HEP B S AG: NEGATIVE

## 2018-08-10 MED ORDER — MIDAZOLAM HCL 2 MG/2ML IJ SOLN
INTRAMUSCULAR | Status: AC
  Filled 2018-08-10: qty 4

## 2018-08-10 MED ORDER — MIDAZOLAM HCL 2 MG/2ML IJ SOLN
INTRAMUSCULAR | Status: AC | PRN
  Administered 2018-08-10: 1 mg via INTRAVENOUS
  Administered 2018-08-10 (×2): 0.5 mg via INTRAVENOUS

## 2018-08-10 MED ORDER — FENTANYL CITRATE (PF) 100 MCG/2ML IJ SOLN
INTRAMUSCULAR | Status: AC
  Filled 2018-08-10: qty 4

## 2018-08-10 MED ORDER — FENTANYL CITRATE (PF) 100 MCG/2ML IJ SOLN
INTRAMUSCULAR | Status: AC | PRN
  Administered 2018-08-10: 25 ug via INTRAVENOUS
  Administered 2018-08-10: 50 ug via INTRAVENOUS
  Administered 2018-08-10: 25 ug via INTRAVENOUS

## 2018-08-10 MED ORDER — LIDOCAINE HCL 1 % IJ SOLN
INTRAMUSCULAR | Status: AC
  Filled 2018-08-10: qty 20

## 2018-08-10 MED ORDER — SODIUM CHLORIDE 0.9% FLUSH
5.0000 mL | Freq: Three times a day (TID) | INTRAVENOUS | Status: DC
  Administered 2018-08-11 – 2018-08-21 (×26): 5 mL

## 2018-08-10 MED ORDER — ENOXAPARIN SODIUM 40 MG/0.4ML ~~LOC~~ SOLN
40.0000 mg | Freq: Every day | SUBCUTANEOUS | Status: DC
  Administered 2018-08-11 – 2018-08-15 (×5): 40 mg via SUBCUTANEOUS
  Filled 2018-08-10 (×5): qty 0.4

## 2018-08-10 NOTE — Sedation Documentation (Signed)
Patient is resting comfortably. 

## 2018-08-10 NOTE — Sedation Documentation (Signed)
Patient denies pain and is resting comfortably.  

## 2018-08-10 NOTE — Progress Notes (Signed)
RN spoke with MD regarding pt's temp. MD asked if pt had drain placed today. RN informed him pt had two drains placed today. MD states this is normal and to monitor pt. RN will continue to monitor.

## 2018-08-10 NOTE — Progress Notes (Signed)
RN called [facility] answering service. RN informed answering service of pt info and temp. Answering service states they will let the MD know and give him RN's callback number.

## 2018-08-10 NOTE — Progress Notes (Signed)
Initial Nutrition Assessment  DOCUMENTATION CODES:   Not applicable  INTERVENTION:    Advance diet as medically appropriate, add supplements when able  NUTRITION DIAGNOSIS:   Increased nutrient needs related to acute illness(perforated appendicitis with multiple abscesses) as evidenced by estimated needs  GOAL:   Patient will meet greater than or equal to 90% of their needs  MONITOR:   Diet advancement, PO intake, Supplement acceptance, Labs, Skin, Weight trends, I & O's  REASON FOR ASSESSMENT:   Malnutrition Screening Tool  ASSESSMENT:   47 y.o. Female to ED with complaint of 1 week of crampy low abdominal pain and multiple episodes of diarrhea.   Pt admitted with Acute appendicitis with perforation and peritoneal abscess [K35.33].  Pt sleeping upon RD visit. She just returned to her room. S/p placement of 10 F drain into peritoneum/pelvic fluid per IR. PTA the pt was experiencing poor appetite, N/V and abdominal pain.  Per malnutrition screening, pt with recent unintentional weight loss. Per weight encounters pt's weight has been stable since 04/2018. Labs & medications reviewed.  NUTRITION - FOCUSED PHYSICAL EXAM:  Unable to complete at this time.  Diet Order:   Diet Order            Diet NPO time specified  Diet effective midnight             EDUCATION NEEDS:   Not appropriate for education at this time  Skin:  Skin Assessment: Reviewed RN Assessment  Last BM:  9/12 diarrhea   Wt Readings from Last 5 Encounters:  08/10/18 64.6 kg  08/03/18 60.5 kg  07/26/18 61.7 kg  06/20/18 60.1 kg  05/15/18 60.6 kg   Height:   Ht Readings from Last 1 Encounters:  08/10/18 5\' 4"  (1.626 m)   Weight:   Wt Readings from Last 1 Encounters:  08/10/18 64.6 kg   BMI:  Body mass index is 24.45 kg/m.  Estimated Nutritional Needs:   Kcal:  1700-1900  Protein:  80-95 gm  Fluid:  1.7-1.9 L  Maureen ChattersKatie Beyonce Sawatzky, RD, LDN Pager #: [phone] After-Hours Pager  #: [phone]

## 2018-08-10 NOTE — Procedures (Signed)
Interventional Radiology Procedure Note  Procedure: Placement of left anterior 29F drain into pelvic fluid.  Placement of left transgluteal drain into pelvic fluid.    Cx's sent.  .  Complications: None Recommendations:  - f/u cx. - routine drain care - Do not submerge - Routine wound care   Signed,  Yvone NeuJaime S. Loreta AveWagner, DO

## 2018-08-10 NOTE — Progress Notes (Signed)
[facility] Progress Note     Subjective: CC-  Patient complaining of persistent right sided abdominal pain and nausea. States that she had 10 bouts of diarrhea yesterday. CT scan yesterday concerning for perforated appendicitis with multiple fluid collections. IR consulted for perc drain.  Objective: Vital signs in last 24 hours: Temp:  [98.4 F (36.9 C)-99.2 F (37.3 C)] 99.2 F (37.3 C) (09/13 0427) Pulse Rate:  [72-89] 87 (09/13 0427) Resp:  [16-20] 16 (09/13 0427) BP: (93-118)/(60-76) 99/60 (09/13 0427) SpO2:  [98 %-100 %] 98 % (09/13 0427) Weight:  [60.3 kg-64.6 kg] 64.6 kg (09/13 0113) Last BM Date: 08/09/18  Intake/Output from previous day: 09/12 0701 - 09/13 0700 In: -  Out: 400 [Urine:400] Intake/Output this shift: No intake/output data recorded.  PE: Gen:  Alert, NAD, pleasant HEENT: EOM's intact, pupils equal and round Card:  2+ DP pulses Pulm:  effort normal Abd: Soft, mild distension, TTP RLQ and RUQ without rebound or guarding Ext:  Calves soft and nontender Psych: A&Ox3  Skin: no rashes noted, warm and dry  Lab Results:  Recent Labs    08/09/18 1024 08/10/18 0401  WBC 15.5* 15.7*  HGB 10.5* 9.7*  HCT 32.3* 30.1*  PLT 375 409*   BMET Recent Labs    08/09/18 1024 08/10/18 0401  NA 136 137  K 3.6 3.8  CL 101 104  CO2 24 25  GLUCOSE 107* 110*  BUN 12 7  CREATININE 0.68 0.71  CALCIUM 8.6* 8.1*   PT/INR Recent Labs    08/10/18 0639  LABPROT 14.2  INR 1.11   CMP     Component Value Date/Time   NA 137 08/10/2018 0401   NA 138 05/15/2018 0954   K 3.8 08/10/2018 0401   CL 104 08/10/2018 0401   CO2 25 08/10/2018 0401   GLUCOSE 110 (H) 08/10/2018 0401   BUN 7 08/10/2018 0401   BUN 10 05/15/2018 0954   CREATININE 0.71 08/10/2018 0401   CALCIUM 8.1 (L) 08/10/2018 0401   PROT 5.5 (L) 08/10/2018 0401   PROT 7.2 05/15/2018 0954   ALBUMIN 2.1 (L) 08/10/2018 0401   ALBUMIN 4.1 05/15/2018 0954   AST 17 08/10/2018 0401    ALT 43 08/10/2018 0401   ALKPHOS 124 08/10/2018 0401   BILITOT 0.7 08/10/2018 0401   BILITOT 0.4 05/15/2018 0954   GFRNONAA >60 08/10/2018 0401   GFRAA >60 08/10/2018 0401   Lipase     Component Value Date/Time   LIPASE 55 (H) 08/09/2018 1024       Studies/Results: Ct Abdomen Pelvis W Contrast  Result Date: 08/09/2018 CLINICAL DATA:  Generalized abdominal pain for 1 week with diarrhea EXAM: CT ABDOMEN AND PELVIS WITH CONTRAST TECHNIQUE: Multidetector CT imaging of the abdomen and pelvis was performed using the standard protocol following bolus administration of intravenous contrast. CONTRAST:  100mL OMNIPAQUE IOHEXOL 300 MG/ML  SOLN COMPARISON:  Ultrasound 08/11/2016 FINDINGS: Lower chest: Lung bases demonstrate no acute consolidation or pleural effusion. The heart size is normal Hepatobiliary: No focal liver abnormality is seen. No gallstones, gallbladder wall thickening, or biliary dilatation. Possible sludge in the gallbladder Pancreas: Unremarkable. No pancreatic ductal dilatation or surrounding inflammatory changes. Spleen: Normal in size without focal abnormality. Adrenals/Urinary Tract: Adrenal glands are normal. Cyst in the mid to upper left kidney. No hydronephrosis. Bladder is unremarkable. Stomach/Bowel: The stomach is nonenlarged. No dilated small bowel. Diffuse colon wall thickening suggesting mild colitis. Appendix is nonenlarged, however on coronal views, there appear to be small  distal stones within expected location of the tip of the appendix with adjacent gas collection, series 6, image number 47. This is contiguous with a slightly larger fluid collection in the upper pelvis measuring 2.7 cm. Vascular/Lymphatic: Nonaneurysmal aorta. Mild retroperitoneal adenopathy. Reproductive: Uterus unremarkable. Loculated fluid collection in the left adnexal region. Other: No free air. Multiple rim enhancing fluid collections within the abdomen and pelvis. Posterior pelvic fluid collection  measures 9.8 x 4.1 cm. Anterior pelvic fluid collection measures 3.6 x 5.5 cm and is contiguous with fluid that extends into the left adnexal region. Multiple additional fluid collections are noted. Diffuse edema and soft tissue stranding within the abdominal and pelvic mesentery. Musculoskeletal: No acute or significant osseous findings. IMPRESSION: 1. Multiple rim enhancing fluid collections within the lower abdomen and pelvis as detailed above, concerning for intra-abdominal and intrapelvic abscess. Potential source is perforated appendix as there appears to be stone in the distal appendix with adjacent gas and fluid collection. Other consideration for pelvic abscess would include PID. 2. Diffuse inflammatory changes within the pelvic and abdominal mesentery. 3. There is diffuse colon wall thickening which may be secondary to a colitis. Critical Value/emergent results were called by telephone at the time of interpretation on 08/09/2018 at 8:26 pm to Dr. Meridee Score , who verbally acknowledged these results. Electronically Signed   By: Jasmine Pang M.D.   On: 08/09/2018 20:26    Anti-infectives: Anti-infectives (From admission, onward)   Start     Dose/Rate Route Frequency Ordered Stop   08/10/18 1000  cefTRIAXone (ROCEPHIN) 2 g in sodium chloride 0.9 % 100 mL IVPB     2 g 200 mL/hr over 30 Minutes Intravenous Every 24 hours 08/09/18 2230     08/10/18 0600  metroNIDAZOLE (FLAGYL) IVPB 500 mg     500 mg 100 mL/hr over 60 Minutes Intravenous Every 8 hours 08/09/18 2230     08/09/18 2030  cefTRIAXone (ROCEPHIN) 1 g in sodium chloride 0.9 % 100 mL IVPB     1 g 200 mL/hr over 30 Minutes Intravenous  Once 08/09/18 2024 08/09/18 2119   08/09/18 2030  metroNIDAZOLE (FLAGYL) IVPB 500 mg     500 mg 100 mL/hr over 60 Minutes Intravenous  Once 08/09/18 2024 08/09/18 2236       Assessment/Plan Perforated appendicitis with multiple abscesses - IR consult pending for possible perc drain of abscess(es).  Continue IVF, abx, and keep NPO for possible procedure. Ok to return to [facility] after procedure. Repeat labs in AM.  Diarrhea - GI panel pending  ID - rocephin/flagyl 9/12>> FEN - IVF, NPO for procedure VTE - SCDs, lovenox Foley - none   LOS: 1 day    Franne Forts , [facility] 08/10/2018, 9:32 AM Pager: [phone] Consults: [phone] Mon 7:00 am -11:30 AM Tues-Fri 7:00 am-4:30 pm Sat-Sun 7:00 am-11:30 am

## 2018-08-10 NOTE — Progress Notes (Signed)
Pt's temp is 103.1. Pt states she "feels hot" but otherwise feels normal. RN gave pt Tylenol suppository and will recheck temp. RN paged MD for further instruction.

## 2018-08-10 NOTE — Consult Note (Signed)
Chief Complaint: Patient was seen in consultation today for intra abdominal abscess drains x2 Chief Complaint  Patient presents with  . Abdominal Pain  . Diarrhea   at the request of Dr Gena FrayL Kinsinger   Supervising Physician: Gilmer MorWagner, Jaime  [facility]  History of Present Illness: Sierra Hensley is a 47 y.o. female   Perforated appendix Intra abdominal abscesses  CT yesterday:    IMPRESSION: 1. Multiple rim enhancing fluid collections within the lower abdomen and pelvis as detailed above, concerning for intra-abdominal and intrapelvic abscess. Potential source is perforated appendix as there appears to be stone in the distal appendix with adjacent gas and fluid collection. Other consideration for pelvic abscess would include PID. 2. Diffuse inflammatory changes within the pelvic and abdominal mesentery. 3. There is diffuse colon wall thickening which may be secondary to a colitis.  Request per [facility] for abdominal abscess drain Dr Loreta AveWagner has reviewed imaging and approves procedure Possibly 2 drains   Past Medical History:  Diagnosis Date  . 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 CESAREAN SECTION MULTI-GESTATIONAL N/A 12/09/2014   Procedure: CESAREAN SECTION MULTI-GESTATIONAL;  Surgeon: Lavina Hammanodd Meisinger, MD;  Location: [facility];  Service: Obstetrics;  Laterality: N/A;    Allergies: Patient has no known allergies.  Medications: Prior to Admission medications   Medication Sig Start Date End Date Taking? Authorizing Provider  acetaminophen (TYLENOL) 325 MG tablet Take 650 mg by mouth every 6 (six) hours as needed (for pain or fever).    Yes [provider]  diphenoxylate-atropine (LOMOTIL) 2.5-0.025 MG tablet Take 2 tablets by mouth 3 (three) times daily as needed for diarrhea or loose stools.   Yes [provider]  doxycycline (VIBRAMYCIN) 100 MG capsule Take 100 mg by mouth  2 (two) times daily. FOR 10 DAYS 08/06/18 08/15/18 Yes [provider]  fluticasone (FLONASE) 50 MCG/ACT nasal spray Place 1 spray into both nostrils 2 (two) times daily. Patient not taking: Reported on 08/09/2018 05/15/18   Myles LippsSantiago, Irma M, MD  meloxicam (MOBIC) 7.5 MG tablet Take 1-2 tablets (7.5-15 mg total) by mouth daily. Patient not taking: Reported on 08/09/2018 06/20/18   Benjiman CoreWiseman, Brittany D, PA-C  ondansetron (ZOFRAN) 4 MG tablet Take 4 mg by mouth every 8 (eight) hours as needed for nausea or vomiting.    [provider]  predniSONE (DELTASONE) 20 MG tablet Take 3 PO QAM x2days, 2 PO QAM x2days, 1 PO QAM x2days 07/26/18   McVey, Madelaine BhatElizabeth Whitney, PA-C  ranitidine (ZANTAC) 150 MG tablet Take 1 tablet (150 mg total) by mouth 2 (two) times daily. Patient not taking: Reported on 08/09/2018 07/26/18   McVey, Madelaine BhatElizabeth Whitney, PA-C     History reviewed. No pertinent family history.  Social History   Socioeconomic History  . Marital status: Married    Spouse name: Not on file  . Number of children: 3  . Years of education: Not on file  . Highest education level: Not on file  Occupational History  . Not on file  Social Needs  . Financial resource strain: Not on file  . Food insecurity:    Worry: Not on file    Inability: Not on file  . Transportation needs:    Medical: Not on file    Non-medical: Not on file  Tobacco Use  . Smoking status: Never Smoker  . Smokeless tobacco: Never Used  Substance and Sexual Activity  .  Alcohol use: No  . Drug use: No  . Sexual activity: Yes  Lifestyle  . Physical activity:    Days per week: Not on file    Minutes per session: Not on file  . Stress: Not on file  Relationships  . Social connections:    Talks on phone: Not on file    Gets together: Not on file    Attends religious service: Not on file    Active member of club or organization: Not on file    Attends meetings of clubs or organizations: Not on file     Relationship status: Not on file  Other Topics Concern  . Not on file  Social History Narrative  . Not on file    Review of Systems: A 12 point ROS discussed and pertinent positives are indicated in the HPI above.  All other systems are negative.  Review of Systems  Constitutional: Positive for activity change, appetite change, fatigue and fever.  Gastrointestinal: Positive for abdominal pain and nausea.  Psychiatric/Behavioral: Negative for behavioral problems and confusion.    Vital Signs: BP 99/60 (BP Location: Right Arm)   Pulse 87   Temp 99.2 F (37.3 C) (Oral)   Resp 16   Ht 5\' 4"  (1.626 m)   Wt 142 lb 6.7 oz (64.6 kg)   LMP 08/07/2018 (Exact Date)   SpO2 98%   BMI 24.45 kg/m   Physical Exam  Constitutional: She is oriented to person, place, and time.  Cardiovascular: Normal rate and regular rhythm.  Pulmonary/Chest: Effort normal and breath sounds normal.  Abdominal: Normal appearance. Bowel sounds are decreased. There is tenderness.  Neurological: She is alert and oriented to person, place, and time.  Skin: Skin is warm and dry.  Psychiatric: She has a normal mood and affect. Her behavior is normal.  Nursing note and vitals reviewed.   Imaging: Ct Abdomen Pelvis W Contrast  Result Date: 08/09/2018 CLINICAL DATA:  Generalized abdominal pain for 1 week with diarrhea EXAM: CT ABDOMEN AND PELVIS WITH CONTRAST TECHNIQUE: Multidetector CT imaging of the abdomen and pelvis was performed using the standard protocol following bolus administration of intravenous contrast. CONTRAST:  OMNIPAQUE IOHEXOL 300 MG/ML  SOLN COMPARISON:  Ultrasound 08/11/2016 FINDINGS: Lower chest: Lung bases demonstrate no acute consolidation or pleural effusion. The heart size is normal Hepatobiliary: No focal liver abnormality is seen. No gallstones, gallbladder wall thickening, or biliary dilatation. Possible sludge in the gallbladder Pancreas: Unremarkable. No pancreatic ductal dilatation or  surrounding inflammatory changes. Spleen: Normal in size without focal abnormality. Adrenals/Urinary Tract: Adrenal glands are normal. Cyst in the mid to upper left kidney. No hydronephrosis. Bladder is unremarkable. Stomach/Bowel: The stomach is nonenlarged. No dilated small bowel. Diffuse colon wall thickening suggesting mild colitis. Appendix is nonenlarged, however on coronal views, there appear to be small distal stones within expected location of the tip of the appendix with adjacent gas collection, series 6, image number 47. This is contiguous with a slightly larger fluid collection in the upper pelvis measuring 2.7 cm. Vascular/Lymphatic: Nonaneurysmal aorta. Mild retroperitoneal adenopathy. Reproductive: Uterus unremarkable. Loculated fluid collection in the left adnexal region. Other: No free air. Multiple rim enhancing fluid collections within the abdomen and pelvis. Posterior pelvic fluid collection measures 9.8 x 4.1 cm. Anterior pelvic fluid collection measures 3.6 x 5.5 cm and is contiguous with fluid that extends into the left adnexal region. Multiple additional fluid collections are noted. Diffuse edema and soft tissue stranding within the abdominal and pelvic mesentery.  Musculoskeletal: No acute or significant osseous findings. IMPRESSION: 1. Multiple rim enhancing fluid collections within the lower abdomen and pelvis as detailed above, concerning for intra-abdominal and intrapelvic abscess. Potential source is perforated appendix as there appears to be stone in the distal appendix with adjacent gas and fluid collection. Other consideration for pelvic abscess would include PID. 2. Diffuse inflammatory changes within the pelvic and abdominal mesentery. 3. There is diffuse colon wall thickening which may be secondary to a colitis. Critical Value/emergent results were called by telephone at the time of interpretation on 08/09/2018 at 8:26 pm to Dr. Meridee Score , who verbally acknowledged these  results. Electronically Signed   By: Jasmine Pang M.D.   On: 08/09/2018 20:26    Labs:  CBC: Recent Labs    05/15/18 0954 08/03/18 1418 08/09/18 1024 08/10/18 0401  WBC 4.3 6.4 15.5* 15.7*  HGB 12.2 13.1 10.5* 9.7*  HCT 36.8 38.4 32.3* 30.1*  PLT 238  --  375 409*    COAGS: Recent Labs    08/10/18 0639  INR 1.11    BMP: Recent Labs    05/15/18 0954 08/09/18 1024 08/10/18 0401  NA 138 136 137  K 5.0 3.6 3.8  CL 102 101 104  CO2 25 24 25   GLUCOSE 92 107* 110*  BUN 10 12 7   CALCIUM 9.3 8.6* 8.1*  CREATININE 0.64 0.68 0.71  GFRNONAA 107 >60 >60  GFRAA 124 >60 >60    LIVER FUNCTION TESTS: Recent Labs    05/15/18 0954 08/09/18 1024 08/10/18 0401  BILITOT 0.4 0.4 0.7  AST 20 32 17  ALT 17 62* 43  ALKPHOS 51 140* 124  PROT 7.2 6.3* 5.5*  ALBUMIN 4.1 2.5* 2.1*    TUMOR MARKERS: No results for input(s): AFPTM, CEA, CA199, CHROMGRNA in the last 8760 hours.  Assessment and Plan:  Intra abdominal abscesses Perforated appendix Scheduled for drain (s) placement Risks and benefits discussed with the patient via phone interpreter including bleeding, infection, damage to adjacent structures, bowel perforation/fistula connection, and sepsis.  All of the patient's questions were answered, patient is agreeable to proceed. Consent signed and in chart.  Thank you for this interesting consult.  I greatly enjoyed meeting TOLA MEAS and look forward to participating in their care.  A copy of this report was sent to the requesting provider on this date.  Electronically Signed: Robet Leu, PA-C 08/10/2018, 10:02 AM   I spent a total of 40 Minutes    in face to face in clinical consultation, greater than 50% of which was counseling/coordinating care for intra abd abscess drains

## 2018-08-11 LAB — CBC
HEMATOCRIT: 27.3 % — AB (ref 36.0–46.0)
HEMOGLOBIN: 9 g/dL — AB (ref 12.0–15.0)
MCH: 28.5 pg (ref 26.0–34.0)
MCHC: 33 g/dL (ref 30.0–36.0)
MCV: 86.4 fL (ref 78.0–100.0)
Platelets: 360 10*3/uL (ref 150–400)
RBC: 3.16 MIL/uL — AB (ref 3.87–5.11)
RDW: 13.6 % (ref 11.5–15.5)
WBC: 10.5 10*3/uL (ref 4.0–10.5)

## 2018-08-11 LAB — COMPREHENSIVE METABOLIC PANEL
ALT: 26 U/L (ref 0–44)
AST: 12 U/L — ABNORMAL LOW (ref 15–41)
Albumin: 1.9 g/dL — ABNORMAL LOW (ref 3.5–5.0)
Alkaline Phosphatase: 93 U/L (ref 38–126)
Anion gap: 7 (ref 5–15)
BUN: 8 mg/dL (ref 6–20)
CHLORIDE: 103 mmol/L (ref 98–111)
CO2: 25 mmol/L (ref 22–32)
Calcium: 8.1 mg/dL — ABNORMAL LOW (ref 8.9–10.3)
Creatinine, Ser: 0.6 mg/dL (ref 0.44–1.00)
Glucose, Bld: 109 mg/dL — ABNORMAL HIGH (ref 70–99)
POTASSIUM: 3.9 mmol/L (ref 3.5–5.1)
SODIUM: 135 mmol/L (ref 135–145)
Total Bilirubin: 0.8 mg/dL (ref 0.3–1.2)
Total Protein: 5.3 g/dL — ABNORMAL LOW (ref 6.5–8.1)

## 2018-08-11 NOTE — Plan of Care (Signed)

## 2018-08-11 NOTE — Progress Notes (Signed)
Referring Physician(s): Kinsinger, De BlanchLuke Aaron  Supervising Physician: Simonne ComeWatts, John  [facility]  Chief Complaint: Tenderness  Subjective:  Intraabdominal abscesses s/p drain placement x2 08/09/2018 with Dr. Loreta AveWagner. Patient awake and alert laying in bed. Complains of mild tenderness at drain incision sites. Both intraabdominal abscess drains c/d/i.   Allergies: Patient has no known allergies.  Medications: Prior to Admission medications   Medication Sig Start Date End Date Taking? Authorizing Provider  acetaminophen (TYLENOL) 325 MG tablet Take 650 mg by mouth every 6 (six) hours as needed (for pain or fever).    Yes [provider]  diphenoxylate-atropine (LOMOTIL) 2.5-0.025 MG tablet Take 2 tablets by mouth 3 (three) times daily as needed for diarrhea or loose stools.   Yes [provider]  doxycycline (VIBRAMYCIN) 100 MG capsule Take 100 mg by mouth 2 (two) times daily. FOR 10 DAYS 08/06/18 08/15/18 Yes [provider]  fluticasone (FLONASE) 50 MCG/ACT nasal spray Place 1 spray into both nostrils 2 (two) times daily. Patient not taking: Reported on 08/09/2018 05/15/18   Myles LippsSantiago, Irma M, MD  meloxicam (MOBIC) 7.5 MG tablet Take 1-2 tablets (7.5-15 mg total) by mouth daily. Patient not taking: Reported on 08/09/2018 06/20/18   Benjiman CoreWiseman, Brittany D, PA-C  ondansetron (ZOFRAN) 4 MG tablet Take 4 mg by mouth every 8 (eight) hours as needed for nausea or vomiting.    [provider]  predniSONE (DELTASONE) 20 MG tablet Take 3 PO QAM x2days, 2 PO QAM x2days, 1 PO QAM x2days 07/26/18   McVey, Madelaine BhatElizabeth Whitney, PA-C  ranitidine (ZANTAC) 150 MG tablet Take 1 tablet (150 mg total) by mouth 2 (two) times daily. Patient not taking: Reported on 08/09/2018 07/26/18   McVey, Madelaine BhatElizabeth Whitney, PA-C     Vital Signs: BP 100/62 (BP Location: Right Arm)   Pulse 81   Temp 99 F (37.2 C) (Oral)   Resp 16   Ht 5\' 4"  (1.626 m)   Wt 142 lb 6.7 oz  (64.6 kg)   LMP 08/07/2018 (Exact Date)   SpO2 94%   BMI 24.45 kg/m   Physical Exam  Constitutional: She is oriented to person, place, and time. She appears well-developed and well-nourished. No distress.  Pulmonary/Chest: Effort normal. No respiratory distress.  Abdominal:  Both intraabdominal drains with mild tenderness, no erythema or drainage; approximately 25 mL cloudy, foul smelling fluid in JP drains bilaterally; both drains flush/aspirate without resistance.  Neurological: She is alert and oriented to person, place, and time.  Skin: Skin is warm and dry.  Psychiatric: She has a normal mood and affect. Her behavior is normal. Judgment and thought content normal.  Nursing note and vitals reviewed.   Imaging: Ct Abdomen Pelvis W Contrast  Result Date: 08/09/2018 CLINICAL DATA:  Generalized abdominal pain for 1 week with diarrhea EXAM: CT ABDOMEN AND PELVIS WITH CONTRAST TECHNIQUE: Multidetector CT imaging of the abdomen and pelvis was performed using the standard protocol following bolus administration of intravenous contrast. CONTRAST:  100mL OMNIPAQUE IOHEXOL 300 MG/ML  SOLN COMPARISON:  Ultrasound 08/11/2016 FINDINGS: Lower chest: Lung bases demonstrate no acute consolidation or pleural effusion. The heart size is normal Hepatobiliary: No focal liver abnormality is seen. No gallstones, gallbladder wall thickening, or biliary dilatation. Possible sludge in the gallbladder Pancreas: Unremarkable. No pancreatic ductal dilatation or surrounding inflammatory changes. Spleen: Normal in size without focal abnormality. Adrenals/Urinary Tract: Adrenal glands are normal. Cyst in the mid to upper left kidney. No hydronephrosis. Bladder is unremarkable.  Stomach/Bowel: The stomach is nonenlarged. No dilated small bowel. Diffuse colon wall thickening suggesting mild colitis. Appendix is nonenlarged, however on coronal views, there appear to be small distal stones within expected location of the tip of  the appendix with adjacent gas collection, series 6, image number 47. This is contiguous with a slightly larger fluid collection in the upper pelvis measuring 2.7 cm. Vascular/Lymphatic: Nonaneurysmal aorta. Mild retroperitoneal adenopathy. Reproductive: Uterus unremarkable. Loculated fluid collection in the left adnexal region. Other: No free air. Multiple rim enhancing fluid collections within the abdomen and pelvis. Posterior pelvic fluid collection measures 9.8 x 4.1 cm. Anterior pelvic fluid collection measures 3.6 x 5.5 cm and is contiguous with fluid that extends into the left adnexal region. Multiple additional fluid collections are noted. Diffuse edema and soft tissue stranding within the abdominal and pelvic mesentery. Musculoskeletal: No acute or significant osseous findings. IMPRESSION: 1. Multiple rim enhancing fluid collections within the lower abdomen and pelvis as detailed above, concerning for intra-abdominal and intrapelvic abscess. Potential source is perforated appendix as there appears to be stone in the distal appendix with adjacent gas and fluid collection. Other consideration for pelvic abscess would include PID. 2. Diffuse inflammatory changes within the pelvic and abdominal mesentery. 3. There is diffuse colon wall thickening which may be secondary to a colitis. Critical Value/emergent results were called by telephone at the time of interpretation on 08/09/2018 at 8:26 pm to Dr. Meridee Score , who verbally acknowledged these results. Electronically Signed   By: Jasmine Pang M.D.   On: 08/09/2018 20:26    Labs:  CBC: Recent Labs    05/15/18 0954 08/03/18 1418 08/09/18 1024 08/10/18 0401 08/11/18 0347  WBC 4.3 6.4 15.5* 15.7* 10.5  HGB 12.2 13.1 10.5* 9.7* 9.0*  HCT 36.8 38.4 32.3* 30.1* 27.3*  PLT 238  --  375 409* 360    COAGS: Recent Labs    08/10/18 0639  INR 1.11    BMP: Recent Labs    05/15/18 0954 08/09/18 1024 08/10/18 0401 08/11/18 0347  NA 138 136  137 135  K 5.0 3.6 3.8 3.9  CL 102 101 104 103  CO2 25 24 25 25   GLUCOSE 92 107* 110* 109*  BUN 10 12 7 8   CALCIUM 9.3 8.6* 8.1* 8.1*  CREATININE 0.64 0.68 0.71 0.60  GFRNONAA 107 >60 >60 >60  GFRAA 124 >60 >60 >60    LIVER FUNCTION TESTS: Recent Labs    05/15/18 0954 08/09/18 1024 08/10/18 0401 08/11/18 0347  BILITOT 0.4 0.4 0.7 0.8  AST 20 32 17 12*  ALT 17 62* 43 26  ALKPHOS 51 140* 124 93  PROT 7.2 6.3* 5.5* 5.3*  ALBUMIN 4.1 2.5* 2.1* 1.9*    Assessment and Plan:  Intraabdominal abscesses s/p drain placement x2 08/09/2018 with Dr. Loreta Ave. Both intraabdominal drains stable. Appreciate and agree with [facility] management. Continue with Qshift flushes and monitoring of output. IR to follow.   Electronically Signed: Elwin Mocha, PA-C 08/11/2018, 12:18 PM   I spent a total of 15 Minutes at the the patient's bedside AND on the patient's hospital floor or unit, greater than 50% of which was counseling/coordinating care for intraabdominal abscesses s/p drain placement x2.

## 2018-08-11 NOTE — Progress Notes (Signed)
Subjective/Chief Complaint: Feels better   Objective: Vital signs in last 24 hours: Temp:  [98.2 F (36.8 C)-103.1 F (39.5 C)] 99 F (37.2 C) (09/14 0525) Pulse Rate:  [81-91] 81 (09/14 0525) Resp:  [14-27] 16 (09/14 0525) BP: (92-115)/(56-81) 100/62 (09/14 0525) SpO2:  [94 %-100 %] 94 % (09/14 0525) Last BM Date: 08/09/18  Intake/Output from previous day: 09/13 0701 - 09/14 0700 In: 778.1 [I.V.:578.1; IV Piggyback:200] Out: 850 [Urine:600; Drains:250] Intake/Output this shift: No intake/output data recorded.  Exam: Looks comfortable Abdomen soft, minimally tender Drains purulent  Lab Results:  Recent Labs    08/10/18 0401 08/11/18 0347  WBC 15.7* 10.5  HGB 9.7* 9.0*  HCT 30.1* 27.3*  PLT 409* 360   BMET Recent Labs    08/10/18 0401 08/11/18 0347  NA 137 135  K 3.8 3.9  CL 104 103  CO2 25 25  GLUCOSE 110* 109*  BUN 7 8  CREATININE 0.71 0.60  CALCIUM 8.1* 8.1*   PT/INR Recent Labs    08/10/18 0639  LABPROT 14.2  INR 1.11   ABG No results for input(s): PHART, HCO3 in the last 72 hours.  Invalid input(s): PCO2, PO2  Studies/Results: Ct Abdomen Pelvis W Contrast  Result Date: 08/09/2018 CLINICAL DATA:  Generalized abdominal pain for 1 week with diarrhea EXAM: CT ABDOMEN AND PELVIS WITH CONTRAST TECHNIQUE: Multidetector CT imaging of the abdomen and pelvis was performed using the standard protocol following bolus administration of intravenous contrast. CONTRAST:  100mL OMNIPAQUE IOHEXOL 300 MG/ML  SOLN COMPARISON:  Ultrasound 08/11/2016 FINDINGS: Lower chest: Lung bases demonstrate no acute consolidation or pleural effusion. The heart size is normal Hepatobiliary: No focal liver abnormality is seen. No gallstones, gallbladder wall thickening, or biliary dilatation. Possible sludge in the gallbladder Pancreas: Unremarkable. No pancreatic ductal dilatation or surrounding inflammatory changes. Spleen: Normal in size without focal abnormality.  Adrenals/Urinary Tract: Adrenal glands are normal. Cyst in the mid to upper left kidney. No hydronephrosis. Bladder is unremarkable. Stomach/Bowel: The stomach is nonenlarged. No dilated small bowel. Diffuse colon wall thickening suggesting mild colitis. Appendix is nonenlarged, however on coronal views, there appear to be small distal stones within expected location of the tip of the appendix with adjacent gas collection, series 6, image number 47. This is contiguous with a slightly larger fluid collection in the upper pelvis measuring 2.7 cm. Vascular/Lymphatic: Nonaneurysmal aorta. Mild retroperitoneal adenopathy. Reproductive: Uterus unremarkable. Loculated fluid collection in the left adnexal region. Other: No free air. Multiple rim enhancing fluid collections within the abdomen and pelvis. Posterior pelvic fluid collection measures 9.8 x 4.1 cm. Anterior pelvic fluid collection measures 3.6 x 5.5 cm and is contiguous with fluid that extends into the left adnexal region. Multiple additional fluid collections are noted. Diffuse edema and soft tissue stranding within the abdominal and pelvic mesentery. Musculoskeletal: No acute or significant osseous findings. IMPRESSION: 1. Multiple rim enhancing fluid collections within the lower abdomen and pelvis as detailed above, concerning for intra-abdominal and intrapelvic abscess. Potential source is perforated appendix as there appears to be stone in the distal appendix with adjacent gas and fluid collection. Other consideration for pelvic abscess would include PID. 2. Diffuse inflammatory changes within the pelvic and abdominal mesentery. 3. There is diffuse colon wall thickening which may be secondary to a colitis. Critical Value/emergent results were called by telephone at the time of interpretation on 08/09/2018 at 8:26 pm to Dr. Meridee ScoreMICHAEL BUTLER , who verbally acknowledged these results. Electronically Signed   By: Selena BattenKim  Jake Samples M.D.   On: 08/09/2018 20:26     Anti-infectives: Anti-infectives (From admission, onward)   Start     Dose/Rate Route Frequency Ordered Stop   08/10/18 1000  cefTRIAXone (ROCEPHIN) 2 g in sodium chloride 0.9 % 100 mL IVPB     2 g 200 mL/hr over 30 Minutes Intravenous Every 24 hours 08/09/18 2230     08/10/18 0600  metroNIDAZOLE (FLAGYL) IVPB 500 mg     500 mg 100 mL/hr over 60 Minutes Intravenous Every 8 hours 08/09/18 2230     08/09/18 2030  cefTRIAXone (ROCEPHIN) 1 g in sodium chloride 0.9 % 100 mL IVPB     1 g 200 mL/hr over 30 Minutes Intravenous  Once 08/09/18 2024 08/09/18 2119   08/09/18 2030  metroNIDAZOLE (FLAGYL) IVPB 500 mg     500 mg 100 mL/hr over 60 Minutes Intravenous  Once 08/09/18 2024 08/09/18 2236      Assessment/Plan: Perforated appendicitis with multiple abscesses s/p IR placed drains  Appreciate IR's assistance WBC already down to normal Continue IV antibiotics Start clear liquids  Arlyne Brandes A 08/11/2018

## 2018-08-12 ENCOUNTER — Encounter (HOSPITAL_COMMUNITY): Payer: Self-pay

## 2018-08-12 LAB — CBC
HCT: 28 % — ABNORMAL LOW (ref 36.0–46.0)
Hemoglobin: 9.1 g/dL — ABNORMAL LOW (ref 12.0–15.0)
MCH: 28.4 pg (ref 26.0–34.0)
MCHC: 32.5 g/dL (ref 30.0–36.0)
MCV: 87.5 fL (ref 78.0–100.0)
Platelets: 399 10*3/uL (ref 150–400)
RBC: 3.2 MIL/uL — AB (ref 3.87–5.11)
RDW: 13.4 % (ref 11.5–15.5)
WBC: 9.8 10*3/uL (ref 4.0–10.5)

## 2018-08-12 LAB — COMPREHENSIVE METABOLIC PANEL
ALK PHOS: 92 U/L (ref 38–126)
ALT: 22 U/L (ref 0–44)
AST: 10 U/L — AB (ref 15–41)
Albumin: 2 g/dL — ABNORMAL LOW (ref 3.5–5.0)
Anion gap: 7 (ref 5–15)
CALCIUM: 8.1 mg/dL — AB (ref 8.9–10.3)
CHLORIDE: 103 mmol/L (ref 98–111)
CO2: 26 mmol/L (ref 22–32)
CREATININE: 0.65 mg/dL (ref 0.44–1.00)
GFR calc Af Amer: >60 mL/min (ref >60)
Glucose, Bld: 111 mg/dL — ABNORMAL HIGH (ref 70–99)
Potassium: 3.9 mmol/L (ref 3.5–5.1)
Sodium: 136 mmol/L (ref 135–145)
Total Bilirubin: 0.5 mg/dL (ref 0.3–1.2)
Total Protein: 5.3 g/dL — ABNORMAL LOW (ref 6.5–8.1)

## 2018-08-12 MED ORDER — SODIUM CHLORIDE 0.9 % IV SOLN
1.0000 g | Freq: Two times a day (BID) | INTRAVENOUS | Status: DC
  Administered 2018-08-12 – 2018-08-19 (×14): 1 g via INTRAVENOUS
  Filled 2018-08-12 (×14): qty 1

## 2018-08-12 NOTE — Progress Notes (Signed)
   Subjective/Chief Complaint: Reports pain less Tolerating clears   Objective: Vital signs in last 24 hours: Temp:  [98.2 F (36.8 C)-100.4 F (38 C)] 98.8 F (37.1 C) (09/15 0432) Pulse Rate:  [72-80] 72 (09/15 0432) Resp:  [15-20] 15 (09/15 0432) BP: (95-106)/(60-64) 95/60 (09/15 0432) SpO2:  [96 %-100 %] 100 % (09/15 0432) Last BM Date: 08/09/18  Intake/Output from previous day: 09/14 0701 - 09/15 0700 In: 1968.4 [P.O.:520; I.V.:900.7; IV Piggyback:527.7] Out: 1930 [Urine:1850; Drains:80] Intake/Output this shift: No intake/output data recorded.  Exam: Looks more comfortable Abdomen soft, drains purulent, abd non-distended  Lab Results:  Recent Labs    08/11/18 0347 08/12/18 0351  WBC 10.5 9.8  HGB 9.0* 9.1*  HCT 27.3* 28.0*  PLT 360 399   BMET Recent Labs    08/11/18 0347 08/12/18 0351  NA 135 136  K 3.9 3.9  CL 103 103  CO2 25 26  GLUCOSE 109* 111*  BUN 8 <5*  CREATININE 0.60 0.65  CALCIUM 8.1* 8.1*   PT/INR Recent Labs    08/10/18 0639  LABPROT 14.2  INR 1.11   ABG No results for input(s): PHART, HCO3 in the last 72 hours.  Invalid input(s): PCO2, PO2  Studies/Results: No results found.  Anti-infectives: Anti-infectives (From admission, onward)   Start     Dose/Rate Route Frequency Ordered Stop   08/10/18 1000  cefTRIAXone (ROCEPHIN) 2 g in sodium chloride 0.9 % 100 mL IVPB     2 g 200 mL/hr over 30 Minutes Intravenous Every 24 hours 08/09/18 2230     08/10/18 0600  metroNIDAZOLE (FLAGYL) IVPB 500 mg     500 mg 100 mL/hr over 60 Minutes Intravenous Every 8 hours 08/09/18 2230     08/09/18 2030  cefTRIAXone (ROCEPHIN) 1 g in sodium chloride 0.9 % 100 mL IVPB     1 g 200 mL/hr over 30 Minutes Intravenous  Once 08/09/18 2024 08/09/18 2119   08/09/18 2030  metroNIDAZOLE (FLAGYL) IVPB 500 mg     500 mg 100 mL/hr over 60 Minutes Intravenous  Once 08/09/18 2024 08/09/18 2236      Assessment/Plan:  Perforated appendicitis s/p IR  placed drains  Continue IV antibiotics Try full liquids  LOS: 3 days    Ellanore Vanhook A 08/12/2018

## 2018-08-13 LAB — URINALYSIS, ROUTINE W REFLEX MICROSCOPIC
Bilirubin Urine: NEGATIVE
GLUCOSE, UA: NEGATIVE mg/dL
Ketones, ur: NEGATIVE mg/dL
LEUKOCYTES UA: NEGATIVE
Nitrite: NEGATIVE
PH: 8 (ref 5.0–8.0)
Protein, ur: NEGATIVE mg/dL
SPECIFIC GRAVITY, URINE: 1.009 (ref 1.005–1.030)

## 2018-08-13 LAB — AEROBIC/ANAEROBIC CULTURE (SURGICAL/DEEP WOUND)

## 2018-08-13 LAB — COMPREHENSIVE METABOLIC PANEL
ALBUMIN: 2.2 g/dL — AB (ref 3.5–5.0)
ALK PHOS: 91 U/L (ref 38–126)
ALT: 18 U/L (ref 0–44)
AST: 13 U/L — AB (ref 15–41)
Anion gap: 7 (ref 5–15)
BILIRUBIN TOTAL: 0.3 mg/dL (ref 0.3–1.2)
CALCIUM: 8.7 mg/dL — AB (ref 8.9–10.3)
CO2: 28 mmol/L (ref 22–32)
Chloride: 102 mmol/L (ref 98–111)
Creatinine, Ser: 0.64 mg/dL (ref 0.44–1.00)
GFR calc Af Amer: >60 mL/min (ref >60)
GFR calc non Af Amer: >60 mL/min (ref >60)
GLUCOSE: 116 mg/dL — AB (ref 70–99)
Potassium: 4.7 mmol/L (ref 3.5–5.1)
SODIUM: 137 mmol/L (ref 135–145)
TOTAL PROTEIN: 6.1 g/dL — AB (ref 6.5–8.1)

## 2018-08-13 LAB — AEROBIC/ANAEROBIC CULTURE W GRAM STAIN (SURGICAL/DEEP WOUND)

## 2018-08-13 LAB — CBC
HEMATOCRIT: 32.5 % — AB (ref 36.0–46.0)
Hemoglobin: 10.5 g/dL — ABNORMAL LOW (ref 12.0–15.0)
MCH: 28.3 pg (ref 26.0–34.0)
MCHC: 32.3 g/dL (ref 30.0–36.0)
MCV: 87.6 fL (ref 78.0–100.0)
Platelets: 505 10*3/uL — ABNORMAL HIGH (ref 150–400)
RBC: 3.71 MIL/uL — ABNORMAL LOW (ref 3.87–5.11)
RDW: 13.4 % (ref 11.5–15.5)
WBC: 8.2 10*3/uL (ref 4.0–10.5)

## 2018-08-13 MED ORDER — ENSURE ENLIVE PO LIQD
237.0000 mL | Freq: Two times a day (BID) | ORAL | Status: DC
  Administered 2018-08-13 – 2018-08-21 (×12): 237 mL via ORAL

## 2018-08-13 NOTE — Progress Notes (Addendum)
Nutrition Follow-up  DOCUMENTATION CODES:   Not applicable  INTERVENTION:    Ensure Enlive po BID, each supplement provides 350 kcal and 20 grams of protein  NUTRITION DIAGNOSIS:   Increased nutrient needs related to acute illness(perforated appendicitis with multiple abscesses) as evidenced by estimated needs   Ongoing  GOAL:   Patient will meet greater than or equal to 90% of their needs   Unmet  MONITOR:   Diet advancement, PO intake, Supplement acceptance, Labs, Skin, Weight trends, I & O's  ASSESSMENT:   47 y.o. Female to ED with complaint of 1 week of crampy low abdominal pain and multiple episodes of diarrhea.   Pt admitted with Acute appendicitis with perforation and peritoneal abscess [K35.33].  Unable to speak with patient. Ancillary staff in room during RD visit.  Patient now on full liquids; tolerating okay, but intake is poor. Will add PO supplements.  Labs and medications reviewed.   NUTRITION - FOCUSED PHYSICAL EXAM:  Unable to complete at this time.  Diet Order:   Diet Order            Diet full liquid Room service appropriate? Yes; Fluid consistency: Thin  Diet effective now             EDUCATION NEEDS:   Not appropriate for education at this time  Skin:  Skin Assessment: Reviewed RN Assessment  Last BM:  9/12   Height:   Ht Readings from Last 1 Encounters:  08/10/18 5\' 4"  (1.626 m)   Weight:   Wt Readings from Last 1 Encounters:  08/10/18 64.6 kg   BMI:  Body mass index is 24.45 kg/m.  Estimated Nutritional Needs:   Kcal:  1700-1900  Protein:  80-95 gm  Fluid:  1.7-1.9 L   Joaquin CourtsKimberly Lether Tesch, RD, LDN, CNSC Pager [phone] After Hours Pager [phone]

## 2018-08-13 NOTE — Progress Notes (Signed)
Referring Physician(s):  Kinsinger, De Blanch  Supervising Physician: Richarda Overlie  Patient Status:  [facility] - In-pt  Chief Complaint:  Perforated appendix  Subjective: Still with tenderness around insertion sites.  Slowly improving.   Allergies: Patient has no known allergies.  Medications: Prior to Admission medications   Medication Sig Start Date End Date Taking? Authorizing Provider  acetaminophen (TYLENOL) 325 MG tablet Take 650 mg by mouth every 6 (six) hours as needed (for pain or fever).    Yes [provider]  diphenoxylate-atropine (LOMOTIL) 2.5-0.025 MG tablet Take 2 tablets by mouth 3 (three) times daily as needed for diarrhea or loose stools.   Yes [provider]  doxycycline (VIBRAMYCIN) 100 MG capsule Take 100 mg by mouth 2 (two) times daily. FOR 10 DAYS 08/06/18 08/15/18 Yes [provider]  fluticasone (FLONASE) 50 MCG/ACT nasal spray Place 1 spray into both nostrils 2 (two) times daily. Patient not taking: Reported on 08/09/2018 05/15/18   Myles Lipps, MD  meloxicam (MOBIC) 7.5 MG tablet Take 1-2 tablets (7.5-15 mg total) by mouth daily. Patient not taking: Reported on 08/09/2018 06/20/18   Benjiman Core D, PA-C  ondansetron (ZOFRAN) 4 MG tablet Take 4 mg by mouth every 8 (eight) hours as needed for nausea or vomiting.    [provider]  predniSONE (DELTASONE) 20 MG tablet Take 3 PO QAM x2days, 2 PO QAM x2days, 1 PO QAM x2days 07/26/18   McVey, Madelaine Bhat, PA-C  ranitidine (ZANTAC) 150 MG tablet Take 1 tablet (150 mg total) by mouth 2 (two) times daily. Patient not taking: Reported on 08/09/2018 07/26/18   McVey, Madelaine Bhat, PA-C     Vital Signs: BP (!) 91/52 (BP Location: Right Arm)   Pulse 72   Temp 98.5 F (36.9 C) (Oral)   Resp 18   Ht 5\' 4"  (1.626 m)   Wt 142 lb 6.7 oz (64.6 kg)   LMP 08/07/2018 (Exact Date)   SpO2 98%   BMI 24.45 kg/m   Physical Exam  Nursing note and vitals reviewed. NAD,  alert Abdomen: soft, mildly tender across lower abdomen.  Anterior drain in place with cloudy output- 15 mL recorded yesterday.  Poserior/TG drain in place with increased purulent output. Both insertion sites are c/d/i.    Imaging: Ct Abdomen Pelvis W Contrast  Result Date: 08/09/2018 CLINICAL DATA:  Generalized abdominal pain for 1 week with diarrhea EXAM: CT ABDOMEN AND PELVIS WITH CONTRAST TECHNIQUE: Multidetector CT imaging of the abdomen and pelvis was performed using the standard protocol following bolus administration of intravenous contrast. CONTRAST:  OMNIPAQUE IOHEXOL 300 MG/ML  SOLN COMPARISON:  Ultrasound 08/11/2016 FINDINGS: Lower chest: Lung bases demonstrate no acute consolidation or pleural effusion. The heart size is normal Hepatobiliary: No focal liver abnormality is seen. No gallstones, gallbladder wall thickening, or biliary dilatation. Possible sludge in the gallbladder Pancreas: Unremarkable. No pancreatic ductal dilatation or surrounding inflammatory changes. Spleen: Normal in size without focal abnormality. Adrenals/Urinary Tract: Adrenal glands are normal. Cyst in the mid to upper left kidney. No hydronephrosis. Bladder is unremarkable. Stomach/Bowel: The stomach is nonenlarged. No dilated small bowel. Diffuse colon wall thickening suggesting mild colitis. Appendix is nonenlarged, however on coronal views, there appear to be small distal stones within expected location of the tip of the appendix with adjacent gas collection, series 6, image number 47. This is contiguous with a slightly larger fluid collection in the upper pelvis measuring 2.7 cm. Vascular/Lymphatic: Nonaneurysmal aorta. Mild retroperitoneal adenopathy. Reproductive: Uterus  unremarkable. Loculated fluid collection in the left adnexal region. Other: No free air. Multiple rim enhancing fluid collections within the abdomen and pelvis. Posterior pelvic fluid collection measures 9.8 x 4.1 cm. Anterior pelvic fluid  collection measures 3.6 x 5.5 cm and is contiguous with fluid that extends into the left adnexal region. Multiple additional fluid collections are noted. Diffuse edema and soft tissue stranding within the abdominal and pelvic mesentery. Musculoskeletal: No acute or significant osseous findings. IMPRESSION: 1. Multiple rim enhancing fluid collections within the lower abdomen and pelvis as detailed above, concerning for intra-abdominal and intrapelvic abscess. Potential source is perforated appendix as there appears to be stone in the distal appendix with adjacent gas and fluid collection. Other consideration for pelvic abscess would include PID. 2. Diffuse inflammatory changes within the pelvic and abdominal mesentery. 3. There is diffuse colon wall thickening which may be secondary to a colitis. Critical Value/emergent results were called by telephone at the time of interpretation on 08/09/2018 at 8:26 pm to Dr. Meridee ScoreMICHAEL BUTLER , who verbally acknowledged these results. Electronically Signed   By: Jasmine PangKim  Fujinaga M.D.   On: 08/09/2018 20:26   Ct Image Guided Drainage By Percutaneous Catheter  Result Date: 08/13/2018 CLINICAL DATA:  47 year old female with a history of abscess EXAM: CT GUIDED DRAINAGE OF BODY PART ABSCESS ANESTHESIA/SEDATION: 2.0 mg IV Versed 100 mcg IV Fentanyl Total Moderate Sedation Time:  45 minutes PROCEDURE: The procedure, risks, benefits, and alternatives were explained to the patient. Questions regarding the procedure were encouraged and answered. The patient understands and consents to the procedure. The patient was brought to the CT scanner in position right decubitus. Scout images were acquired. The posterior gluteal region was prepped with chlorhexidinein a sterile fashion, and a sterile drape was applied covering the operative field. A sterile gown and sterile gloves were used for the procedure. Local anesthesia was provided with 1% Lidocaine. After generous infiltration with 1%  lidocaine, modified Seldinger technique was used to place a 10 [place] drain in a left trans gluteal approach into the abscess within the pelvis. Drainage catheter was attached to bulb suction and sutured in position. Using similar targeting, anterior approach was selected for anterior fluid collection. Using modified Seldinger technique, a 10 French drain was placed into anterior fluid collection. The second drain was sutured in position and attached to bulb suction. Final CT was performed confirming location of the drains Patient tolerated the procedure well and remained hemodynamically stable throughout. No complications were encountered and no significant blood loss. COMPLICATIONS: None FINDINGS: CT in right decubitus position demonstrates complex fluid collection of the abdomen and the posterior pelvis. After placement of anterior approach drain and a left trans gluteal approach drain, there has been at least partial decompression of the fluid collection. Samples were sent from both drains for culture. IMPRESSION: Status post CT-guided drainage of anterior abdominal fluid collection and a pelvic fluid collection with 10 French drains. Cultures were sent. Signed, Yvone NeuJaime S. Reyne DumasWagner, DO, RPVI [facility] Electronically Signed   By: Gilmer MorJaime  Wagner D.O.   On: 08/13/2018 08:10   Ct Image Guided Drainage Percut Cath  Peritoneal Retroperit  Result Date: 08/13/2018 CLINICAL DATA:  47 year old female with a history of abscess EXAM: CT GUIDED DRAINAGE OF BODY PART ABSCESS ANESTHESIA/SEDATION: 2.0 mg IV Versed 100 mcg IV Fentanyl Total Moderate Sedation Time:  45 minutes PROCEDURE: The procedure, risks, benefits, and alternatives were explained to the patient. Questions regarding the procedure were encouraged and answered.  The patient understands and consents to the procedure. The patient was brought to the CT scanner in position right decubitus. Scout images were  acquired. The posterior gluteal region was prepped with chlorhexidinein a sterile fashion, and a sterile drape was applied covering the operative field. A sterile gown and sterile gloves were used for the procedure. Local anesthesia was provided with 1% Lidocaine. After generous infiltration with 1% lidocaine, modified Seldinger technique was used to place a 10 [place] drain in a left trans gluteal approach into the abscess within the pelvis. Drainage catheter was attached to bulb suction and sutured in position. Using similar targeting, anterior approach was selected for anterior fluid collection. Using modified Seldinger technique, a 10 French drain was placed into anterior fluid collection. The second drain was sutured in position and attached to bulb suction. Final CT was performed confirming location of the drains Patient tolerated the procedure well and remained hemodynamically stable throughout. No complications were encountered and no significant blood loss. COMPLICATIONS: None FINDINGS: CT in right decubitus position demonstrates complex fluid collection of the abdomen and the posterior pelvis. After placement of anterior approach drain and a left trans gluteal approach drain, there has been at least partial decompression of the fluid collection. Samples were sent from both drains for culture. IMPRESSION: Status post CT-guided drainage of anterior abdominal fluid collection and a pelvic fluid collection with 10 French drains. Cultures were sent. Signed, Yvone Neu. Reyne Dumas, RPVI [facility] Electronically Signed   By: Gilmer Mor D.O.   On: 08/13/2018 08:10    Labs:  CBC: Recent Labs    08/10/18 0401 08/11/18 0347 08/12/18 0351 08/13/18 0413  WBC 15.7* 10.5 9.8 8.2  HGB 9.7* 9.0* 9.1* 10.5*  HCT 30.1* 27.3* 28.0* 32.5*  PLT 409* 360 399 505*    COAGS: Recent Labs    08/10/18 0639  INR 1.11    BMP: Recent Labs     08/10/18 0401 08/11/18 0347 08/12/18 0351 08/13/18 0413  NA 137 135 136 137  K 3.8 3.9 3.9 4.7  CL 104 103 103 102  CO2 25 25 26 28   GLUCOSE 110* 109* 111* 116*  BUN 7 8 <5* <5*  CALCIUM 8.1* 8.1* 8.1* 8.7*  CREATININE 0.71 0.60 0.65 0.64  GFRNONAA >60 >60 >60 >60  GFRAA >60 >60 >60 >60    LIVER FUNCTION TESTS: Recent Labs    08/10/18 0401 08/11/18 0347 08/12/18 0351 08/13/18 0413  BILITOT 0.7 0.8 0.5 0.3  AST 17 12* 10* 13*  ALT 43 26 22 18   ALKPHOS 124 93 92 91  PROT 5.5* 5.3* 5.3* 6.1*  ALBUMIN 2.1* 1.9* 2.0* 2.2*    Assessment and Plan: Intraabdominal abscesses s/p drain placement x2 08/09/2018 with Dr. Loreta Ave. Both intraabdominal drains stable with continued output.  Still with abdominal tenderness but may be related to drain position.  Also note work-up for possible UTI.  Continue current management.   Electronically Signed: Hoyt Koch, PA 08/13/2018, 11:52 AM   I spent a total of 15 Minutes at the the patient's bedside AND on the patient's hospital floor or unit, greater than 50% of which was counseling/coordinating care for intraabdominal abscesses s/p drain placement x2.

## 2018-08-13 NOTE — Plan of Care (Signed)
  Problem: Education: Goal: Knowledge of General Education information will improve Description: Including pain rating scale, medication(s)/side effects and non-pharmacologic comfort measures Outcome: Progressing   Problem: Clinical Measurements: Goal: Ability to maintain clinical measurements within normal limits will improve Outcome: Progressing Goal: Will remain free from infection Outcome: Progressing Goal: Cardiovascular complication will be avoided Outcome: Progressing   Problem: Activity: Goal: Risk for activity intolerance will decrease Outcome: Progressing   Problem: Elimination: Goal: Will not experience complications related to urinary retention Outcome: Progressing   Problem: Pain Managment: Goal: General experience of comfort will improve Outcome: Progressing   Problem: Safety: Goal: Ability to remain free from injury will improve Outcome: Progressing   

## 2018-08-13 NOTE — Progress Notes (Signed)
Emptied 50cc from jp drains

## 2018-08-13 NOTE — Progress Notes (Addendum)
CC: Perforated appendix  Subjective: Patient still having pain on her right side especially around her hip.  Also having pain when she voids.  She is tolerating clears.  She has 2 drains.  The anterior drain has a milky colored fluid in it.  The posterior transgluteal drain is purulent.  She has a few bowel sounds, occasional flatus no BM so far.  You specific interpreters, discussion for 35 minutes.  Objective: Vital signs in last 24 hours: Temp:  [98.5 F (36.9 C)-99.7 F (37.6 C)] 98.5 F (36.9 C) (09/16 0437) Pulse Rate:  [72-79] 72 (09/16 0437) Resp:  [18] 18 (09/16 0437) BP: (91-106)/(52-76) 91/52 (09/16 0437) SpO2:  [98 %-100 %] 98 % (09/16 0437) Last BM Date: 08/12/18 720 PO 2800 IV Urine x 7 Drain 25 cc recorded. Afebrile, yesterday CMP OK,  WBC 8.2 CT with drain placement 9/13 She is growing some E. coli from her transgluteal drain -she is on enteric precautions for this. Sensitive to gentamicin, imipenem and Zosyn. Intake/Output from previous day: 09/15 0701 - 09/16 0700 In: 4041.9 [P.O.:720; I.V.:1591.9; IV Piggyback:1720] Out: 25 [Drains:25] Intake/Output this shift: No intake/output data recorded.  General appearance: alert, cooperative and no distress Resp: clear to auscultation bilaterally GI: Soft, still tender on the right side, purulent drainage from the transgluteal drain, and milky drainage from the anterior drain.  Lab Results:  Recent Labs    08/12/18 0351 08/13/18 0413  WBC 9.8 8.2  HGB 9.1* 10.5*  HCT 28.0* 32.5*  PLT 399 505*    BMET Recent Labs    08/12/18 0351 08/13/18 0413  NA 136 137  K 3.9 4.7  CL 103 102  CO2 26 28  GLUCOSE 111* 116*  BUN <5* <5*  CREATININE 0.65 0.64  CALCIUM 8.1* 8.7*   PT/INR No results for input(s): LABPROT, INR in the last 72 hours.  Recent Labs  Lab 08/09/18 1024 08/10/18 0401 08/11/18 0347 08/12/18 0351 08/13/18 0413  AST 32 17 12* 10* 13*  ALT 62* 43 26 22 18   ALKPHOS 140* 124 93  92 91  BILITOT 0.4 0.7 0.8 0.5 0.3  PROT 6.3* 5.5* 5.3* 5.3* 6.1*  ALBUMIN 2.5* 2.1* 1.9* 2.0* 2.2*     Lipase     Component Value Date/Time   LIPASE 55 (H) 08/09/2018 1024     Medications: . enoxaparin (LOVENOX) injection  40 mg Subcutaneous Daily  . sodium chloride flush  5 mL Intracatheter Q8H   . dextrose 5 % and 0.45 % NaCl with KCl 20 mEq/L 75 mL/hr (08/12/18 0807)  . meropenem (MERREM) IV 1 g (08/13/18 [id number])   Anti-infectives (From admission, onward)   Start     Dose/Rate Route Frequency Ordered Stop   08/12/18 1800  meropenem (MERREM) 1 g in sodium chloride 0.9 % 100 mL IVPB     1 g 200 mL/hr over 30 Minutes Intravenous Every 12 hours 08/12/18 1637     08/10/18 1000  cefTRIAXone (ROCEPHIN) 2 g in sodium chloride 0.9 % 100 mL IVPB  Status:  Discontinued     2 g 200 mL/hr over 30 Minutes Intravenous Every 24 hours 08/09/18 2230 08/12/18 1637   08/10/18 0600  metroNIDAZOLE (FLAGYL) IVPB 500 mg  Status:  Discontinued     500 mg 100 mL/hr over 60 Minutes Intravenous Every 8 hours 08/09/18 2230 08/12/18 1637   08/09/18 2030  cefTRIAXone (ROCEPHIN) 1 g in sodium chloride 0.9 % 100 mL IVPB     1 g  200 mL/hr over 30 Minutes Intravenous  Once 08/09/18 2024 08/09/18 2119   08/09/18 2030  metroNIDAZOLE (FLAGYL) IVPB 500 mg     500 mg 100 mL/hr over 60 Minutes Intravenous  Once 08/09/18 2024 08/09/18 2236     Assessment/Plan History of C-section 12/09/2014   Perforated appendicitis with multiple abscesses - IR drain placement 08/10/2018  Diarrhea - GI panel negative - E Coli from the drain fluid  ID - rocephin/flagyl 9/12 - 9/125;  Meropenem 9/15 =>> day 2 FEN - IVF/Full liquids VTE - SCDs, lovenox Foley - none  Plan: Continue antibiotics, leave her on full liquids she is tolerating that well.  We will also check her urine. Mobilize, and add IS.      LOS: 4 days    Sierra Hensley 08/13/2018 [phone]

## 2018-08-13 NOTE — Progress Notes (Signed)
Pt complains of more pain on the right side of her abdomen. When palpated she does have some tenderness. Pt also has some bleeding when she wiped after urinating. Pt has had ensure and soup that her family brought her. Pt states her last cycle was 08/30. Will continue to monitor pt.

## 2018-08-14 LAB — CBC
HEMATOCRIT: 34 % — AB (ref 36.0–46.0)
Hemoglobin: 10.7 g/dL — ABNORMAL LOW (ref 12.0–15.0)
MCH: 27.8 pg (ref 26.0–34.0)
MCHC: 31.5 g/dL (ref 30.0–36.0)
MCV: 88.3 fL (ref 78.0–100.0)
Platelets: 573 10*3/uL — ABNORMAL HIGH (ref 150–400)
RBC: 3.85 MIL/uL — ABNORMAL LOW (ref 3.87–5.11)
RDW: 13.6 % (ref 11.5–15.5)
WBC: 7.8 10*3/uL (ref 4.0–10.5)

## 2018-08-14 LAB — COMPREHENSIVE METABOLIC PANEL
ALBUMIN: 2.3 g/dL — AB (ref 3.5–5.0)
ALK PHOS: 80 U/L (ref 38–126)
ALT: 20 U/L (ref 0–44)
AST: 26 U/L (ref 15–41)
Anion gap: 10 (ref 5–15)
BILIRUBIN TOTAL: 0.3 mg/dL (ref 0.3–1.2)
BUN: 6 mg/dL (ref 6–20)
CO2: 28 mmol/L (ref 22–32)
Calcium: 8.6 mg/dL — ABNORMAL LOW (ref 8.9–10.3)
Chloride: 99 mmol/L (ref 98–111)
Creatinine, Ser: 0.71 mg/dL (ref 0.44–1.00)
GFR calc Af Amer: >60 mL/min (ref >60)
GFR calc non Af Amer: >60 mL/min (ref >60)
GLUCOSE: 103 mg/dL — AB (ref 70–99)
POTASSIUM: 4.2 mmol/L (ref 3.5–5.1)
Sodium: 137 mmol/L (ref 135–145)
TOTAL PROTEIN: 5.9 g/dL — AB (ref 6.5–8.1)

## 2018-08-14 LAB — URINE CULTURE: Culture: NO GROWTH

## 2018-08-14 MED ORDER — HYDROCORTISONE 1 % EX CREA
TOPICAL_CREAM | CUTANEOUS | Status: DC | PRN
  Administered 2018-08-15: 04:00:00 via TOPICAL
  Filled 2018-08-14: qty 28

## 2018-08-14 NOTE — Plan of Care (Signed)
  Problem: Coping: Goal: Level of anxiety will decrease Outcome: Progressing   Problem: Pain Managment: Goal: General experience of comfort will improve Outcome: Progressing   Problem: Safety: Goal: Ability to remain free from injury will improve Outcome: Progressing   

## 2018-08-14 NOTE — Progress Notes (Signed)
ZO:[id number] pain  Subjective: She is on the phone with her brother so he translated for [place] today.  She is feeling better, less pain,tolerating full liquids.    Objective: Vital signs in last 24 hours: Temp:  [98.2 F (36.8 C)-99.1 F (37.3 C)] 98.2 F (36.8 C) (09/17 0219) Pulse Rate:  [76-90] 76 (09/17 0219) Resp:  [18] 18 (09/17 0219) BP: (89-100)/(52-63) 93/59 (09/17 0219) SpO2:  [99 %-100 %] 99 % (09/17 0219) Last BM Date: 08/14/18 440 PO Urine 1175 Stool x 2 Drains 10 listed from each drain Afebrile, VSS BP still low end WBC 7.8 CMP - Ok Intake/Output from previous day: 09/16 0701 - 09/17 0700 In: 455 [P.O.:440] Out: 1195 [Urine:1175; Drains:20] Intake/Output this shift: No intake/output data recorded.  General appearance: alert, cooperative and no distress Resp: clear to auscultation bilaterally GI: less pain, pain is still in the lower abdomen and is more on the left than the right.   Both drains have same color of fluid today, clearer in the anterior drain than the transgluteal drain.  10 cc recorded from each for the day.     Lab Results:  Recent Labs    08/13/18 0413 08/14/18 0530  WBC 8.2 7.8  HGB 10.5* 10.7*  HCT 32.5* 34.0*  PLT 505* 573*    BMET Recent Labs    08/13/18 0413 08/14/18 0530  NA 137 137  K 4.7 4.2  CL 102 99  CO2 28 28  GLUCOSE 116* 103*  BUN <5* 6  CREATININE 0.64 0.71  CALCIUM 8.7* 8.6*   PT/INR No results for input(s): LABPROT, INR in the last 72 hours.  Recent Labs  Lab 08/10/18 0401 08/11/18 0347 08/12/18 0351 08/13/18 0413 08/14/18 0530  AST 17 12* 10* 13* 26  ALT 43 26 22 18 20   ALKPHOS 124 93 92 91 80  BILITOT 0.7 0.8 0.5 0.3 0.3  PROT 5.5* 5.3* 5.3* 6.1* 5.9*  ALBUMIN 2.1* 1.9* 2.0* 2.2* 2.3*     Lipase     Component Value Date/Time   LIPASE 55 (H) 08/09/2018 1024     Medications: . enoxaparin (LOVENOX) injection  40 mg Subcutaneous Daily  . feeding supplement (ENSURE ENLIVE)  237 mL  Oral BID BM  . sodium chloride flush  5 mL Intracatheter Q8H   . dextrose 5 % and 0.45 % NaCl with KCl 20 mEq/L 75 mL/hr at 08/13/18 1453  . meropenem (MERREM) IV 1 g (08/14/18 0512)   Anti-infectives (From admission, onward)   Start     Dose/Rate Route Frequency Ordered Stop   08/12/18 1800  meropenem (MERREM) 1 g in sodium chloride 0.9 % 100 mL IVPB     1 g 200 mL/hr over 30 Minutes Intravenous Every 12 hours 08/12/18 1637     08/10/18 1000  cefTRIAXone (ROCEPHIN) 2 g in sodium chloride 0.9 % 100 mL IVPB  Status:  Discontinued     2 g 200 mL/hr over 30 Minutes Intravenous Every 24 hours 08/09/18 2230 08/12/18 1637   08/10/18 0600  metroNIDAZOLE (FLAGYL) IVPB 500 mg  Status:  Discontinued     500 mg 100 mL/hr over 60 Minutes Intravenous Every 8 hours 08/09/18 2230 08/12/18 1637   08/09/18 2030  cefTRIAXone (ROCEPHIN) 1 g in sodium chloride 0.9 % 100 mL IVPB     1 g 200 mL/hr over 30 Minutes Intravenous  Once 08/09/18 2024 08/09/18 2119   08/09/18 2030  metroNIDAZOLE (FLAGYL) IVPB 500 mg  500 mg 100 mL/hr over 60 Minutes Intravenous  Once 08/09/18 2024 08/09/18 2236      Assessment/Plan  History of C-section 12/09/2014   Perforated appendicitis with multiple abscesses - IR drain placement 08/10/2018 Diarrhea- GI panel negative - E Coli from the drain fluid  ID -rocephin/flagyl 9/12 - 9/125;  Meropenem 9/15 =>> day 3 FEN -IVF/Full liquids VTE -SCDs, lovenox Foley -none  Plan:  Continue antibiotics. Full liquids and CT tomorrow.    LOS: 5 days    Denard Tuminello 08/14/2018 [phone]

## 2018-08-14 NOTE — Progress Notes (Signed)
Referring Physician(s): Kinsinger, De BlanchLuke Aaron   Supervising Physician: Gilmer MorWagner, Jaime  Patient Status:  [facility] - In-pt  Chief Complaint: None  Subjective:  Intraabdominal abscesses s/p drain placement x2 08/09/2018 with Dr. Loreta AveWagner. Patient awake and alert laying in bed with no complaints at this time. Both intraabdominal abscess drains c/d/i with minimal output in JP drains.   Allergies: Patient has no known allergies.  Medications: Prior to Admission medications   Medication Sig Start Date End Date Taking? Authorizing Provider  acetaminophen (TYLENOL) 325 MG tablet Take 650 mg by mouth every 6 (six) hours as needed (for pain or fever).    Yes [provider]  diphenoxylate-atropine (LOMOTIL) 2.5-0.025 MG tablet Take 2 tablets by mouth 3 (three) times daily as needed for diarrhea or loose stools.   Yes [provider]  doxycycline (VIBRAMYCIN) 100 MG capsule Take 100 mg by mouth 2 (two) times daily. FOR 10 DAYS 08/06/18 08/15/18 Yes [provider]  fluticasone (FLONASE) 50 MCG/ACT nasal spray Place 1 spray into both nostrils 2 (two) times daily. Patient not taking: Reported on 08/09/2018 05/15/18   Myles LippsSantiago, Irma M, MD  meloxicam (MOBIC) 7.5 MG tablet Take 1-2 tablets (7.5-15 mg total) by mouth daily. Patient not taking: Reported on 08/09/2018 06/20/18   Benjiman CoreWiseman, Brittany D, PA-C  ondansetron (ZOFRAN) 4 MG tablet Take 4 mg by mouth every 8 (eight) hours as needed for nausea or vomiting.    [provider]  predniSONE (DELTASONE) 20 MG tablet Take 3 PO QAM x2days, 2 PO QAM x2days, 1 PO QAM x2days 07/26/18   McVey, Madelaine BhatElizabeth Whitney, PA-C  ranitidine (ZANTAC) 150 MG tablet Take 1 tablet (150 mg total) by mouth 2 (two) times daily. Patient not taking: Reported on 08/09/2018 07/26/18   McVey, Madelaine BhatElizabeth Whitney, PA-C     Vital Signs: BP (!) 93/59 (BP Location: Right Arm)   Pulse 76   Temp 98.2 F (36.8 C) (Oral)   Resp 18   Ht 5\' 4"  (1.626 m)   Wt 142  lb 6.7 oz (64.6 kg)   LMP 08/07/2018 (Exact Date)   SpO2 99%   BMI 24.45 kg/m   Physical Exam  Constitutional: She is oriented to person, place, and time. She appears well-developed and well-nourished. No distress.  Pulmonary/Chest: Effort normal. No respiratory distress.  Abdominal: Soft. There is no tenderness.  Both intraabdominal drains with mild tenderness, no erythema or drainage; Anterior drain with minimal cloudy output and posterior/TG grain with minimal purulent output; both drains flush/aspirate without resistance.  Neurological: She is alert and oriented to person, place, and time.  Skin: Skin is warm and dry.  Psychiatric: She has a normal mood and affect. Her behavior is normal. Judgment and thought content normal.  Nursing note and vitals reviewed.   Imaging: Ct Image Guided Drainage By Percutaneous Catheter  Result Date: 08/13/2018 CLINICAL DATA:  47 year old female with a history of abscess EXAM: CT GUIDED DRAINAGE OF BODY PART ABSCESS ANESTHESIA/SEDATION: 2.0 mg IV Versed 100 mcg IV Fentanyl Total Moderate Sedation Time:  45 minutes PROCEDURE: The procedure, risks, benefits, and alternatives were explained to the patient. Questions regarding the procedure were encouraged and answered. The patient understands and consents to the procedure. The patient was brought to the CT scanner in position right decubitus. Scout images were acquired. The posterior gluteal region was prepped with chlorhexidinein a sterile fashion, and a sterile drape was applied covering the operative field. A sterile gown and sterile gloves were used for the procedure.  Local anesthesia was provided with 1% Lidocaine. After generous infiltration with 1% lidocaine, modified Seldinger technique was used to place a 10 [place] drain in a left trans gluteal approach into the abscess within the pelvis. Drainage catheter was attached to bulb suction and sutured in position. Using similar targeting, anterior approach  was selected for anterior fluid collection. Using modified Seldinger technique, a 10 French drain was placed into anterior fluid collection. The second drain was sutured in position and attached to bulb suction. Final CT was performed confirming location of the drains Patient tolerated the procedure well and remained hemodynamically stable throughout. No complications were encountered and no significant blood loss. COMPLICATIONS: None FINDINGS: CT in right decubitus position demonstrates complex fluid collection of the abdomen and the posterior pelvis. After placement of anterior approach drain and a left trans gluteal approach drain, there has been at least partial decompression of the fluid collection. Samples were sent from both drains for culture. IMPRESSION: Status post CT-guided drainage of anterior abdominal fluid collection and a pelvic fluid collection with 10 French drains. Cultures were sent. Signed, Yvone Neu. Reyne Dumas, RPVI V[facility] Interventional Radiology Special[facility] Radiology Electronically Signed   By: Gilmer Mor D.O.   On: 08/13/2018 08:10   Ct Image Guided Drainage Percut Cath  Peritoneal Retroperit  Result Date: 08/13/2018 CLINICAL DATA:  47 year old female with a history of abscess EXAM: CT GUIDED DRAINAGE OF BODY PART ABSCESS ANESTHESIA/SEDATION: 2.0 mg IV Versed 100 mcg IV Fentanyl Total Moderate Sedation Time:  45 minutes PROCEDURE: The procedure, risks, benefits, and alternatives were explained to the patient. Questions regarding the procedure were encouraged and answered. The patient understands and consents to the procedure. The patient was brought to the CT scanner in position right decubitus. Scout images were acquired. The posterior gluteal region was prepped with chlorhexidinein a sterile fashion, and a sterile drape was applied covering the operative field. A sterile gown and sterile gloves were used for the procedure. Local anesthesia was provided with 1%  Lidocaine. After generous infiltration with 1% lidocaine, modified Seldinger technique was used to place a 10 [place] drain in a left trans gluteal approach into the abscess within the pelvis. Drainage catheter was attached to bulb suction and sutured in position. Using similar targeting, anterior approach was selected for anterior fluid collection. Using modified Seldinger technique, a 10 French drain was placed into anterior fluid collection. The second drain was sutured in position and attached to bulb suction. Final CT was performed confirming location of the drains Patient tolerated the procedure well and remained hemodynamically stable throughout. No complications were encountered and no significant blood loss. COMPLICATIONS: None FINDINGS: CT in right decubitus position demonstrates complex fluid collection of the abdomen and the posterior pelvis. After placement of anterior approach drain and a left trans gluteal approach drain, there has been at least partial decompression of the fluid collection. Samples were sent from both drains for culture. IMPRESSION: Status post CT-guided drainage of anterior abdominal fluid collection and a pelvic fluid collection with 10 French drains. Cultures were sent. Signed, Yvone Neu. Reyne Dumas, RPVI Vascular and Interventional Radiology Specialists [facility] Radiology Electronically Signed   By: Gilmer Mor D.O.   On: 08/13/2018 08:10    Labs:  CBC: Recent Labs    08/11/18 0347 08/12/18 0351 08/13/18 0413 08/14/18 0530  WBC 10.5 9.8 8.2 7.8  HGB 9.0* 9.1* 10.5* 10.7*  HCT 27.3* 28.0* 32.5* 34.0*  PLT 360 399 505* 573*    COAGS: Recent Labs  08/10/18 0639  INR 1.11    BMP: Recent Labs    08/11/18 0347 08/12/18 0351 08/13/18 0413 08/14/18 0530  NA 135 136 137 137  K 3.9 3.9 4.7 4.2  CL 103 103 102 99  CO2 25 26 28 28   GLUCOSE 109* 111* 116* 103*  BUN 8 <5* <5* 6  CALCIUM 8.1* 8.1* 8.7* 8.6*  CREATININE 0.60 0.65 0.64 0.71  GFRNONAA >60  >60 >60 >60  GFRAA >60 >60 >60 >60    LIVER FUNCTION TESTS: Recent Labs    08/11/18 0347 08/12/18 0351 08/13/18 0413 08/14/18 0530  BILITOT 0.8 0.5 0.3 0.3  AST 12* 10* 13* 26  ALT 26 22 18 20   ALKPHOS 93 92 91 80  PROT 5.3* 5.3* 6.1* 5.9*  ALBUMIN 1.9* 2.0* 2.2* 2.3*    Assessment and Plan:  Intraabdominal abscesses s/p drain placement x2 08/09/2018 with Dr. Loreta Ave. Both intraabdominal drains stable with minimal output. Continue with Qshift flushes and monitoring of output. Appreciate and agree with [facility] management. IR to follow.   Electronically Signed: Elwin Mocha, PA-C 08/14/2018, 11:10 AM   I spent a total of 15 Minutes at the the patient's bedside AND on the patient's hospital floor or unit, greater than 50% of which was counseling/coordinating care for intraabdominal abscesses s/p drain placement x2.

## 2018-08-15 ENCOUNTER — Inpatient Hospital Stay (HOSPITAL_COMMUNITY): Payer: BLUE CROSS/BLUE SHIELD

## 2018-08-15 LAB — CBC
HEMATOCRIT: 35.9 % — AB (ref 36.0–46.0)
HEMOGLOBIN: 11.4 g/dL — AB (ref 12.0–15.0)
MCH: 27.9 pg (ref 26.0–34.0)
MCHC: 31.8 g/dL (ref 30.0–36.0)
MCV: 88 fL (ref 78.0–100.0)
Platelets: 609 10*3/uL — ABNORMAL HIGH (ref 150–400)
RBC: 4.08 MIL/uL (ref 3.87–5.11)
RDW: 13.6 % (ref 11.5–15.5)
WBC: 7.5 10*3/uL (ref 4.0–10.5)

## 2018-08-15 LAB — BASIC METABOLIC PANEL
Anion gap: 10 (ref 5–15)
BUN: 8 mg/dL (ref 6–20)
CHLORIDE: 96 mmol/L — AB (ref 98–111)
CO2: 31 mmol/L (ref 22–32)
CREATININE: 0.7 mg/dL (ref 0.44–1.00)
Calcium: 9 mg/dL (ref 8.9–10.3)
GFR calc non Af Amer: >60 mL/min (ref >60)
Glucose, Bld: 101 mg/dL — ABNORMAL HIGH (ref 70–99)
POTASSIUM: 5 mmol/L (ref 3.5–5.1)
Sodium: 137 mmol/L (ref 135–145)

## 2018-08-15 MED ORDER — IOPAMIDOL (ISOVUE-300) INJECTION 61%
INTRAVENOUS | Status: AC
  Filled 2018-08-15: qty 30

## 2018-08-15 MED ORDER — ENOXAPARIN SODIUM 40 MG/0.4ML ~~LOC~~ SOLN
40.0000 mg | Freq: Every day | SUBCUTANEOUS | Status: DC
  Administered 2018-08-17 – 2018-08-21 (×4): 40 mg via SUBCUTANEOUS
  Filled 2018-08-15 (×6): qty 0.4

## 2018-08-15 MED ORDER — SODIUM CHLORIDE 0.9 % IV BOLUS
500.0000 mL | Freq: Once | INTRAVENOUS | Status: AC
  Administered 2018-08-15: 500 mL via INTRAVENOUS

## 2018-08-15 MED ORDER — IOPAMIDOL (ISOVUE-300) INJECTION 61%
100.0000 mL | Freq: Once | INTRAVENOUS | Status: AC | PRN
  Administered 2018-08-15: 100 mL via INTRAVENOUS

## 2018-08-15 NOTE — Progress Notes (Signed)
Patient reports less itching and has slept better after taking the benadryl for the itching.  Patient tolerates getting out of the bed well.

## 2018-08-15 NOTE — Progress Notes (Signed)
[facility] Progress Note     Subjective: CC-  Resting comfortably in bed. Translator via telephone used to interpret. Overall feeling better. Abdomen a little sore, but pain improving. States that she does not like the fulls liquids so she is only drinking Ensure which gives her diarrhea. 3 episodes of diarrhea yesterday. Denies n/v. Has been ambulating to rest room independently without issues. Denies lightheadedness or dizziness.  Objective: Vital signs in last 24 hours: Temp:  [97.8 F (36.6 C)-99.6 F (37.6 C)] 98.2 F (36.8 C) (09/18 0512) Pulse Rate:  [74-89] 74 (09/18 0512) Resp:  [14-18] 17 (09/18 0512) BP: (89-91)/(54-62) 90/62 (09/18 0512) SpO2:  [99 %-100 %] 100 % (09/18 0512) Last BM Date: 08/14/18  Intake/Output from previous day: 09/17 0701 - 09/18 0700 In: 490 [P.O.:480] Out: 35 [Drains:35] Intake/Output this shift: No intake/output data recorded.  PE: Gen:  Alert, NAD, pleasant HEENT: EOM's intact, pupils equal and round Pulm:  effort normal Abd: Soft, ND, mild RLQ TTP, nontender LLQ or upper abdomen, +BS, both drains with SS fluid (20cc and 15cc recorded ouput from drains last 24 hours) Ext:  Calves soft and nontender Psych: A&Ox3  Skin: no rashes noted, warm and dry  Lab Results:  Recent Labs    08/14/18 0530 08/15/18 0435  WBC 7.8 7.5  HGB 10.7* 11.4*  HCT 34.0* 35.9*  PLT 573* 609*   BMET Recent Labs    08/14/18 0530 08/15/18 0435  NA 137 137  K 4.2 5.0  CL 99 96*  CO2 28 31  GLUCOSE 103* 101*  BUN 6 8  CREATININE 0.71 0.70  CALCIUM 8.6* 9.0   PT/INR No results for input(s): LABPROT, INR in the last 72 hours. CMP     Component Value Date/Time   NA 137 08/15/2018 0435   NA 138 05/15/2018 0954   K 5.0 08/15/2018 0435   CL 96 (L) 08/15/2018 0435   CO2 31 08/15/2018 0435   GLUCOSE 101 (H) 08/15/2018 0435   BUN 8 08/15/2018 0435   BUN 10 05/15/2018 0954   CREATININE 0.70 08/15/2018 0435   CALCIUM 9.0 08/15/2018  0435   PROT 5.9 (L) 08/14/2018 0530   PROT 7.2 05/15/2018 0954   ALBUMIN 2.3 (L) 08/14/2018 0530   ALBUMIN 4.1 05/15/2018 0954   AST 26 08/14/2018 0530   ALT 20 08/14/2018 0530   ALKPHOS 80 08/14/2018 0530   BILITOT 0.3 08/14/2018 0530   BILITOT 0.4 05/15/2018 0954   GFRNONAA >60 08/15/2018 0435   GFRAA >60 08/15/2018 0435   Lipase     Component Value Date/Time   LIPASE 55 (H) 08/09/2018 1024       Studies/Results: No results found.  Anti-infectives: Anti-infectives (From admission, onward)   Start     Dose/Rate Route Frequency Ordered Stop   08/12/18 1800  meropenem (MERREM) 1 g in sodium chloride 0.9 % 100 mL IVPB     1 g 200 mL/hr over 30 Minutes Intravenous Every 12 hours 08/12/18 1637     08/10/18 1000  cefTRIAXone (ROCEPHIN) 2 g in sodium chloride 0.9 % 100 mL IVPB  Status:  Discontinued     2 g 200 mL/hr over 30 Minutes Intravenous Every 24 hours 08/09/18 2230 08/12/18 1637   08/10/18 0600  metroNIDAZOLE (FLAGYL) IVPB 500 mg  Status:  Discontinued     500 mg 100 mL/hr over 60 Minutes Intravenous Every 8 hours 08/09/18 2230 08/12/18 1637   08/09/18 2030  cefTRIAXone (ROCEPHIN) 1 g in sodium  chloride 0.9 % 100 mL IVPB     1 g 200 mL/hr over 30 Minutes Intravenous  Once 08/09/18 2024 08/09/18 2119   08/09/18 2030  metroNIDAZOLE (FLAGYL) IVPB 500 mg     500 mg 100 mL/hr over 60 Minutes Intravenous  Once 08/09/18 2024 08/09/18 2236       Assessment/Plan History of C-section 12/09/2014   Perforated appendicitis with multiple abscesses - IRdrain placement x2 08/10/2018 - E Coli from the drain fluid - fairly low output from drains, 15cc and 20cc last 24 hours  Diarrhea- GI panelnegative   ID -rocephin/flagyl 9/12>>9/125; Meropenem 9/15>>day 4 FEN -IVF, soft diet VTE -SCDs, lovenox Foley -none  Plan:  CT scan today. Continue IV abx. Ok for soft diet as tolerated.    LOS: 6 days    Franne FortsBrooke A Meuth , [facility] 08/15/2018,  9:19 AM Pager: [phone] Consults: [phone] Mon 7:00 am -11:30 AM Tues-Fri 7:00 am-4:30 pm Sat-Sun 7:00 am-11:30 am

## 2018-08-15 NOTE — Progress Notes (Signed)
A family friend is at the bedside for assist with interpretation.  The patient has reported to a friend that she does not feel comfortable with the interpretation line.  Several questions were answered through the mutual friend via her language.  The patient is being transported to  CT via bed with the friend to interpret for her.  The patient was told that she may have someone bring her food that she likes and is familiar with.

## 2018-08-15 NOTE — Progress Notes (Signed)
Patient co having itching and a rash over the back of her legs.  Benadryl given as ordered for itching

## 2018-08-15 NOTE — Progress Notes (Signed)
Informed CT that pt has new Peripheral IV#22LFA and is ready for CT.

## 2018-08-15 NOTE — Progress Notes (Signed)
Patient has completed oral contrast- scheduled for CT of Abd at 1030

## 2018-08-16 LAB — CBC
HEMATOCRIT: 33.5 % — AB (ref 36.0–46.0)
Hemoglobin: 10.6 g/dL — ABNORMAL LOW (ref 12.0–15.0)
MCH: 28 pg (ref 26.0–34.0)
MCHC: 31.6 g/dL (ref 30.0–36.0)
MCV: 88.6 fL (ref 78.0–100.0)
Platelets: 647 10*3/uL — ABNORMAL HIGH (ref 150–400)
RBC: 3.78 MIL/uL — ABNORMAL LOW (ref 3.87–5.11)
RDW: 13.6 % (ref 11.5–15.5)
WBC: 8.3 10*3/uL (ref 4.0–10.5)

## 2018-08-16 LAB — BASIC METABOLIC PANEL
ANION GAP: 5 (ref 5–15)
BUN: 7 mg/dL (ref 6–20)
CALCIUM: 8.6 mg/dL — AB (ref 8.9–10.3)
CO2: 31 mmol/L (ref 22–32)
CREATININE: 0.64 mg/dL (ref 0.44–1.00)
Chloride: 100 mmol/L (ref 98–111)
Glucose, Bld: 110 mg/dL — ABNORMAL HIGH (ref 70–99)
Potassium: 4.5 mmol/L (ref 3.5–5.1)
Sodium: 136 mmol/L (ref 135–145)

## 2018-08-16 MED ORDER — SACCHAROMYCES BOULARDII 250 MG PO CAPS
250.0000 mg | ORAL_CAPSULE | Freq: Two times a day (BID) | ORAL | Status: DC
  Administered 2018-08-16 – 2018-08-21 (×11): 250 mg via ORAL
  Filled 2018-08-16 (×11): qty 1

## 2018-08-16 NOTE — Progress Notes (Signed)
CC: Abdominal pain  Subjective: Patient says her pain is much improved.  She describes it is 1 when she is up walking.  She is having bowel movements which are not painful but they are loose.  Currently drains have less than 1 cc in each, both seem fairly clear today.  Objective: Vital signs in last 24 hours: Temp:  [97.6 F (36.4 C)-98.2 F (36.8 C)] 98 F (36.7 C) (09/19 0413) Pulse Rate:  [73-85] 73 (09/19 0413) Resp:  [16-20] 16 (09/19 0413) BP: (90-98)/(54-64) 97/64 (09/19 0413) SpO2:  [99 %-100 %] 99 % (09/19 0413) Last BM Date: 08/14/18 960 PO 1300 IV 525 urine recorded Drains - 0 recorded Afebrile, VSS Labs OK WBC stillnormal . dextrose 5 % and 0.45 % NaCl with KCl 20 mEq/L 75 mL/hr at 08/15/18 1846  . meropenem (MERREM) IV 1 g (08/16/18 0602)  CT completed yesterday and reviewed by Dr. Fredia Sorrow.  The 2 fluid collections with drains have been well drained and they anticipate removing those drains in the next 24 hours.  There is a new persistent irregular shaped abscess measuring 5.7 x 3.7 centrally in the pelvis which is likely near the ruptured appendix.  This is not amenable to percutaneous drainage.  Intake/Output from previous day: 09/18 0701 - 09/19 0700 In: 2293.4 [P.O.:960; I.V.:1303.4] Out: 525 [Urine:525] Intake/Output this shift: No intake/output data recorded.  General appearance: alert, cooperative and no distress Resp: clear to auscultation bilaterally GI: Soft, minimal tenderness.  Both drains appear fairly clear with minimal output today.  Lab Results:  Recent Labs    08/15/18 0435 08/16/18 0508  WBC 7.5 8.3  HGB 11.4* 10.6*  HCT 35.9* 33.5*  PLT 609* 647*    BMET Recent Labs    08/15/18 0435 08/16/18 0508  NA 137 136  K 5.0 4.5  CL 96* 100  CO2 31 31  GLUCOSE 101* 110*  BUN 8 7  CREATININE 0.70 0.64  CALCIUM 9.0 8.6*   PT/INR No results for input(s): LABPROT, INR in the last 72 hours.  Recent Labs  Lab 08/10/18 0401  08/11/18 0347 08/12/18 0351 08/13/18 0413 08/14/18 0530  AST 17 12* 10* 13* 26  ALT 43 26 22 18 20   ALKPHOS 124 93 92 91 80  BILITOT 0.7 0.8 0.5 0.3 0.3  PROT 5.5* 5.3* 5.3* 6.1* 5.9*  ALBUMIN 2.1* 1.9* 2.0* 2.2* 2.3*     Lipase     Component Value Date/Time   LIPASE 55 (H) 08/09/2018 1024     Medications: . [START ON 08/17/2018] enoxaparin (LOVENOX) injection  40 mg Subcutaneous Daily  . feeding supplement (ENSURE ENLIVE)  237 mL Oral BID BM  . sodium chloride flush  5 mL Intracatheter Q8H  E coli from the drain placement  AMPICILLIN >=32 RESIST... Resistant    AMPICILLIN/SULBACTAM >=32 RESIST... Resistant    CEFAZOLIN >=64 RESIST... Resistant    CEFEPIME RESISTANT  Resistant    CEFTAZIDIME RESISTANT  Resistant    CEFTRIAXONE >=64 RESIST... Resistant    CIPROFLOXACIN >=4 RESISTANT  Resistant    Extended ESBL POSITIVE  Resistant    GENTAMICIN <=1 SENSITIVE  Sensitive    IMIPENEM <=0.25 SENS... Sensitive    PIP/TAZO <=4 SENSITIVE  Sensitive    TRIMETH/SULFA >=320 RESIS... Resistant      Assessment/Plan  History of C-section 12/09/2014   Perforated appendicitis with multiple abscesses - IRdrain placement x2 08/10/2018 - E Coli from the drain fluid - fairly low output from drains, 15cc and  20cc last 24 hours  Diarrhea- GI panelnegative   ID -rocephin/flagyl 9/12>>9/125; Meropenem 9/15>>day5 ( abscess + E coli) FEN -IVF, soft diet VTE -SCDs, lovenox Foley -none  Plan: I put the patient back on a soft diet.  Ongoing saline lock her IV.  I told her to get up and around.  I started her on probiotic.  Will discuss antibiotic treatment regime with Dr. Luisa Hartornett. As noted above the E. coli is sensitive only to gentamicin, imipenem, and Zosyn.  LOS: 7 days    Kody Brandl 08/16/2018 [phone]

## 2018-08-16 NOTE — Progress Notes (Signed)
Patient was medicated for itching as ordered.

## 2018-08-16 NOTE — Progress Notes (Signed)
Referring Physician(s): Kinsinger,L  Supervising Physician: Irish Lack  Patient Status:  [name] - In-pt  Chief Complaint:  Abdominal pain/abscesses  Subjective: Pt doing ok today; has some soreness at drain sites as expected; no n/v   Allergies: Patient has no known allergies.  Medications: Prior to Admission medications   Medication Sig Start Date End Date Taking? Authorizing Provider  acetaminophen (TYLENOL) 325 MG tablet Take 650 mg by mouth every 6 (six) hours as needed (for pain or fever).    Yes [provider]  diphenoxylate-atropine (LOMOTIL) 2.5-0.025 MG tablet Take 2 tablets by mouth 3 (three) times daily as needed for diarrhea or loose stools.   Yes [provider]  fluticasone (FLONASE) 50 MCG/ACT nasal spray Place 1 spray into both nostrils 2 (two) times daily. Patient not taking: Reported on 08/09/2018 05/15/18   Myles Lipps, MD  meloxicam (MOBIC) 7.5 MG tablet Take 1-2 tablets (7.5-15 mg total) by mouth daily. Patient not taking: Reported on 08/09/2018 06/20/18   Benjiman Core D, PA-C  ondansetron (ZOFRAN) 4 MG tablet Take 4 mg by mouth every 8 (eight) hours as needed for nausea or vomiting.    [provider]  predniSONE (DELTASONE) 20 MG tablet Take 3 PO QAM x2days, 2 PO QAM x2days, 1 PO QAM x2days 07/26/18   McVey, Madelaine Bhat, PA-C  ranitidine (ZANTAC) 150 MG tablet Take 1 tablet (150 mg total) by mouth 2 (two) times daily. Patient not taking: Reported on 08/09/2018 07/26/18   McVey, Madelaine Bhat, PA-C     Vital Signs: BP 97/64 (BP Location: Right Arm)   Pulse 73   Temp 98 F (36.7 C) (Oral)   Resp 16   Ht 5\' 4"  (1.626 m)   Wt 142 lb 6.7 oz (64.6 kg)   LMP 08/07/2018 (Exact Date)   SpO2 99%   BMI 24.45 kg/m   Physical Exam  Left ant/post pelvic drains intact, outputs minimal; insertion sites ok, mildly tender  Imaging: Ct Abdomen Pelvis W Contrast  Result Date: 08/15/2018 CLINICAL DATA:  Status post  abscess drainage. EXAM: CT ABDOMEN AND PELVIS WITH CONTRAST TECHNIQUE: Multidetector CT imaging of the abdomen and pelvis was performed using the standard protocol following bolus administration of intravenous contrast. CONTRAST:  ISOVUE-300 IOPAMIDOL (ISOVUE-300) INJECTION 61% COMPARISON:  CT scans of August 10, 2018 and August 09, 2018. FINDINGS: Lower chest: No acute abnormality. Hepatobiliary: No focal liver abnormality is seen. No gallstones, gallbladder wall thickening, or biliary dilatation. Pancreas: Unremarkable. No pancreatic ductal dilatation or surrounding inflammatory changes. Spleen: Normal in size without focal abnormality. Adrenals/Urinary Tract: Adrenal glands appear normal. Left renal cyst is noted. No hydronephrosis or renal obstruction is noted. Urinary bladder is unremarkable. Stomach/Bowel: The stomach appears normal. There is no evidence of bowel obstruction. Wall thickening of sigmoid colon is noted which may represent secondary inflammation. The proximal portion of the appendix appears normal with appendicoliths and thickening seen distally with adjacent fluid collection suggesting ruptured appendicitis. This fluid collection measures 5.7 x 3.7 cm in size. This is not significantly changed compared to prior exam. Vascular/Lymphatic: No significant vascular findings are present. No enlarged abdominal or pelvic lymph nodes. Reproductive: Uterus and bilateral adnexa are unremarkable. Other: 2 other large fluid collections noted in pelvis on prior exam are completely decompressed status post drainage catheter placement. Musculoskeletal: No acute or significant osseous findings. IMPRESSION: Status post percutaneous catheter placement into 2 fluid collections in the pelvis, 1 anteriorly and the other posteriorly. These collections appear to  be completely decompressed at this time. However, persistent irregular shaped abscess measuring 5.7 x 3.7 cm is noted centrally in the pelvis which  most likely is secondary to ruptured appendicitis. Electronically Signed   By: Lupita RaiderJames  Green Jr, M.D.   On: 08/15/2018 15:48    Labs:  CBC: Recent Labs    08/13/18 0413 08/14/18 0530 08/15/18 0435 08/16/18 0508  WBC 8.2 7.8 7.5 8.3  HGB 10.5* 10.7* 11.4* 10.6*  HCT 32.5* 34.0* 35.9* 33.5*  PLT 505* 573* 609* 647*    COAGS: Recent Labs    08/10/18 0639  INR 1.11    BMP: Recent Labs    08/13/18 0413 08/14/18 0530 08/15/18 0435 08/16/18 0508  NA 137 137 137 136  K 4.7 4.2 5.0 4.5  CL 102 99 96* 100  CO2 28 28 31 31   GLUCOSE 116* 103* 101* 110*  BUN <5* 6 8 7   CALCIUM 8.7* 8.6* 9.0 8.6*  CREATININE 0.64 0.71 0.70 0.64  GFRNONAA >60 >60 >60 >60  GFRAA >60 >60 >60 >60    LIVER FUNCTION TESTS: Recent Labs    08/11/18 0347 08/12/18 0351 08/13/18 0413 08/14/18 0530  BILITOT 0.8 0.5 0.3 0.3  AST 12* 10* 13* 26  ALT 26 22 18 20   ALKPHOS 93 92 91 80  PROT 5.3* 5.3* 6.1* 5.9*  ALBUMIN 1.9* 2.0* 2.2* 2.3*    Assessment and Plan: Pt with hx perforated appendicitis/abd-pelvic abscesses, s/p perc drains 9/13; afebrile; WBC nl; hgb stable; creat nl; fluid cx- e coli/viridans strept/bacteroides; f/u CT yesterday revealed:  Status post percutaneous catheter placement into 2 fluid collections in the pelvis, 1 anteriorly and the other posteriorly. These collections appear to be completely decompressed at this time. However, persistent irregular shaped abscess measuring 5.7 x 3.7 cm is noted centrally in the pelvis which most likely is secondary to ruptured appendicitis Images reviewed by Dr. Fredia SorrowYamagata and central collection is not amenable to perc drainage at this time; cont IV antbx; consider removing existing drains in next 24 hours if output remains minimal; recheck CT within 1 week   Electronically Signed: D. Jeananne RamaKevin Allred, PA-C 08/16/2018, 9:10 AM   I spent a total of 20 minutes at the the patient's bedside AND on the patient's hospital floor or unit, greater  than 50% of which was counseling/coordinating care for abdomino-pelvic abscess drains    Patient ID: Sierra DuncansKim H Hensley, female   DOB: 08/22/1971, 47 y.o.   MRN: [id number]

## 2018-08-17 ENCOUNTER — Other Ambulatory Visit: Payer: Self-pay | Admitting: Student

## 2018-08-17 MED ORDER — MORPHINE SULFATE (PF) 2 MG/ML IV SOLN: 2.0000 mg | INTRAVENOUS | Status: DC | PRN

## 2018-08-17 MED ORDER — OXYCODONE HCL 5 MG PO TABS
5.0000 mg | ORAL_TABLET | ORAL | Status: DC | PRN
  Administered 2018-08-18: 5 mg via ORAL
  Filled 2018-08-17 (×2): qty 1

## 2018-08-17 NOTE — Progress Notes (Signed)
[facility] Progress Note     Subjective: CC-  Resting comfortably in bed. Abdomen a little sore still at times, but much improved. Denies any n/v. Tolerating small amounts of soft diet. She did have a couple bouts of diarrhea yesterday, none so far today. Drains with little to no output.  Objective: Vital signs in last 24 hours: Temp:  [97.6 F (36.4 C)-98.6 F (37 C)] 98.6 F (37 C) (09/19 2300) Pulse Rate:  [78-84] 78 (09/20 0609) Resp:  [15-16] 15 (09/20 0609) BP: (99-100)/(62-63) 99/63 (09/20 0609) SpO2:  [100 %] 100 % (09/20 0609) Last BM Date: 08/16/18  Intake/Output from previous day: 09/19 0701 - 09/20 0700 In: 600 [P.O.:480; I.V.:10; IV Piggyback:100] Out: 820 [Urine:800; Drains:20] Intake/Output this shift: No intake/output data recorded.  PE: Gen:  Alert, NAD, pleasant HEENT: EOM's intact, pupils equal and round Pulm:  effort normal Abd: Soft, ND, NT, +BS, both drains with minimal serous fluid (10cc and 10cc recorded ouput from drains last 24 hours) Ext:  Calves soft and nontender Psych: A&Ox3  Skin: no rashes noted, warm and dry  Lab Results:  Recent Labs    08/15/18 0435 08/16/18 0508  WBC 7.5 8.3  HGB 11.4* 10.6*  HCT 35.9* 33.5*  PLT 609* 647*   BMET Recent Labs    08/15/18 0435 08/16/18 0508  NA 137 136  K 5.0 4.5  CL 96* 100  CO2 31 31  GLUCOSE 101* 110*  BUN 8 7  CREATININE 0.70 0.64  CALCIUM 9.0 8.6*   PT/INR No results for input(s): LABPROT, INR in the last 72 hours. CMP     Component Value Date/Time   NA 136 08/16/2018 0508   NA 138 05/15/2018 0954   K 4.5 08/16/2018 0508   CL 100 08/16/2018 0508   CO2 31 08/16/2018 0508   GLUCOSE 110 (H) 08/16/2018 0508   BUN 7 08/16/2018 0508   BUN 10 05/15/2018 0954   CREATININE 0.64 08/16/2018 0508   CALCIUM 8.6 (L) 08/16/2018 0508   PROT 5.9 (L) 08/14/2018 0530   PROT 7.2 05/15/2018 0954   ALBUMIN 2.3 (L) 08/14/2018 0530   ALBUMIN 4.1 05/15/2018 0954   AST 26  08/14/2018 0530   ALT 20 08/14/2018 0530   ALKPHOS 80 08/14/2018 0530   BILITOT 0.3 08/14/2018 0530   BILITOT 0.4 05/15/2018 0954   GFRNONAA >60 08/16/2018 0508   GFRAA >60 08/16/2018 0508   Lipase     Component Value Date/Time   LIPASE 55 (H) 08/09/2018 1024       Studies/Results: Ct Abdomen Pelvis W Contrast  Result Date: 08/15/2018 CLINICAL DATA:  Status post abscess drainage. EXAM: CT ABDOMEN AND PELVIS WITH CONTRAST TECHNIQUE: Multidetector CT imaging of the abdomen and pelvis was performed using the standard protocol following bolus administration of intravenous contrast. CONTRAST:  ISOVUE-300 IOPAMIDOL (ISOVUE-300) INJECTION 61% COMPARISON:  CT scans of August 10, 2018 and August 09, 2018. FINDINGS: Lower chest: No acute abnormality. Hepatobiliary: No focal liver abnormality is seen. No gallstones, gallbladder wall thickening, or biliary dilatation. Pancreas: Unremarkable. No pancreatic ductal dilatation or surrounding inflammatory changes. Spleen: Normal in size without focal abnormality. Adrenals/Urinary Tract: Adrenal glands appear normal. Left renal cyst is noted. No hydronephrosis or renal obstruction is noted. Urinary bladder is unremarkable. Stomach/Bowel: The stomach appears normal. There is no evidence of bowel obstruction. Wall thickening of sigmoid colon is noted which may represent secondary inflammation. The proximal portion of the appendix appears normal with appendicoliths and thickening seen  distally with adjacent fluid collection suggesting ruptured appendicitis. This fluid collection measures 5.7 x 3.7 cm in size. This is not significantly changed compared to prior exam. Vascular/Lymphatic: No significant vascular findings are present. No enlarged abdominal or pelvic lymph nodes. Reproductive: Uterus and bilateral adnexa are unremarkable. Other: 2 other large fluid collections noted in pelvis on prior exam are completely decompressed status post drainage  catheter placement. Musculoskeletal: No acute or significant osseous findings. IMPRESSION: Status post percutaneous catheter placement into 2 fluid collections in the pelvis, 1 anteriorly and the other posteriorly. These collections appear to be completely decompressed at this time. However, persistent irregular shaped abscess measuring 5.7 x 3.7 cm is noted centrally in the pelvis which most likely is secondary to ruptured appendicitis. Electronically Signed   By: Lupita RaiderJames  Green Jr, M.D.   On: 08/15/2018 15:48    Anti-infectives: Anti-infectives (From admission, onward)   Start     Dose/Rate Route Frequency Ordered Stop   08/12/18 1800  meropenem (MERREM) 1 g in sodium chloride 0.9 % 100 mL IVPB     1 g 200 mL/hr over 30 Minutes Intravenous Every 12 hours 08/12/18 1637     08/10/18 1000  cefTRIAXone (ROCEPHIN) 2 g in sodium chloride 0.9 % 100 mL IVPB  Status:  Discontinued     2 g 200 mL/hr over 30 Minutes Intravenous Every 24 hours 08/09/18 2230 08/12/18 1637   08/10/18 0600  metroNIDAZOLE (FLAGYL) IVPB 500 mg  Status:  Discontinued     500 mg 100 mL/hr over 60 Minutes Intravenous Every 8 hours 08/09/18 2230 08/12/18 1637   08/09/18 2030  cefTRIAXone (ROCEPHIN) 1 g in sodium chloride 0.9 % 100 mL IVPB     1 g 200 mL/hr over 30 Minutes Intravenous  Once 08/09/18 2024 08/09/18 2119   08/09/18 2030  metroNIDAZOLE (FLAGYL) IVPB 500 mg     500 mg 100 mL/hr over 60 Minutes Intravenous  Once 08/09/18 2024 08/09/18 2236       Assessment/Plan History of C-section 12/09/2014   Perforated appendicitis with multiple abscesses - IRdrain placement x2 08/10/2018 - E Coli from the drain fluid - CT 9/18 showed 2 fluid collections s/p drain that are completely decompressed, and persistent abscess 5/7x3/7cm that cannot be drainaged - low output from drains, 10cc and 10cc last 24 hours  Diarrhea- GI panelnegative   ID -rocephin/flagyl 9/12>>9/125; Meropenem 9/15>>day6/10 FEN -KVO IVF,  soft diet VTE -SCDs, lovenox Foley -none  Plan: Drains to be removed by IR. Continue IV abx. Will need a total of 10 days IV abx because her cultures were not sensitive to any oral abx. Continue ambulating.   LOS: 8 days    Franne FortsBrooke A Meuth , [facility] 08/17/2018, 9:14 AM Pager: [phone] Consults: [phone] Mon 7:00 am -11:30 AM Tues-Fri 7:00 am-4:30 pm Sat-Sun 7:00 am-11:30 am

## 2018-08-17 NOTE — Plan of Care (Signed)
  Problem: Activity: Goal: Risk for activity intolerance will decrease Outcome: Progressing   

## 2018-08-18 NOTE — Progress Notes (Signed)
   Subjective/Chief Complaint: No complaints   Objective: Vital signs in last 24 hours: Temp:  [97.6 F (36.4 C)-98 F (36.7 C)] 98 F (36.7 C) (09/21 0528) Pulse Rate:  [70-82] 70 (09/21 0528) Resp:  [18] 18 (09/20 1007) BP: (96-103)/(56-69) 99/59 (09/21 0528) SpO2:  [100 %] 100 % (09/21 0528) Last BM Date: 08/17/18  Intake/Output from previous day: 09/20 0701 - 09/21 0700 In: 120 [P.O.:120] Out: -  Intake/Output this shift: No intake/output data recorded.  General appearance: alert and cooperative Resp: clear to auscultation bilaterally Cardio: regular rate and rhythm GI: soft, nontender. drain output serous  Lab Results:  Recent Labs    08/16/18 0508  WBC 8.3  HGB 10.6*  HCT 33.5*  PLT 647*   BMET Recent Labs    08/16/18 0508  NA 136  K 4.5  CL 100  CO2 31  GLUCOSE 110*  BUN 7  CREATININE 0.64  CALCIUM 8.6*   PT/INR No results for input(s): LABPROT, INR in the last 72 hours. ABG No results for input(s): PHART, HCO3 in the last 72 hours.  Invalid input(s): PCO2, PO2  Studies/Results: No results found.  Anti-infectives: Anti-infectives (From admission, onward)   Start     Dose/Rate Route Frequency Ordered Stop   08/12/18 1800  meropenem (MERREM) 1 g in sodium chloride 0.9 % 100 mL IVPB     1 g 200 mL/hr over 30 Minutes Intravenous Every 12 hours 08/12/18 1637     08/10/18 1000  cefTRIAXone (ROCEPHIN) 2 g in sodium chloride 0.9 % 100 mL IVPB  Status:  Discontinued     2 g 200 mL/hr over 30 Minutes Intravenous Every 24 hours 08/09/18 2230 08/12/18 1637   08/10/18 0600  metroNIDAZOLE (FLAGYL) IVPB 500 mg  Status:  Discontinued     500 mg 100 mL/hr over 60 Minutes Intravenous Every 8 hours 08/09/18 2230 08/12/18 1637   08/09/18 2030  cefTRIAXone (ROCEPHIN) 1 g in sodium chloride 0.9 % 100 mL IVPB     1 g 200 mL/hr over 30 Minutes Intravenous  Once 08/09/18 2024 08/09/18 2119   08/09/18 2030  metroNIDAZOLE (FLAGYL) IVPB 500 mg     500 mg 100  mL/hr over 60 Minutes Intravenous  Once 08/09/18 2024 08/09/18 2236      Assessment/Plan: s/p * No surgery found * Advance diet  Continue IV abx Perforated appendicitis with multiple abscesses - IRdrain placementx29/13/2019 - E Coli from the drain fluid - CT 9/18 showed 2 fluid collections s/p drain that are completely decompressed, and persistent abscess 5/7x3/7cm that cannot be drainaged - low output from drains, 10cc and 10cc last 24 hours  Diarrhea- GI panelnegative   ID -rocephin/flagyl 10/12>>10/125; Meropenem 10/15>>day7/10 FEN -KVO IVF, soft diet VTE -SCDs, lovenox Foley -none  Plan:Drains to be removed by IR. Continue IV abx. Will need a total of 10 days IV abx because her cultures were not sensitive to any oral abx. Continue ambulating.  LOS: 9 days    TOTH III,PAUL S 08/18/2018

## 2018-08-18 NOTE — Progress Notes (Signed)
Referring Physician(s): Kinsinger,L  Supervising Physician: Irish LackYamagata, Glenn  [facility]  [facility]  Chief Complaint: Abdominal pain/abscesses   Subjective: Patient doing okay; no new complaints.   Allergies: Patient has no known allergies.  Medications: Prior to Admission medications   Medication Sig Start Date End Date Taking? Authorizing Provider  acetaminophen (TYLENOL) 325 MG tablet Take 650 mg by mouth every 6 (six) hours as needed (for pain or fever).    Yes [provider]  diphenoxylate-atropine (LOMOTIL) 2.5-0.025 MG tablet Take 2 tablets by mouth 3 (three) times daily as needed for diarrhea or loose stools.   Yes [provider]  fluticasone (FLONASE) 50 MCG/ACT nasal spray Place 1 spray into both nostrils 2 (two) times daily. Patient not taking: Reported on 08/09/2018 05/15/18   Myles LippsSantiago, Irma M, MD  meloxicam (MOBIC) 7.5 MG tablet Take 1-2 tablets (7.5-15 mg total) by mouth daily. Patient not taking: Reported on 08/09/2018 06/20/18   Benjiman CoreWiseman, Brittany D, PA-C  ondansetron (ZOFRAN) 4 MG tablet Take 4 mg by mouth every 8 (eight) hours as needed for nausea or vomiting.    [provider]  predniSONE (DELTASONE) 20 MG tablet Take 3 PO QAM x2days, 2 PO QAM x2days, 1 PO QAM x2days 07/26/18   McVey, Madelaine BhatElizabeth Whitney, PA-C  ranitidine (ZANTAC) 150 MG tablet Take 1 tablet (150 mg total) by mouth 2 (two) times daily. Patient not taking: Reported on 08/09/2018 07/26/18   McVey, Madelaine BhatElizabeth Whitney, PA-C     Vital Signs: BP (!) 99/59 (BP Location: Right Arm)   Pulse 70   Temp 98 F (36.7 C) (Oral)   Resp 18   Ht 5\' 4"  (1.626 m)   Wt 142 lb 6.7 oz (64.6 kg)   LMP 08/07/2018 (Exact Date)   SpO2 100%   BMI 24.45 kg/m   Physical Exam left pelvic and left transgluteal drains intact, insertion sites okay, output minimal, sites mildly tender to palpation  Imaging: Ct Abdomen Pelvis W Contrast  Result Date: 08/15/2018 CLINICAL DATA:  Status  post abscess drainage. EXAM: CT ABDOMEN AND PELVIS WITH CONTRAST TECHNIQUE: Multidetector CT imaging of the abdomen and pelvis was performed using the standard protocol following bolus administration of intravenous contrast. CONTRAST:  100mL ISOVUE-300 IOPAMIDOL (ISOVUE-300) INJECTION 61% COMPARISON:  CT scans of August 10, 2018 and August 09, 2018. FINDINGS: Lower chest: No acute abnormality. Hepatobiliary: No focal liver abnormality is seen. No gallstones, gallbladder wall thickening, or biliary dilatation. Pancreas: Unremarkable. No pancreatic ductal dilatation or surrounding inflammatory changes. Spleen: Normal in size without focal abnormality. Adrenals/Urinary Tract: Adrenal glands appear normal. Left renal cyst is noted. No hydronephrosis or renal obstruction is noted. Urinary bladder is unremarkable. Stomach/Bowel: The stomach appears normal. There is no evidence of bowel obstruction. Wall thickening of sigmoid colon is noted which may represent secondary inflammation. The proximal portion of the appendix appears normal with appendicoliths and thickening seen distally with adjacent fluid collection suggesting ruptured appendicitis. This fluid collection measures 5.7 x 3.7 cm in size. This is not significantly changed compared to prior exam. Vascular/Lymphatic: No significant vascular findings are present. No enlarged abdominal or pelvic lymph nodes. Reproductive: Uterus and bilateral adnexa are unremarkable. Other: 2 other large fluid collections noted in pelvis on prior exam are completely decompressed status post drainage catheter placement. Musculoskeletal: No acute or significant osseous findings. IMPRESSION: Status post percutaneous catheter placement into 2 fluid collections in the pelvis, 1 anteriorly and the other posteriorly. These collections appear to be completely decompressed  at this time. However, persistent irregular shaped abscess measuring 5.7 x 3.7 cm is noted centrally in the pelvis  which most likely is secondary to ruptured appendicitis. Electronically Signed   By: Lupita Raider, M.D.   On: 08/15/2018 15:48    Labs:  CBC: Recent Labs    08/13/18 0413 08/14/18 0530 08/15/18 0435 08/16/18 0508  WBC 8.2 7.8 7.5 8.3  HGB 10.5* 10.7* 11.4* 10.6*  HCT 32.5* 34.0* 35.9* 33.5*  PLT 505* 573* 609* 647*    COAGS: Recent Labs    08/10/18 0639  INR 1.11    BMP: Recent Labs    08/13/18 0413 08/14/18 0530 08/15/18 0435 08/16/18 0508  NA 137 137 137 136  K 4.7 4.2 5.0 4.5  CL 102 99 96* 100  CO2 28 28 31 31   GLUCOSE 116* 103* 101* 110*  BUN <5* 6 8 7   CALCIUM 8.7* 8.6* 9.0 8.6*  CREATININE 0.64 0.71 0.70 0.64  GFRNONAA >60 >60 >60 >60  GFRAA >60 >60 >60 >60    LIVER FUNCTION TESTS: Recent Labs    08/11/18 0347 08/12/18 0351 08/13/18 0413 08/14/18 0530  BILITOT 0.8 0.5 0.3 0.3  AST 12* 10* 13* 26  ALT 26 22 18 20   ALKPHOS 93 92 91 80  PROT 5.3* 5.3* 6.1* 5.9*  ALBUMIN 1.9* 2.0* 2.2* 2.3*    Assessment and Plan: Pt with hx perforated appendicitis/abd-pelvic abscesses, s/p perc drains 9/13; afebrile; last WBC 9/19  nl; hgb stable; creat nl; fluid cx- e coli/viridans strept/bacteroides; f/u CT 9/18 revealed:  Status post percutaneous catheter placement into 2 fluid collections in the pelvis, 1 anteriorly and the other posteriorly. These collections appear to be completely decompressed at this time. However, persistent irregular shaped abscess measuring 5.7 x 3.7 cm is noted centrally in the pelvis which most likely is secondary to ruptured appendicitis Images reviewed by Dr. Fredia Sorrow and central collection is not amenable to perc drainage at this time; cont IV antbx; with minimal output from existing drains both were removed in their entirety without immediate complications (following discussion with Dr. Fredia Sorrow.) Further plans as per surgical team.   Electronically Signed: D. Jeananne Rama, PA-C 08/18/2018, 12:55 PM   I spent a total  of 20 minutes at the the patient's bedside AND on the patient's hospital floor or unit, greater than 50% of which was counseling/coordinating care for abdominal/pelvic abscess drains    Patient ID: Sierra Hensley, female   DOB: 04-08-1971, 47 y.o.   MRN: [id number]

## 2018-08-19 MED ORDER — SODIUM CHLORIDE 0.9 % IV SOLN
1.0000 g | Freq: Three times a day (TID) | INTRAVENOUS | Status: DC
  Administered 2018-08-19 – 2018-08-21 (×6): 1 g via INTRAVENOUS
  Filled 2018-08-19 (×7): qty 1

## 2018-08-19 NOTE — Progress Notes (Deleted)
[facility] Progress Note     Subjective: CC: perforated appendicitis Denies abdominal pain, n/v. Having bowel function and tolerating diet. IR drains removed yesterday.   Objective: Vital signs in last 24 hours: Temp:  [98 F (36.7 C)-98.7 F (37.1 C)] 98.2 F (36.8 C) (09/22 0445) Pulse Rate:  [65-75] 65 (09/22 0445) Resp:  [16-18] 16 (09/22 0445) BP: (91-113)/(61-71) 113/63 (09/22 0445) SpO2:  [100 %] 100 % (09/22 0445) Last BM Date: 08/17/18  Intake/Output from previous day: 09/21 0701 - 09/22 0700 In: 2336.7 [P.O.:960; IV Piggyback:1376.7] Out: -  Intake/Output this shift: No intake/output data recorded.  PE: Gen:  Alert, NAD, pleasant Card:  Regular rate and rhythm, pedal pulses 2+ BL Pulm:  Normal effort, clear to auscultation bilaterally Abd: Soft, non-tender, non-distended, bowel sounds present Skin: warm and dry, no rashes  Psych: A&Ox3   Lab Results:  No results for input(s): WBC, HGB, HCT, PLT in the last 72 hours. BMET No results for input(s): NA, K, CL, CO2, GLUCOSE, BUN, CREATININE, CALCIUM in the last 72 hours. PT/INR No results for input(s): LABPROT, INR in the last 72 hours. CMP     Component Value Date/Time   NA 136 08/16/2018 0508   NA 138 05/15/2018 0954   K 4.5 08/16/2018 0508   CL 100 08/16/2018 0508   CO2 31 08/16/2018 0508   GLUCOSE 110 (H) 08/16/2018 0508   BUN 7 08/16/2018 0508   BUN 10 05/15/2018 0954   CREATININE 0.64 08/16/2018 0508   CALCIUM 8.6 (L) 08/16/2018 0508   PROT 5.9 (L) 08/14/2018 0530   PROT 7.2 05/15/2018 0954   ALBUMIN 2.3 (L) 08/14/2018 0530   ALBUMIN 4.1 05/15/2018 0954   AST 26 08/14/2018 0530   ALT 20 08/14/2018 0530   ALKPHOS 80 08/14/2018 0530   BILITOT 0.3 08/14/2018 0530   BILITOT 0.4 05/15/2018 0954   GFRNONAA >60 08/16/2018 0508   GFRAA >60 08/16/2018 0508   Lipase     Component Value Date/Time   LIPASE 55 (H) 08/09/2018 1024       Studies/Results: No results  found.  Anti-infectives: Anti-infectives (From admission, onward)   Start     Dose/Rate Route Frequency Ordered Stop   08/19/18 1400  meropenem (MERREM) 1 g in sodium chloride 0.9 % 100 mL IVPB     1 g 200 mL/hr over 30 Minutes Intravenous Every 8 hours 08/19/18 0754     08/12/18 1800  meropenem (MERREM) 1 g in sodium chloride 0.9 % 100 mL IVPB  Status:  Discontinued     1 g 200 mL/hr over 30 Minutes Intravenous Every 12 hours 08/12/18 1637 08/19/18 0754   08/10/18 1000  cefTRIAXone (ROCEPHIN) 2 g in sodium chloride 0.9 % 100 mL IVPB  Status:  Discontinued     2 g 200 mL/hr over 30 Minutes Intravenous Every 24 hours 08/09/18 2230 08/12/18 1637   08/10/18 0600  metroNIDAZOLE (FLAGYL) IVPB 500 mg  Status:  Discontinued     500 mg 100 mL/hr over 60 Minutes Intravenous Every 8 hours 08/09/18 2230 08/12/18 1637   08/09/18 2030  cefTRIAXone (ROCEPHIN) 1 g in sodium chloride 0.9 % 100 mL IVPB     1 g 200 mL/hr over 30 Minutes Intravenous  Once 08/09/18 2024 08/09/18 2119   08/09/18 2030  metroNIDAZOLE (FLAGYL) IVPB 500 mg     500 mg 100 mL/hr over 60 Minutes Intravenous  Once 08/09/18 2024 08/09/18 2236       Assessment/Plan History of  C-section 12/09/2014   Perforated appendicitis with multiple abscesses - IRdrain placementx29/13/2019 - E Coli from the drain fluid - CT 9/18 showed 2 fluid collections s/p drain that are completely decompressed, and persistent abscess 5/7x3/7cm that cannot be drained - IR drains removed 9/21  Diarrhea- GI panelnegative   ID -rocephin/flagyl 9/12>>9/15; Meropenem 9/15>>day8/10 FEN -KVO IVF, soft diet VTE -SCDs, lovenox Foley -none  Plan:Continue IV abx. Will need a total of 10 days IV abx because her cultures were not sensitive to any oral abx. Continue ambulating. Outpatient CT 10-14 days from discharge   LOS: 10 days    Wells GuilesKelly Rayburn , [facility] 08/19/2018, 8:48 AM Pager: [phone] Consults:  [phone] Mon-Fri 7:00 am-4:30 pm Sat-Sun 7:00 am-11:30 am

## 2018-08-19 NOTE — Plan of Care (Signed)
  Problem: Activity: Goal: Risk for activity intolerance will decrease Outcome: Progressing   Problem: Nutrition: Goal: Adequate nutrition will be maintained Outcome: Progressing   Problem: Coping: Goal: Level of anxiety will decrease Outcome: Progressing   Problem: Elimination: Goal: Will not experience complications related to bowel motility Outcome: Progressing   Problem: Safety: Goal: Ability to remain free from injury will improve Outcome: Progressing   

## 2018-08-19 NOTE — Progress Notes (Signed)
   Subjective/Chief Complaint: No complaints. Drains were removed yesterday and she did well   Objective: Vital signs in last 24 hours: Temp:  [98 F (36.7 C)-98.7 F (37.1 C)] 98.2 F (36.8 C) (09/22 0445) Pulse Rate:  [65-75] 65 (09/22 0445) Resp:  [16-18] 16 (09/22 0445) BP: (91-113)/(61-71) 113/63 (09/22 0445) SpO2:  [100 %] 100 % (09/22 0445) Last BM Date: 08/17/18  Intake/Output from previous day: 09/21 0701 - 09/22 0700 In: 2336.7 [P.O.:960; IV Piggyback:1376.7] Out: -  Intake/Output this shift: No intake/output data recorded.  General appearance: alert and cooperative Resp: clear to auscultation bilaterally Cardio: regular rate and rhythm GI: soft, nontender  Lab Results:  No results for input(s): WBC, HGB, HCT, PLT in the last 72 hours. BMET No results for input(s): NA, K, CL, CO2, GLUCOSE, BUN, CREATININE, CALCIUM in the last 72 hours. PT/INR No results for input(s): LABPROT, INR in the last 72 hours. ABG No results for input(s): PHART, HCO3 in the last 72 hours.  Invalid input(s): PCO2, PO2  Studies/Results: No results found.  Anti-infectives: Anti-infectives (From admission, onward)   Start     Dose/Rate Route Frequency Ordered Stop   08/19/18 1400  meropenem (MERREM) 1 g in sodium chloride 0.9 % 100 mL IVPB     1 g 200 mL/hr over 30 Minutes Intravenous Every 8 hours 08/19/18 0754     08/12/18 1800  meropenem (MERREM) 1 g in sodium chloride 0.9 % 100 mL IVPB  Status:  Discontinued     1 g 200 mL/hr over 30 Minutes Intravenous Every 12 hours 08/12/18 1637 08/19/18 0754   08/10/18 1000  cefTRIAXone (ROCEPHIN) 2 g in sodium chloride 0.9 % 100 mL IVPB  Status:  Discontinued     2 g 200 mL/hr over 30 Minutes Intravenous Every 24 hours 08/09/18 2230 08/12/18 1637   08/10/18 0600  metroNIDAZOLE (FLAGYL) IVPB 500 mg  Status:  Discontinued     500 mg 100 mL/hr over 60 Minutes Intravenous Every 8 hours 08/09/18 2230 08/12/18 1637   08/09/18 2030   cefTRIAXone (ROCEPHIN) 1 g in sodium chloride 0.9 % 100 mL IVPB     1 g 200 mL/hr over 30 Minutes Intravenous  Once 08/09/18 2024 08/09/18 2119   08/09/18 2030  metroNIDAZOLE (FLAGYL) IVPB 500 mg     500 mg 100 mL/hr over 60 Minutes Intravenous  Once 08/09/18 2024 08/09/18 2236      Assessment/Plan: s/p * No surgery found * Advance diet  Continue IV abx for a couple more days. Still has pelvic fluid collection. This may need to be reimaged by CT in another week or two Will need internal appendectomy in 6-8 weeks  LOS: 10 days    TOTH III,PAUL S 08/19/2018

## 2018-08-20 NOTE — Progress Notes (Signed)
Pt started complaining of itching RN gave the patient bendryl that was ordered for itching. Will continue to monitor

## 2018-08-20 NOTE — Progress Notes (Signed)
Nutrition Follow-up  DOCUMENTATION CODES:   Not applicable  INTERVENTION:    Ensure Enlive po BID, each supplement provides 350 kcal and 20 grams of protein  NUTRITION DIAGNOSIS:   Increased nutrient needs related to acute illness(perforated appendicitis with multiple abscesses) as evidenced by estimated needs   Ongoing  GOAL:   Patient will meet greater than or equal to 90% of their needs   Progressing  MONITOR:   Diet advancement, PO intake, Supplement acceptance, Labs, Skin, Weight trends, I & O's  ASSESSMENT:   47 y.o. Female to ED with complaint of 1 week of crampy low abdominal pain and multiple episodes of diarrhea.   Pt admitted with Acute appendicitis with perforation and peritoneal abscess [K35.33].  Patient walking around the unit. Spoke with RN. Husband has been bringing outside food. Drinking her Ensure Enlive nutrition supplements. Labs & medications reviewed. D/C home tomorrow.  Diet Order:   Diet Order            DIET SOFT Room service appropriate? Yes; Fluid consistency: Thin  Diet effective now             EDUCATION NEEDS:   Not appropriate for education at this time  Skin:  Skin Assessment: Reviewed RN Assessment  Last BM:  9/20   Height:   Ht Readings from Last 1 Encounters:  08/10/18 5\' 4"  (1.626 m)   Weight:   Wt Readings from Last 1 Encounters:  08/10/18 64.6 kg   BMI:  Body mass index is 24.45 kg/m.  Estimated Nutritional Needs:   Kcal:  1700-1900  Protein:  80-95 gm  Fluid:  1.7-1.9 L  Maureen ChattersKatie Kristiann Noyce, RD, LDN Pager #: [phone] After-Hours Pager #: [phone]

## 2018-08-20 NOTE — Progress Notes (Signed)
[facility] Progress Note     Subjective: CC-  Feeling ok today. Appetite slowly improving. States that she had a normal/solid BM last night, no diarrhea. Abdominal pain improving. Ambulating around the room without any issues.  Lives at home with husband and brother Works in a [employer]  Objective: Vital signs in last 24 hours: Temp:  [98 F (36.7 C)-98.1 F (36.7 C)] 98 F (36.7 C) (09/23 0425) Pulse Rate:  [79-103] 97 (09/23 0425) Resp:  [14-16] 15 (09/23 0425) BP: (96-103)/(62-71) 100/65 (09/23 0425) SpO2:  [97 %-100 %] 97 % (09/23 0425) Last BM Date: 08/17/18  Intake/Output from previous day: 09/22 0701 - 09/23 0700 In: 2456.7 [P.O.:960; IV Piggyback:1496.7] Out: -  Intake/Output this shift: No intake/output data recorded.  PE: Gen: Alert, NAD, pleasant HEENT: EOM's intact, pupils equal and round Pulm: effort normal Abd: Soft,ND, NT, +BS,previous drain sites with cdi dressings in place NWG:NFAOZHExt:Calves soft and nontender Psych: A&Ox3  Skin: no rashes noted, warm and dry   Lab Results:  No results for input(s): WBC, HGB, HCT, PLT in the last 72 hours. BMET No results for input(s): NA, K, CL, CO2, GLUCOSE, BUN, CREATININE, CALCIUM in the last 72 hours. PT/INR No results for input(s): LABPROT, INR in the last 72 hours. CMP     Component Value Date/Time   NA 136 08/16/2018 0508   NA 138 05/15/2018 0954   K 4.5 08/16/2018 0508   CL 100 08/16/2018 0508   CO2 31 08/16/2018 0508   GLUCOSE 110 (H) 08/16/2018 0508   BUN 7 08/16/2018 0508   BUN 10 05/15/2018 0954   CREATININE 0.64 08/16/2018 0508   CALCIUM 8.6 (L) 08/16/2018 0508   PROT 5.9 (L) 08/14/2018 0530   PROT 7.2 05/15/2018 0954   ALBUMIN 2.3 (L) 08/14/2018 0530   ALBUMIN 4.1 05/15/2018 0954   AST 26 08/14/2018 0530   ALT 20 08/14/2018 0530   ALKPHOS 80 08/14/2018 0530   BILITOT 0.3 08/14/2018 0530   BILITOT 0.4 05/15/2018 0954   GFRNONAA >60 08/16/2018 0508   GFRAA >60 08/16/2018  0508   Lipase     Component Value Date/Time   LIPASE 55 (H) 08/09/2018 1024       Studies/Results: No results found.  Anti-infectives: Anti-infectives (From admission, onward)   Start     Dose/Rate Route Frequency Ordered Stop   08/19/18 1400  meropenem (MERREM) 1 g in sodium chloride 0.9 % 100 mL IVPB     1 g 200 mL/hr over 30 Minutes Intravenous Every 8 hours 08/19/18 0754     08/12/18 1800  meropenem (MERREM) 1 g in sodium chloride 0.9 % 100 mL IVPB  Status:  Discontinued     1 g 200 mL/hr over 30 Minutes Intravenous Every 12 hours 08/12/18 1637 08/19/18 0754   08/10/18 1000  cefTRIAXone (ROCEPHIN) 2 g in sodium chloride 0.9 % 100 mL IVPB  Status:  Discontinued     2 g 200 mL/hr over 30 Minutes Intravenous Every 24 hours 08/09/18 2230 08/12/18 1637   08/10/18 0600  metroNIDAZOLE (FLAGYL) IVPB 500 mg  Status:  Discontinued     500 mg 100 mL/hr over 60 Minutes Intravenous Every 8 hours 08/09/18 2230 08/12/18 1637   08/09/18 2030  cefTRIAXone (ROCEPHIN) 1 g in sodium chloride 0.9 % 100 mL IVPB     1 g 200 mL/hr over 30 Minutes Intravenous  Once 08/09/18 2024 08/09/18 2119   08/09/18 2030  metroNIDAZOLE (FLAGYL) IVPB 500 mg  500 mg 100 mL/hr over 60 Minutes Intravenous  Once 08/09/18 2024 08/09/18 2236       Assessment/Plan History of C-section 12/09/2014   Perforated appendicitis with multiple abscesses - IRdrain placementx29/13/2019, drains removed 9/21 - E Coli from the drain fluid - CT 9/18 showed 2 fluid collections s/p drain that are completely decompressed, and persistent abscess 5/7x3/7cm that cannot be drainaged  Diarrhea- GI panelnegative, improved  ID -rocephin/flagyl 9/12>>9/125; Meropenem 9/15>>day9/10 FEN -KVO IVF, soft diet VTE -SCDs, lovenox Foley -none  Plan:Continue IV abx one more day. Plan for discharge tomorrow with outpatient CT scan in about 1 week. May need interval appendectomy in 6-8 weeks.   LOS: 11 days     Franne Forts , [facility] 08/20/2018, 8:47 AM Pager: [phone] Consults: [phone] Mon 7:00 am -11:30 AM Tues-Fri 7:00 am-4:30 pm Sat-Sun 7:00 am-11:30 am

## 2018-08-20 NOTE — Progress Notes (Signed)
PHARMACY CONSULT NOTE FOR:  OUTPATIENT  PARENTERAL ANTIBIOTIC THERAPY (OPAT)  Indication: ESBL EColi Regimen: Meropenem 1 g q8h End date: 08/23/18  IV antibiotic discharge orders are pended. To discharging provider:  please sign these orders via discharge navigator,  Select New Orders & click on the button choice - Manage This Unsigned Work.    Sierra BlissMichael Gracin Hensley, PharmD, BCPS, BCCCP Clinical Pharmacist [phone]  Please check AMION for all [facility] numbers  08/20/2018 1:09 PM

## 2018-08-21 MED ORDER — ENSURE ENLIVE PO LIQD: 237.0000 mL | Freq: Two times a day (BID) | ORAL | 12 refills | Status: DC

## 2018-08-21 MED ORDER — SACCHAROMYCES BOULARDII 250 MG PO CAPS: 250.0000 mg | ORAL_CAPSULE | Freq: Two times a day (BID) | ORAL | Status: DC

## 2018-08-21 NOTE — Plan of Care (Signed)
  Problem: Activity: Goal: Risk for activity intolerance will decrease Outcome: Progressing   

## 2018-08-21 NOTE — Discharge Summary (Signed)
[facility] Discharge Summary   Patient ID: Sierra Hensley MRN: [id number] DOB/AGE: 46/05/1971 46 y.o.  Admit date: 08/09/2018 Discharge date: 08/21/2018  Admitting Diagnosis: Perforated appendicitis with abscesses  Discharge Diagnosis Patient Active Problem List   Diagnosis Date Noted  . Acute appendicitis with perforation and peritoneal abscess 08/09/2018  . Generalized abdominal cramping 08/03/2018  . Diarrhea of presumed infectious origin 08/03/2018  . History of gestational diabetes 05/15/2018  . Gastroesophageal reflux disease 05/15/2018    Consultants Interventional radiology  Imaging: No results found.  Procedures Dr. Loreta AveWagner (08/10/18) - Placement of left anterior 46F drain into pelvic fluid.  Placement of left transgluteal drain into pelvic fluid.    Hospital Course:  Sierra Hensley is a 47yo female who [facility] with 1 weeks of abdominal pain, nausea, vomiting, and diarrhea.  Workup included a CT scan which showed perforated appendicitis with large abscess in abdomen and pelvis.  Patient was admitted and started on IV rocephin/flagyl. Interventional radiology was consulted and placed 2 drains for intraabdominal abscesses, but did not have a good window for the third large abscess.  Cultures from the drain fluid grew E coli that was multidrug resistant. Antibiotics were switched to meropenem 9/15. Repeat CT scan 9/18 showed resolution of the two drained abscesses, therefore the drains were removed. It also showed persistent pelvic abscess. Patient's abdominal pain resolved and diet was advanced as tolerated. She remained admitted to complete a 10 day course of IV meropenem. On 9/24 the patient was voiding well, tolerating diet, having bowel function, ambulating well, pain well controlled, vital signs stable and felt stable for discharge home.  Patient will have a repeat CT scan in about 1 week and follow up as scheduled in our office. She knows to call with  questions or concerns.     Physical Exam: Gen: Alert, NAD, pleasant HEENT: EOM's intact, pupils equal and round Pulm: effort normal Abd: Soft,ND,NT, +BS,previous drain sites with cdi dressings in place NWG:NFAOZHExt:Calves soft and nontender Psych: A&Ox3  Skin: no rashes noted, warm and dry   Allergies as of 08/21/2018   No Known Allergies     Medication List    STOP taking these medications   doxycycline 100 MG capsule Commonly known as:  VIBRAMYCIN   fluticasone 50 MCG/ACT nasal spray Commonly known as:  FLONASE   meloxicam 7.5 MG tablet Commonly known as:  MOBIC   predniSONE 20 MG tablet Commonly known as:  DELTASONE   ranitidine 150 MG tablet Commonly known as:  ZANTAC     TAKE these medications   acetaminophen 325 MG tablet Commonly known as:  TYLENOL Take 650 mg by mouth every 6 (six) hours as needed (for pain or fever).   diphenoxylate-atropine 2.5-0.025 MG tablet Commonly known as:  LOMOTIL Take 2 tablets by mouth 3 (three) times daily as needed for diarrhea or loose stools.   feeding supplement (ENSURE ENLIVE) Liqd Take 237 mLs by mouth 2 (two) times daily between meals.   ondansetron 4 MG tablet Commonly known as:  ZOFRAN Take 4 mg by mouth every 8 (eight) hours as needed for nausea or vomiting.   saccharomyces boulardii 250 MG capsule Commonly known as:  FLORASTOR Take 1 capsule (250 mg total) by mouth 2 (two) times daily. You can get a probiotic over the counter        Follow-up Information    Kinsinger, De BlanchLuke Aaron, MD. Call in 1 week(s).   Specialty:  General Surgery Why:  We  are working on your appointment, please call to confirm. We will schedule you for a CT scan and then a follow up appointment with Dr. Sheliah Hatch. Contact information: [address] [place] [address] [phone]           Signed: Franne Forts, [facility] 08/21/2018, 8:06 AM Pager: [phone] Consults: [phone] Mon  7:00 am -11:30 AM Tues-Fri 7:00 am-4:30 pm Sat-Sun 7:00 am-11:30 am

## 2018-08-21 NOTE — Discharge Instructions (Signed)
If you start having increased abdominal pain, nausea, vomiting, fevers... Call our office. Otherwise follow up for CT scan and appointment with surgeon as scheduled.   Vim ru?t th?a (Appendicitis) Ru?t th?a l m?t ?ng hnh ngn tay g?n v?i ru?t gi. Vim ru?t th?a l vim ph?n ru?t th?a ?. N?u khng ???c ?i?u tr?, vim ru?t th?a c th? lm cho ru?t th?a b? rch (v?). Ru?t th?a b? v? c th? d?n ??n nhi?m trng ?e d?a tnh m?ng. B?nh c?ng d?n ??n tnh tr?ng m?ng m? gy ?au (p xe) trong ru?t th?a. NGUYN NHN Tnh tr?ng ny c th? do t?c ru?t th?a d?n ??n nhi?m trng. T?c ru?t c th? do:  M?t c?c phn.  Cc h?ch b?ch huy?t to ra. Trong m?t s? tr??ng h?p, khng r nguyn nhn. CC Y?U T? NGUY C? Tnh tr?ng ny hay x?y ra h?n ? nh?ng ng??i t? 10-30 tu?i: TRI?U CH?NG Nh?ng tri?u ch?ng c?a tnh tr?ng ny bao g?m:  ?au quanh r?n v lan sang ph?n b?ng d??i bn ph?i. C?n ?au c th? cng lc cng n?ng h?n. C?n ?au n?ng h?n khi ho ho?c c? ??ng b?t ng?.  Nh?y c?m ?au ? vng b?ng d??i bn ph?i.  Bu?n nn.  Nn.  ?n khng ngon mi?ng.  S?t.  To bn.  Tiu ch?y.  Nhn chung c?m th?y khng kh?e. CH?N ?ON Tnh tr?ng ny c th? ???c ch?n ?on b?ng:  Khm th?c th?.  Xt nghi?m mu.  Xt nghi?m n??c ti?u. ?? xc ??nh ch?n ?on c th? c?n siu m, ch?p MRI ho?c ch?p CT. ?I?U TR? Tnh tr?ng ny th??ng ???c ?i?u tr? b?ng cch c?t b? ru?t th?a (c?t ru?t th?a). C hai ph??ng php c?t b? ru?t th?a:  C?t b? ru?t th?a m? phanh. Trong ph?u thu?t ny, ru?t th?a ???c c?t b? thng qua m?t ???ng r?ch l?n (v?t m?) ? ph?n b?ng d??i bn ph?i. Th? thu?t ny c th? ???c khuy?n ngh? n?u: ? Qu v? c v?t s?o l?n c?a l?n ph?u thu?t tr??c ?y. ? Qu v? c b?nh ch?y mu. ? Qu v? c thai v s?p sinh. ? Qu v? c m?t tnh tr?ng b?nh l d?n ??n khng th? n?i soi ???c, ch?ng h?n nh? nhi?m trng ti?n xa ho?c ru?t th?a b? v?.  C?t ru?t th?a n?i soi. Trong lo?i ph?u thu?t ny, ru?t th?a ???c c?t b? thng qua  cc v?t m? nh?. Th? thu?t ny th??ng gy ?au t h?n v gy ra t v?n ?? h?n so v?i c?t ru?t th?a m? phanh. N c?ng c th?i gian h?i ph?c ng?n h?n. N?u ru?t th?a ? b? v? v ? hnh thnh p xe, c th? ??t m?t ?ng d?n l?u vo ch? p xe ?? l?y d?ch ra v c th? cho dng thu?c khng sinh qua ???ng ?ng truy?n t?nh m?ch (IV). C th? c?n ho?c khng c?n c?t b? ru?t th?a. Thng tin ny khng nh?m m?c ?ch thay th? cho l?i khuyn m chuyn gia ch?m [place] s?c kh?e ni v?i qu v?. Hy b?o ??m qu v? ph?i th?o lu?n b?t k? v?n ?? g m qu v? c v?i chuyn gia ch?m [place] s?c kh?e c?a qu v?. Document Released: 11/14/2005 Document Revised: 03/07/2016 Document Reviewed: 04/01/2015 Elsevier Interactive Patient Education  2017 [employer]

## 2018-08-21 NOTE — Progress Notes (Signed)
Utilzed tele intepretor to give written and verbal discharge instructions. Interpretor number [id number].

## 2018-08-22 ENCOUNTER — Other Ambulatory Visit: Payer: Self-pay | Admitting: General Surgery

## 2018-08-22 DIAGNOSIS — K3532 Acute appendicitis with perforation and localized peritonitis, without abscess: Secondary | ICD-10-CM

## 2018-08-27 ENCOUNTER — Ambulatory Visit
Admission: RE | Admit: 2018-08-27 | Discharge: 2018-08-27 | Disposition: A | Discharge status: Home / Self Care | Payer: BLUE CROSS/BLUE SHIELD | Source: Ambulatory Visit | Attending: General Surgery | Admitting: General Surgery

## 2018-08-27 DIAGNOSIS — K3532 Acute appendicitis with perforation and localized peritonitis, without abscess: Secondary | ICD-10-CM

## 2018-08-27 MED ORDER — IOPAMIDOL (ISOVUE-300) INJECTION 61%
100.0000 mL | Freq: Once | INTRAVENOUS | Status: AC | PRN
  Administered 2018-08-27: 100 mL via INTRAVENOUS

## 2018-09-03 ENCOUNTER — Ambulatory Visit: Payer: Self-pay | Admitting: General Surgery

## 2018-09-21 ENCOUNTER — Other Ambulatory Visit: Payer: Self-pay | Admitting: General Surgery

## 2018-09-21 ENCOUNTER — Ambulatory Visit
Admission: RE | Admit: 2018-09-21 | Discharge: 2018-09-21 | Disposition: A | Discharge status: Home / Self Care | Payer: BLUE CROSS/BLUE SHIELD | Source: Ambulatory Visit | Attending: General Surgery | Admitting: General Surgery

## 2018-09-21 DIAGNOSIS — K3532 Acute appendicitis with perforation and localized peritonitis, without abscess: Secondary | ICD-10-CM

## 2018-09-21 MED ORDER — IOPAMIDOL (ISOVUE-300) INJECTION 61%
100.0000 mL | Freq: Once | INTRAVENOUS | Status: AC | PRN
  Administered 2018-09-21: 100 mL via INTRAVENOUS

## 2018-09-25 NOTE — Pre-Procedure Instructions (Signed)
Sierra Hensley  09/25/2018      [id number] [place], [place] - [address] AT [facility] [address] [place] [address] Phone: [phone] Fax: [phone]    Your procedure is scheduled on 10/03/2018.  Report to [facility] [employer] Admitting at 0800 A.M.  Call this number if you have problems the morning of surgery:  [phone]   Remember:  Do not eat or drink after midnight.  You may drink clear liquids until 0700 .  Clear liquids allowed are: Water, Juice (non-citric and without pulp), Carbonated beverages, Clear Tea, Black Coffee only and Gatorade    Take these medicines the morning of surgery with A SIP OF WATER:  NONE  7 days prior to surgery STOP taking any Aspirin (unless otherwise instructed by your surgeon), Aleve, Naproxen, Ibuprofen, Motrin, Advil, Goody's, BC's, all herbal medications, fish oil, and all vitamins     Do not wear jewelry, make-up or nail polish.  Do not wear lotions, powders, or perfumes, or deodorant.  Do not shave 48 hours prior to surgery.    Do not bring valuables to the hospital.  [facility] is not responsible for any belongings or valuables.  Contacts, eyeglasses, hearing aids, dentures or bridgework may not be worn into surgery.  Leave your suitcase in the car.  After surgery it may be brought to your room.  For patients admitted to the hospital, discharge time will be determined by your treatment team.  Patients discharged the day of surgery will not be allowed to drive home.   Name and phone number of your driver:    Special instructions:   [facility]- Preparing For Surgery  Before surgery, you can play an important role. Because skin is not sterile, your skin needs to be as free of germs as possible. You can reduce the number of germs on your skin by washing with CHG (chlorahexidine gluconate) Soap before surgery.  CHG is an antiseptic cleaner which kills germs and  bonds with the skin to continue killing germs even after washing.    Oral Hygiene is also important to reduce your risk of infection.  Remember - BRUSH YOUR TEETH THE MORNING OF SURGERY WITH YOUR REGULAR TOOTHPASTE  Please do not use if you have an allergy to CHG or antibacterial soaps. If your skin becomes reddened/irritated stop using the CHG.  Do not shave (including legs and underarms) for at least 48 hours prior to first CHG shower. It is OK to shave your face.  Please follow these instructions carefully.   1. Shower the NIGHT BEFORE SURGERY and the MORNING OF SURGERY with CHG.   2. If you chose to wash your hair, wash your hair first as usual with your normal shampoo.  3. After you shampoo, rinse your hair and body thoroughly to remove the shampoo.  4. Use CHG as you would any other liquid soap. You can apply CHG directly to the skin and wash gently with a scrungie or a clean washcloth.   5. Apply the CHG Soap to your body ONLY FROM THE NECK DOWN.  Do not use on open wounds or open sores. Avoid contact with your eyes, ears, mouth and genitals (private parts). Wash Face and genitals (private parts)  with your normal soap.  6. Wash thoroughly, paying special attention to the area where your surgery will be performed.  7. Thoroughly rinse your body with warm  water from the neck down.  8. DO NOT shower/wash with your normal soap after using and rinsing off the CHG Soap.  9. Pat yourself dry with a CLEAN TOWEL.  10. Wear CLEAN PAJAMAS to bed the night before surgery, wear comfortable clothes the morning of surgery  11. Place CLEAN SHEETS on your bed the night of your first shower and DO NOT SLEEP WITH PETS.    Day of Surgery: Shower as stated above. Do not apply any deodorants/lotions.  Please wear clean clothes to the hospital/surgery center.   Remember to brush your teeth WITH YOUR REGULAR TOOTHPASTE.    Please read over the following fact sheets that you were given. Pain  Booklet, Coughing and Deep Breathing and Surgical Site Infection Prevention

## 2018-09-26 ENCOUNTER — Other Ambulatory Visit: Payer: Self-pay

## 2018-09-26 ENCOUNTER — Encounter (HOSPITAL_COMMUNITY)
Admission: RE | Admit: 2018-09-26 | Discharge: 2018-09-26 | Disposition: A | Discharge status: Home / Self Care | Payer: BLUE CROSS/BLUE SHIELD | Source: Ambulatory Visit | Attending: General Surgery | Admitting: General Surgery

## 2018-09-26 ENCOUNTER — Encounter (HOSPITAL_COMMUNITY): Payer: Self-pay

## 2018-09-26 DIAGNOSIS — Z01812 Encounter for preprocedural laboratory examination: Secondary | ICD-10-CM | POA: Insufficient documentation

## 2018-09-26 LAB — CBC
HEMATOCRIT: 40 % (ref 36.0–46.0)
HEMOGLOBIN: 12.5 g/dL (ref 12.0–15.0)
MCH: 27.8 pg (ref 26.0–34.0)
MCHC: 31.3 g/dL (ref 30.0–36.0)
MCV: 88.9 fL (ref 80.0–100.0)
Platelets: 253 10*3/uL (ref 150–400)
RBC: 4.5 MIL/uL (ref 3.87–5.11)
RDW: 13.5 % (ref 11.5–15.5)
WBC: 5.6 10*3/uL (ref 4.0–10.5)
nRBC: 0 % (ref 0.0–0.2)

## 2018-09-26 LAB — BASIC METABOLIC PANEL
ANION GAP: 8 (ref 5–15)
BUN: 11 mg/dL (ref 6–20)
CHLORIDE: 105 mmol/L (ref 98–111)
CO2: 25 mmol/L (ref 22–32)
Calcium: 9.6 mg/dL (ref 8.9–10.3)
Creatinine, Ser: 0.75 mg/dL (ref 0.44–1.00)
GFR calc Af Amer: >60 mL/min (ref >60)
GFR calc non Af Amer: >60 mL/min (ref >60)
Glucose, Bld: 98 mg/dL (ref 70–99)
POTASSIUM: 4 mmol/L (ref 3.5–5.1)
Sodium: 138 mmol/L (ref 135–145)

## 2018-09-26 NOTE — Progress Notes (Signed)
PCP - Dr. Kennedy Bucker  Cardiologist - Denies  Chest x-ray - Denies  EKG - Denies  Stress Test - Denies  ECHO - Denies  Cardiac Cath - Denies  AICD- na PM-na LOOP- na  Sleep Study - Denies CPAP - None  LABS- 09/26/18: CBC, BMP  POC UPreg- DOS  ASA- Denies  Pt sts that the Ensure drink gives her diarrhea, therefore, she will not take the day of surgery. Also the pt will need a [place] interpreter the day of surgery.  Anesthesia- No  Pt denies having chest pain, sob, or fever at this time. All instructions explained to the pt, with a verbal understanding of the material. Pt agrees to go over the instructions while at home for a better understanding. The opportunity to ask questions was provided.

## 2018-10-02 MED ORDER — SODIUM CHLORIDE 0.9 % IV SOLN
2.0000 g | INTRAVENOUS | Status: AC
  Administered 2018-10-03: 2 g via INTRAVENOUS
  Filled 2018-10-02: qty 2

## 2018-10-03 ENCOUNTER — Ambulatory Visit (HOSPITAL_COMMUNITY): Payer: BLUE CROSS/BLUE SHIELD | Admitting: Certified Registered Nurse Anesthetist

## 2018-10-03 ENCOUNTER — Encounter (HOSPITAL_COMMUNITY): Payer: Self-pay | Admitting: Anesthesiology

## 2018-10-03 ENCOUNTER — Other Ambulatory Visit: Payer: Self-pay

## 2018-10-03 ENCOUNTER — Ambulatory Visit (HOSPITAL_COMMUNITY)
Admission: RE | Admit: 2018-10-03 | Discharge: 2018-10-03 | Disposition: A | Discharge status: Home / Self Care | Payer: BLUE CROSS/BLUE SHIELD | Source: Ambulatory Visit | Attending: General Surgery | Admitting: General Surgery

## 2018-10-03 ENCOUNTER — Encounter (HOSPITAL_COMMUNITY)
Admission: RE | Disposition: A | Discharge status: Home / Self Care | Payer: Self-pay | Source: Ambulatory Visit | Attending: General Surgery

## 2018-10-03 DIAGNOSIS — K388 Other specified diseases of appendix: Secondary | ICD-10-CM | POA: Insufficient documentation

## 2018-10-03 DIAGNOSIS — K3532 Acute appendicitis with perforation and localized peritonitis, without abscess: Secondary | ICD-10-CM | POA: Insufficient documentation

## 2018-10-03 HISTORY — PX: LAPAROSCOPIC APPENDECTOMY: SHX408

## 2018-10-03 LAB — POCT PREGNANCY, URINE: Preg Test, Ur: NEGATIVE

## 2018-10-03 SURGERY — APPENDECTOMY, LAPAROSCOPIC
Anesthesia: General | Site: Abdomen

## 2018-10-03 MED ORDER — BUPIVACAINE-EPINEPHRINE 0.25% -1:200000 IJ SOLN
INTRAMUSCULAR | Status: DC | PRN
  Administered 2018-10-03: 30 mL

## 2018-10-03 MED ORDER — CELECOXIB 200 MG PO CAPS
ORAL_CAPSULE | ORAL | Status: AC
  Administered 2018-10-03: 400 mg via ORAL
  Filled 2018-10-03: qty 2

## 2018-10-03 MED ORDER — METOCLOPRAMIDE HCL 5 MG/ML IJ SOLN
10.0000 mg | Freq: Once | INTRAMUSCULAR | Status: AC | PRN
  Administered 2018-10-03: 10 mg via INTRAVENOUS

## 2018-10-03 MED ORDER — CELECOXIB 200 MG PO CAPS
400.0000 mg | ORAL_CAPSULE | ORAL | Status: AC
  Administered 2018-10-03: 400 mg via ORAL

## 2018-10-03 MED ORDER — GABAPENTIN 300 MG PO CAPS
ORAL_CAPSULE | ORAL | Status: AC
  Administered 2018-10-03: 300 mg via ORAL
  Filled 2018-10-03: qty 1

## 2018-10-03 MED ORDER — FENTANYL CITRATE (PF) 250 MCG/5ML IJ SOLN
INTRAMUSCULAR | Status: AC
  Filled 2018-10-03: qty 5

## 2018-10-03 MED ORDER — GLYCOPYRROLATE 0.2 MG/ML IJ SOLN
INTRAMUSCULAR | Status: DC | PRN
  Administered 2018-10-03 (×2): 0.1 mg via INTRAVENOUS

## 2018-10-03 MED ORDER — LIDOCAINE 2% (20 MG/ML) 5 ML SYRINGE
INTRAMUSCULAR | Status: AC
  Filled 2018-10-03: qty 5

## 2018-10-03 MED ORDER — KETAMINE HCL 50 MG/5ML IJ SOSY
PREFILLED_SYRINGE | INTRAMUSCULAR | Status: AC
  Filled 2018-10-03: qty 5

## 2018-10-03 MED ORDER — SUCCINYLCHOLINE CHLORIDE 200 MG/10ML IV SOSY
PREFILLED_SYRINGE | INTRAVENOUS | Status: DC | PRN
  Administered 2018-10-03: 100 mg via INTRAVENOUS

## 2018-10-03 MED ORDER — ROCURONIUM BROMIDE 50 MG/5ML IV SOSY
PREFILLED_SYRINGE | INTRAVENOUS | Status: AC
  Filled 2018-10-03: qty 10

## 2018-10-03 MED ORDER — ENSURE PRE-SURGERY PO LIQD
296.0000 mL | Freq: Once | ORAL | Status: DC
  Filled 2018-10-03: qty 296

## 2018-10-03 MED ORDER — OXYCODONE-ACETAMINOPHEN 5-325 MG PO TABS
ORAL_TABLET | ORAL | Status: AC
  Filled 2018-10-03: qty 1

## 2018-10-03 MED ORDER — ONDANSETRON HCL 4 MG/2ML IJ SOLN
INTRAMUSCULAR | Status: AC
  Filled 2018-10-03: qty 2

## 2018-10-03 MED ORDER — DEXAMETHASONE SODIUM PHOSPHATE 10 MG/ML IJ SOLN
INTRAMUSCULAR | Status: DC | PRN
  Administered 2018-10-03: 10 mg via INTRAVENOUS

## 2018-10-03 MED ORDER — DEXAMETHASONE SODIUM PHOSPHATE 10 MG/ML IJ SOLN
INTRAMUSCULAR | Status: AC
  Filled 2018-10-03: qty 1

## 2018-10-03 MED ORDER — SUGAMMADEX SODIUM 200 MG/2ML IV SOLN
INTRAVENOUS | Status: DC | PRN
  Administered 2018-10-03: 200 mg via INTRAVENOUS

## 2018-10-03 MED ORDER — IBUPROFEN 800 MG PO TABS: 800.0000 mg | ORAL_TABLET | Freq: Three times a day (TID) | ORAL | 0 refills | Status: DC | PRN

## 2018-10-03 MED ORDER — LACTATED RINGERS IV SOLN
INTRAVENOUS | Status: DC
  Administered 2018-10-03: 09:00:00 via INTRAVENOUS

## 2018-10-03 MED ORDER — ROCURONIUM BROMIDE 100 MG/10ML IV SOLN
INTRAVENOUS | Status: DC | PRN
  Administered 2018-10-03: 30 mg via INTRAVENOUS

## 2018-10-03 MED ORDER — OXYCODONE-ACETAMINOPHEN 5-325 MG PO TABS
1.0000 | ORAL_TABLET | Freq: Once | ORAL | Status: AC
  Administered 2018-10-03: 1 via ORAL

## 2018-10-03 MED ORDER — ESMOLOL HCL 100 MG/10ML IV SOLN
INTRAVENOUS | Status: DC | PRN
  Administered 2018-10-03 (×2): 10 mg via INTRAVENOUS
  Administered 2018-10-03 (×2): 20 mg via INTRAVENOUS

## 2018-10-03 MED ORDER — KETAMINE HCL 10 MG/ML IJ SOLN
INTRAMUSCULAR | Status: DC | PRN
  Administered 2018-10-03: 25 mg via INTRAVENOUS

## 2018-10-03 MED ORDER — SODIUM CHLORIDE 0.9 % IR SOLN
Status: DC | PRN
  Administered 2018-10-03: 1000 mL

## 2018-10-03 MED ORDER — LIDOCAINE 2% (20 MG/ML) 5 ML SYRINGE
INTRAMUSCULAR | Status: DC | PRN
  Administered 2018-10-03: 80 mg via INTRAVENOUS

## 2018-10-03 MED ORDER — HYDROMORPHONE HCL 1 MG/ML IJ SOLN
INTRAMUSCULAR | Status: AC
  Filled 2018-10-03: qty 1

## 2018-10-03 MED ORDER — FENTANYL CITRATE (PF) 100 MCG/2ML IJ SOLN
INTRAMUSCULAR | Status: DC | PRN
  Administered 2018-10-03: 50 ug via INTRAVENOUS
  Administered 2018-10-03 (×2): 25 ug via INTRAVENOUS
  Administered 2018-10-03: 50 ug via INTRAVENOUS

## 2018-10-03 MED ORDER — MEPERIDINE HCL 50 MG/ML IJ SOLN: 6.2500 mg | INTRAMUSCULAR | Status: DC | PRN

## 2018-10-03 MED ORDER — ONDANSETRON HCL 4 MG/2ML IJ SOLN
INTRAMUSCULAR | Status: DC | PRN
  Administered 2018-10-03 (×2): 4 mg via INTRAVENOUS

## 2018-10-03 MED ORDER — MIDAZOLAM HCL 5 MG/5ML IJ SOLN
INTRAMUSCULAR | Status: DC | PRN
  Administered 2018-10-03: 2 mg via INTRAVENOUS

## 2018-10-03 MED ORDER — CHLORHEXIDINE GLUCONATE CLOTH 2 % EX PADS: 6.0000 | MEDICATED_PAD | Freq: Once | CUTANEOUS | Status: DC

## 2018-10-03 MED ORDER — MIDAZOLAM HCL 2 MG/2ML IJ SOLN
INTRAMUSCULAR | Status: AC
  Filled 2018-10-03: qty 2

## 2018-10-03 MED ORDER — ESMOLOL HCL 100 MG/10ML IV SOLN
INTRAVENOUS | Status: AC
  Filled 2018-10-03: qty 10

## 2018-10-03 MED ORDER — HYDROMORPHONE HCL 1 MG/ML IJ SOLN
0.2500 mg | INTRAMUSCULAR | Status: DC | PRN
  Administered 2018-10-03 (×2): 0.5 mg via INTRAVENOUS

## 2018-10-03 MED ORDER — ACETAMINOPHEN 500 MG PO TABS
ORAL_TABLET | ORAL | Status: AC
  Administered 2018-10-03: 1000 mg via ORAL
  Filled 2018-10-03: qty 2

## 2018-10-03 MED ORDER — ACETAMINOPHEN 500 MG PO TABS
1000.0000 mg | ORAL_TABLET | ORAL | Status: AC
  Administered 2018-10-03: 1000 mg via ORAL

## 2018-10-03 MED ORDER — GABAPENTIN 300 MG PO CAPS
300.0000 mg | ORAL_CAPSULE | ORAL | Status: AC
  Administered 2018-10-03: 300 mg via ORAL

## 2018-10-03 MED ORDER — METOCLOPRAMIDE HCL 5 MG/ML IJ SOLN
INTRAMUSCULAR | Status: AC
  Filled 2018-10-03: qty 2

## 2018-10-03 MED ORDER — CEFOTETAN DISODIUM-DEXTROSE 2-2.08 GM-%(50ML) IV SOLR
INTRAVENOUS | Status: AC
  Filled 2018-10-03: qty 50

## 2018-10-03 MED ORDER — BUPIVACAINE-EPINEPHRINE (PF) 0.25% -1:200000 IJ SOLN
INTRAMUSCULAR | Status: AC
  Filled 2018-10-03: qty 30

## 2018-10-03 MED ORDER — HYDROCODONE-ACETAMINOPHEN 5-325 MG PO TABS: 1.0000 | ORAL_TABLET | Freq: Four times a day (QID) | ORAL | 0 refills | Status: DC | PRN

## 2018-10-03 MED ORDER — PROPOFOL 10 MG/ML IV BOLUS
INTRAVENOUS | Status: DC | PRN
  Administered 2018-10-03: 180 mg via INTRAVENOUS

## 2018-10-03 MED ORDER — LIDOCAINE IN D5W 4-5 MG/ML-% IV SOLN
INTRAVENOUS | Status: DC | PRN
  Administered 2018-10-03: 25 ug/kg/min via INTRAVENOUS

## 2018-10-03 MED ORDER — SUCCINYLCHOLINE CHLORIDE 200 MG/10ML IV SOSY
PREFILLED_SYRINGE | INTRAVENOUS | Status: AC
  Filled 2018-10-03: qty 10

## 2018-10-03 MED ORDER — MIDAZOLAM HCL 5 MG/5ML IJ SOLN: INTRAMUSCULAR | Status: DC | PRN

## 2018-10-03 MED ORDER — 0.9 % SODIUM CHLORIDE (POUR BTL) OPTIME
TOPICAL | Status: DC | PRN
  Administered 2018-10-03: 1000 mL

## 2018-10-03 SURGICAL SUPPLY — 39 items
APPLIER CLIP 5 13 M/L LIGAMAX5 (MISCELLANEOUS)
CANISTER SUCT 3000ML PPV (MISCELLANEOUS) ×2 IMPLANT
CHLORAPREP W/TINT 26ML (MISCELLANEOUS) ×2 IMPLANT
CLIP APPLIE 5 13 M/L LIGAMAX5 (MISCELLANEOUS) IMPLANT
CLIP VESOLOCK XL 6/CT (CLIP) ×2 IMPLANT
COVER SURGICAL LIGHT HANDLE (MISCELLANEOUS) ×2 IMPLANT
COVER WAND RF STERILE (DRAPES) IMPLANT
DERMABOND ADVANCED (GAUZE/BANDAGES/DRESSINGS) ×1
DERMABOND ADVANCED .7 DNX12 (GAUZE/BANDAGES/DRESSINGS) ×1 IMPLANT
ELECT REM PT RETURN 9FT ADLT (ELECTROSURGICAL) ×2
ELECTRODE REM PT RTRN 9FT ADLT (ELECTROSURGICAL) ×1 IMPLANT
ENDOLOOP SUT PDS II  0 18 (SUTURE)
ENDOLOOP SUT PDS II 0 18 (SUTURE) IMPLANT
GLOVE BIOGEL PI IND STRL 7.0 (GLOVE) ×1 IMPLANT
GLOVE BIOGEL PI INDICATOR 7.0 (GLOVE) ×1
GLOVE SURG SS PI 7.0 STRL IVOR (GLOVE) ×2 IMPLANT
GOWN STRL REUS W/ TWL LRG LVL3 (GOWN DISPOSABLE) ×3 IMPLANT
GOWN STRL REUS W/TWL LRG LVL3 (GOWN DISPOSABLE) ×3
GRASPER SUT TROCAR 14GX15 (MISCELLANEOUS) ×2 IMPLANT
KIT BASIN OR (CUSTOM PROCEDURE TRAY) ×2 IMPLANT
KIT TURNOVER KIT B (KITS) ×2 IMPLANT
NEEDLE 22X1 1/2 (OR ONLY) (NEEDLE) ×2 IMPLANT
NS IRRIG 1000ML POUR BTL (IV SOLUTION) ×2 IMPLANT
PAD ARMBOARD 7.5X6 YLW CONV (MISCELLANEOUS) ×4 IMPLANT
POUCH RETRIEVAL ECOSAC 10 (ENDOMECHANICALS) ×1 IMPLANT
POUCH RETRIEVAL ECOSAC 10MM (ENDOMECHANICALS) ×1
SCISSORS LAP 5X35 DISP (ENDOMECHANICALS) ×2 IMPLANT
SET IRRIG TUBING LAPAROSCOPIC (IRRIGATION / IRRIGATOR) ×2 IMPLANT
SLEEVE ENDOPATH XCEL 5M (ENDOMECHANICALS) ×2 IMPLANT
SPECIMEN JAR SMALL (MISCELLANEOUS) ×2 IMPLANT
SUT MNCRL AB 4-0 PS2 18 (SUTURE) ×2 IMPLANT
TOWEL OR 17X24 6PK STRL BLUE (TOWEL DISPOSABLE) ×2 IMPLANT
TOWEL OR 17X26 10 PK STRL BLUE (TOWEL DISPOSABLE) ×2 IMPLANT
TRAY FOLEY CATH 14FR (SET/KITS/TRAYS/PACK) IMPLANT
TRAY LAPAROSCOPIC MC (CUSTOM PROCEDURE TRAY) ×2 IMPLANT
TROCAR XCEL NON-BLD 11X100MML (ENDOMECHANICALS) ×2 IMPLANT
TROCAR XCEL NON-BLD 5MMX100MML (ENDOMECHANICALS) ×2 IMPLANT
TUBING INSUFFLATION (TUBING) ×2 IMPLANT
WATER STERILE IRR 1000ML POUR (IV SOLUTION) ×2 IMPLANT

## 2018-10-03 NOTE — Anesthesia Postprocedure Evaluation (Signed)
Anesthesia Post Note  Patient: Sierra Hensley  Procedure(s) Performed: APPENDECTOMY LAPAROSCOPIC ERAS PATHWAY (N/A Abdomen)     Patient location during evaluation: PACU Anesthesia Type: General Level of consciousness: awake and alert and oriented Pain management: pain level controlled Vital Signs Assessment: post-procedure vital signs reviewed and stable Respiratory status: spontaneous breathing, nonlabored ventilation and respiratory function stable Cardiovascular status: blood pressure returned to baseline and stable Postop Assessment: no apparent nausea or vomiting Anesthetic complications: no    Last Vitals:  Vitals:   10/03/18 1215 10/03/18 1224  BP: 114/70   Pulse: 87 86  Resp: 17 13  Temp:    SpO2: 100% 100%    Last Pain:  Vitals:   10/03/18 1202  TempSrc:   PainSc: Asleep                 [name] A.

## 2018-10-03 NOTE — Transfer of Care (Signed)
Immediate Anesthesia Transfer of Care Note  Patient: Sierra Hensley  Procedure(s) Performed: APPENDECTOMY LAPAROSCOPIC ERAS PATHWAY (N/A Abdomen)  Patient Location: PACU  Anesthesia Type:General  Level of Consciousness: drowsy  Airway & Oxygen Therapy: Patient Spontanous Breathing and Patient connected to face mask oxygen  Post-op Assessment: Report given to RN and Post -op Vital signs reviewed and stable  Post vital signs: Reviewed and stable  Last Vitals:  Vitals Value Taken Time  BP 118/69 10/03/2018 12:02 PM  Temp    Pulse 88 10/03/2018 12:04 PM  Resp 16 10/03/2018 12:04 PM  SpO2 100 % 10/03/2018 12:04 PM  Vitals shown include unvalidated device data.  Last Pain:  Vitals:   10/03/18 0838  TempSrc:   PainSc: 1       Patients Stated Pain Goal: 1 (10/03/18 1610)  Complications: No apparent anesthesia complications

## 2018-10-03 NOTE — Progress Notes (Signed)
Interpretor called back to PACU  Nicoli Nardozzi, Geroge Baseman, RN

## 2018-10-03 NOTE — Op Note (Signed)
Preoperative diagnosis: previous perforated appendicitis  Postoperative diagnosis: Same   Procedure: laparoscopic appendectomy  Surgeon: Feliciana Rossetti, M.D.  Asst: none  Anesthesia: Gen.   Indications for procedure: Sierra Hensley is a 47 y.o. female with symptoms of perforated appendicitis treated with IR drainage. This has now resolved and she has waited 6 weeks and is ready to proceed with interval appendectomy.  Description of procedure: The patient was brought into the operative suite, placed supine. Anesthesia was administered with endotracheal tube. The patient's left arm was tucked. All pressure points were offloaded by foam padding. The patient was prepped and draped in the usual sterile fashion.  A transverse incision was made to the left subcostal space and a 5mm trocar was [place]. Pneumoperitoneum was applied with high flow low pressure.  1 5mm trocar was placed in the suprapubic space,and one 11mm trocar was placed in a periumbilical space. A transversus abdominal block was placed on the left and right sides. Next, the patient was placed in trendelenberg, rotated to the left. The omentum was retracted cephalad. The cecum and appendix were identified. The appendix was adhered to the posterior peritoneum. Blunt dissection was used to free it away from the peritoneum and surrounding intestine. The base of the appendix was dissected and a window through the mesoappendix was created with blunt dissection. Large Hem-o-lock clips were used to doubly ligate the base of the appendix and mesoappendix. The appendix was cut free with scissors.  The appendix was placed in a specimen bag. The pelvis and RLQ were irrigated.  The appendix was removed via the umbilicus. The pelvic area was inspected and the ureter was identified in the area of the dissection and was peristalsing. The bowel was intact without injury. 0 vicryl was used to close the fascial defect. Pneumoperitoneum was removed, all trocars  were removed. All incisions were closed with 4-0 monocryl subcuticular stitch. The patient woke from anesthesia and was brought to PACU in stable condition.  Findings: adhesions of the appendix to the posterior peritoneum and intestine.  Specimen: appendix  Blood loss: 30 ml  Local anesthesia: 30 ml marcaine  Complications: none  Images:         Feliciana Rossetti, M.D. General, Bariatric, & Minimally Invasive Surgery [facility]

## 2018-10-03 NOTE — Anesthesia Procedure Notes (Addendum)
Procedure Name: Intubation Date/Time: 10/03/2018 10:49 AM Performed by: Gwyndolyn Saxon, CRNA Pre-anesthesia Checklist: Patient identified, Emergency Drugs available, Suction available and Patient being monitored Patient Re-evaluated:Patient Re-evaluated prior to induction Oxygen Delivery Method: Circle System Utilized Preoxygenation: Pre-oxygenation with 100% oxygen Induction Type: IV induction, Rapid sequence and Cricoid Pressure applied Laryngoscope Size: Mac and 3 Grade View: Grade I Tube type: Oral Tube size: 7.0 mm Number of attempts: 1 Airway Equipment and Method: Stylet Placement Confirmation: ETT inserted through vocal cords under direct vision,  positive ETCO2 and breath sounds checked- equal and bilateral Secured at: 22 cm Tube secured with: Tape Dental Injury: Teeth and Oropharynx as per pre-operative assessment  Comments: Performed by Norm Salt, SRNA

## 2018-10-03 NOTE — H&P (Signed)
Sierra Hensley is an 47 y.o. female.   Chief Complaint: h/o perforated appendicitis HPI: 47 yo female with recent perforated appendicitis treated with IR drainage of abscess. She has completed all treatment and after discussion of recurrence rate and some vague abdominal pain has decided to proceed with appendectomy.  Past Medical History:  Diagnosis Date  . 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 CESAREAN SECTION MULTI-GESTATIONAL N/A 12/09/2014   Procedure: CESAREAN SECTION MULTI-GESTATIONAL;  Surgeon: Lavina Hamman, MD;  Location: [facility];  Service: Obstetrics;  Laterality: N/A;    History reviewed. No pertinent family history. Social History:  reports that she has never smoked. She has never used smokeless tobacco. She reports that she does not drink alcohol or use drugs.  Allergies: No Known Allergies  Medications Prior to Admission  Medication Sig Dispense Refill  . ibuprofen (ADVIL,MOTRIN) 800 MG tablet Take 800 mg by mouth every 8 (eight) hours as needed for headache or moderate pain.   0  . feeding supplement, ENSURE ENLIVE, (ENSURE ENLIVE) LIQD Take 237 mLs by mouth 2 (two) times daily between meals. (Patient not taking: Reported on 09/21/2018) 237 mL 12  . saccharomyces boulardii (FLORASTOR) 250 MG capsule Take 1 capsule (250 mg total) by mouth 2 (two) times daily. You can get a probiotic over the counter (Patient not taking: Reported on 09/21/2018)      Results for orders placed or performed during the hospital encounter of 10/03/18 (from the past 48 hour(s))  Pregnancy, urine POC     Status: None   Collection Time: 10/03/18  8:26 AM  Result Value Ref Range   Preg Test, Ur NEGATIVE NEGATIVE    Comment:        THE SENSITIVITY OF THIS METHODOLOGY IS >24 mIU/mL    No results found.  Review of Systems  Constitutional: Negative for chills and fever.  HENT: Negative for hearing loss.   Eyes: Negative for  blurred vision and double vision.  Respiratory: Negative for cough and hemoptysis.   Cardiovascular: Negative for chest pain and palpitations.  Gastrointestinal: Positive for abdominal pain. Negative for nausea and vomiting.  Genitourinary: Negative for dysuria and urgency.  Musculoskeletal: Negative for myalgias and neck pain.  Skin: Negative for itching and rash.  Neurological: Negative for dizziness, tingling and headaches.  Endo/Heme/Allergies: Does not bruise/bleed easily.  Psychiatric/Behavioral: Negative for depression and suicidal ideas.    Blood pressure 108/67, pulse 68, temperature 98.2 F (36.8 C), temperature source Oral, resp. rate 20, height 5\' 4"  (1.626 m), weight 57.2 kg, last menstrual period 09/06/2018, SpO2 100 %. Physical Exam  Vitals reviewed. Constitutional: She is oriented to person, place, and time. She appears well-developed and well-nourished.  HENT:  Head: Normocephalic and atraumatic.  Eyes: Pupils are equal, round, and reactive to light. Conjunctivae and EOM are normal.  Neck: Normal range of motion. Neck supple.  Cardiovascular: Normal rate and regular rhythm.  Respiratory: Effort normal and breath sounds normal.  GI: Soft. Bowel sounds are normal. She exhibits no distension. There is no tenderness.  Musculoskeletal: Normal range of motion.  Neurological: She is alert and oriented to person, place, and time.  Skin: Skin is warm and dry.  Psychiatric: She has a normal mood and affect. Her behavior is normal.     Assessment/Plan 47 yo female with h/o perforated appendicitis -lap interval appendectomy -planned outpatient procedure -enhanced recovery protocol  Rodman Pickle, MD 10/03/2018, 9:40 AM

## 2018-10-03 NOTE — Anesthesia Preprocedure Evaluation (Signed)
Anesthesia Evaluation  Patient identified by MRN, date of birth, ID band Patient awake    Reviewed: Allergy & Precautions, NPO status , Patient's Chart, lab work & pertinent test results  Airway Mallampati: III  TM Distance: >3 FB Neck ROM: Full    Dental no notable dental hx. (+) Teeth Intact   Pulmonary neg pulmonary ROS,    Pulmonary exam normal breath sounds clear to auscultation       Cardiovascular negative cardio ROS Normal cardiovascular exam Rhythm:Regular Rate:Normal     Neuro/Psych negative neurological ROS  negative psych ROS   GI/Hepatic Neg liver ROS, GERD  ,Acute Appendicitis   Endo/Other  diabetes, Gestational  Renal/GU negative Renal ROS  negative genitourinary   Musculoskeletal   Abdominal (+)  Abdomen: tender.    Peds  Hematology negative hematology ROS (+)   Anesthesia Other Findings   Reproductive/Obstetrics                             Anesthesia Physical Anesthesia Plan  ASA: II  Anesthesia Plan: General   Post-op Pain Management:    Induction: Intravenous, Rapid sequence and Cricoid pressure planned  PONV Risk Score and Plan: 4 or greater and Ondansetron, Dexamethasone and Treatment may vary due to age or medical condition  Airway Management Planned: Oral ETT  Additional Equipment:   Intra-op Plan:   Post-operative Plan: Extubation in OR  Informed Consent: I have reviewed the patients History and Physical, chart, labs and discussed the procedure including the risks, benefits and alternatives for the proposed anesthesia with the patient or authorized representative who has indicated his/her understanding and acceptance.   Dental advisory given  Plan Discussed with: CRNA and Surgeon  Anesthesia Plan Comments:         Anesthesia Quick Evaluation

## 2018-10-04 ENCOUNTER — Encounter (HOSPITAL_COMMUNITY): Payer: Self-pay | Admitting: General Surgery

## 2018-10-12 ENCOUNTER — Other Ambulatory Visit: Payer: Self-pay

## 2018-10-12 ENCOUNTER — Inpatient Hospital Stay (HOSPITAL_COMMUNITY)
Admission: EM | Admit: 2018-10-12 | Discharge: 2018-10-16 | DRG: 390 | Disposition: A | Discharge status: Home / Self Care | Payer: BLUE CROSS/BLUE SHIELD | Attending: General Surgery | Admitting: General Surgery

## 2018-10-12 ENCOUNTER — Encounter (HOSPITAL_COMMUNITY): Payer: Self-pay | Admitting: Pharmacy Technician

## 2018-10-12 DIAGNOSIS — Z9049 Acquired absence of other specified parts of digestive tract: Secondary | ICD-10-CM

## 2018-10-12 DIAGNOSIS — K56609 Unspecified intestinal obstruction, unspecified as to partial versus complete obstruction: Secondary | ICD-10-CM | POA: Diagnosis not present

## 2018-10-12 DIAGNOSIS — R1033 Periumbilical pain: Secondary | ICD-10-CM

## 2018-10-12 LAB — URINALYSIS, ROUTINE W REFLEX MICROSCOPIC
BILIRUBIN URINE: NEGATIVE
GLUCOSE, UA: NEGATIVE mg/dL
HGB URINE DIPSTICK: NEGATIVE
KETONES UR: NEGATIVE mg/dL
Leukocytes, UA: NEGATIVE
NITRITE: NEGATIVE
PH: 7 (ref 5.0–8.0)
Protein, ur: NEGATIVE mg/dL
Specific Gravity, Urine: 1.001 — ABNORMAL LOW (ref 1.005–1.030)

## 2018-10-12 LAB — CBC
HEMATOCRIT: 37.4 % (ref 36.0–46.0)
HEMOGLOBIN: 12 g/dL (ref 12.0–15.0)
MCH: 28.1 pg (ref 26.0–34.0)
MCHC: 32.1 g/dL (ref 30.0–36.0)
MCV: 87.6 fL (ref 80.0–100.0)
Platelets: 295 10*3/uL (ref 150–400)
RBC: 4.27 MIL/uL (ref 3.87–5.11)
RDW: 12.3 % (ref 11.5–15.5)
WBC: 7.9 10*3/uL (ref 4.0–10.5)
nRBC: 0 % (ref 0.0–0.2)

## 2018-10-12 LAB — COMPREHENSIVE METABOLIC PANEL
ALK PHOS: 57 U/L (ref 38–126)
ALT: 11 U/L (ref 0–44)
ANION GAP: 10 (ref 5–15)
AST: 15 U/L (ref 15–41)
Albumin: 3.7 g/dL (ref 3.5–5.0)
BILIRUBIN TOTAL: 0.5 mg/dL (ref 0.3–1.2)
BUN: 8 mg/dL (ref 6–20)
CALCIUM: 9.3 mg/dL (ref 8.9–10.3)
CO2: 28 mmol/L (ref 22–32)
CREATININE: 0.67 mg/dL (ref 0.44–1.00)
Chloride: 99 mmol/L (ref 98–111)
GFR calc Af Amer: >60 mL/min (ref >60)
GFR calc non Af Amer: >60 mL/min (ref >60)
GLUCOSE: 109 mg/dL — AB (ref 70–99)
Potassium: 4.6 mmol/L (ref 3.5–5.1)
Sodium: 137 mmol/L (ref 135–145)
TOTAL PROTEIN: 7.6 g/dL (ref 6.5–8.1)

## 2018-10-12 LAB — LIPASE, BLOOD: Lipase: 34 U/L (ref 11–51)

## 2018-10-12 LAB — I-STAT BETA HCG BLOOD, ED (MC, WL, AP ONLY): I-stat hCG, quantitative: <5 m[IU]/mL (ref <5)

## 2018-10-12 LAB — I-STAT CG4 LACTIC ACID, ED: Lactic Acid, Venous: 1.14 mmol/L (ref 0.5–1.9)

## 2018-10-12 MED ORDER — FENTANYL CITRATE (PF) 100 MCG/2ML IJ SOLN
25.0000 ug | Freq: Once | INTRAMUSCULAR | Status: AC
  Administered 2018-10-12: 25 ug via INTRAVENOUS
  Filled 2018-10-12: qty 2

## 2018-10-12 MED ORDER — ONDANSETRON HCL 4 MG/2ML IJ SOLN
4.0000 mg | Freq: Once | INTRAMUSCULAR | Status: AC
  Administered 2018-10-12: 4 mg via INTRAVENOUS
  Filled 2018-10-12: qty 2

## 2018-10-12 MED ORDER — SODIUM CHLORIDE 0.9 % IV BOLUS
1000.0000 mL | Freq: Once | INTRAVENOUS | Status: AC
  Administered 2018-10-12: 1000 mL via INTRAVENOUS

## 2018-10-12 NOTE — ED Provider Notes (Signed)
[facility] Emergency Department Provider Note MRN:  [id number]  Arrival date & time: 10/12/18     Chief Complaint   Abdominal Pain   History of Present Illness   Sierra Hensley is a 47 y.o. year-old female with a history of perforated appendicitis presenting to the ED with chief complaint of abdominal pain.  History obtained with the assistance of [place] interpreter.  Patient suffered from a perforated appendix back in September, was managed with a abdominal drain.  Had a more elective laparoscopic appendectomy 10 days ago.  Has been recovering well up until today.  Endorsing moderate to severe periumbilical pain, nonradiating.  Associated with nausea, no vomiting.  Was able to have a bowel movement this morning, which was small, but since then has been unable to have bowel movements, not passing much gas either.  Denies fever, no chest pain or shortness of breath, no dysuria.  Review of Systems  A complete 10 system review of systems was obtained and all systems are negative except as noted in the HPI and PMH.   Patient's Health History    Past Medical History:  Diagnosis Date  . 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 CESAREAN SECTION MULTI-GESTATIONAL N/A 12/09/2014   Procedure: CESAREAN SECTION MULTI-GESTATIONAL;  Surgeon: Lavina Hamman, MD;  Location: [facility];  Service: Obstetrics;  Laterality: N/A;  . LAPAROSCOPIC APPENDECTOMY N/A 10/03/2018   Procedure: APPENDECTOMY LAPAROSCOPIC ERAS PATHWAY;  Surgeon: Sheliah Hatch De Blanch, MD;  Location: [facility];  Service: General;  Laterality: N/A;    No family history on file.  Social History   Socioeconomic History  . Marital status: Married    Spouse name: Not on file  . Number of children: 3  . Years of education: Not on file  . Highest education level: Not on file  Occupational History  . Not on file  Social Needs  . Financial resource strain: Not  on file  . Food insecurity:    Worry: Not on file    Inability: Not on file  . Transportation needs:    Medical: Not on file    Non-medical: Not on file  Tobacco Use  . Smoking status: Never Smoker  . Smokeless tobacco: Never Used  Substance and Sexual Activity  . Alcohol use: No  . Drug use: No  . Sexual activity: Yes  Lifestyle  . Physical activity:    Days per week: Not on file    Minutes per session: Not on file  . Stress: Not on file  Relationships  . Social connections:    Talks on phone: Not on file    Gets together: Not on file    Attends religious service: Not on file    Active member of club or organization: Not on file    Attends meetings of clubs or organizations: Not on file    Relationship status: Not on file  . Intimate partner violence:    Fear of current or ex partner: Not on file    Emotionally abused: Not on file    Physically abused: Not on file    Forced sexual activity: Not on file  Other Topics Concern  . Not on file  Social History Narrative  . Not on file     Physical Exam  Vital Signs and Nursing Notes reviewed Vitals:   10/12/18 2330 10/12/18 2345  BP: 117/76 114/75  Pulse: 75 77  Temp:  SpO2: 100% 100%    CONSTITUTIONAL: Well-appearing, NAD NEURO:  Alert and oriented x 3, no focal deficits EYES:  eyes equal and reactive ENT/NECK:  no LAD, no JVD CARDIO: Regular rate, well-perfused, normal S1 and S2 PULM:  CTAB no wheezing or rhonchi GI/GU:  normal bowel sounds, non-distended, moderate tenderness palpation to the periumbilical region, well-healing laparoscopic surgical sites MSK/SPINE:  No gross deformities, no edema SKIN:  no rash, atraumatic PSYCH:  Appropriate speech and behavior  Diagnostic and Interventional Summary    EKG Interpretation  Date/Time:    Ventricular Rate:    PR Interval:    QRS Duration:   QT Interval:    QTC Calculation:   R Axis:     Text Interpretation:        Labs Reviewed  COMPREHENSIVE  METABOLIC PANEL - Abnormal; Notable for the following components:      Result Value   Glucose, Bld 109 (*)    All other components within normal limits  URINALYSIS, ROUTINE W REFLEX MICROSCOPIC - Abnormal; Notable for the following components:   Color, Urine COLORLESS (*)    Specific Gravity, Urine 1.001 (*)    All other components within normal limits  CBC  LIPASE, BLOOD  I-STAT CG4 LACTIC ACID, ED  I-STAT BETA HCG BLOOD, ED ([facility], [facility], [facility] ONLY)    CT ABDOMEN PELVIS W CONTRAST    (Results Pending)    Medications  sodium chloride 0.9 % bolus 1,000 mL (1,000 mLs Intravenous New Bag/Given 10/12/18 2239)  ondansetron (ZOFRAN) injection 4 mg (4 mg Intravenous Given 10/12/18 2241)  fentaNYL (SUBLIMAZE) injection 25 mcg (25 mcg Intravenous Given 10/12/18 2241)     Procedures Critical Care  ED Course and Medical Decision Making  I have reviewed the triage vital signs and the nursing notes.  Pertinent labs & imaging results that were available during my care of the patient were reviewed by me and considered in my medical decision making (see below for details).  Periumbilical abdominal pain, nausea, decreased stool output and flatulence in this postop day 4910  47 year old female after laparoscopic appendectomy.  History of perforated appendicitis.  Question of small bowel obstruction versus intra-abdominal abscess, CT pending.  Labs reassuring, still awaiting CT scan.  CT unremarkable and patient feeling better and tolerating p.o. can be discharged.  Low threshold to consult general surgery given the recent laparoscopic procedure.  Signed out to oncoming provider at shift change.   Elmer SowMichael M. Pilar PlateBero, MD [facility] [email or web address]  Final Clinical Impressions(s) / ED Diagnoses     ICD-10-CM   1. Periumbilical abdominal pain R10.33     ED Discharge Orders    None         Sabas SousBero, Warnell Rasnic M, MD 10/13/18 0000

## 2018-10-13 ENCOUNTER — Emergency Department (HOSPITAL_COMMUNITY): Payer: BLUE CROSS/BLUE SHIELD

## 2018-10-13 ENCOUNTER — Other Ambulatory Visit: Payer: Self-pay

## 2018-10-13 ENCOUNTER — Encounter (HOSPITAL_COMMUNITY): Payer: Self-pay

## 2018-10-13 DIAGNOSIS — R1033 Periumbilical pain: Secondary | ICD-10-CM | POA: Diagnosis present

## 2018-10-13 DIAGNOSIS — Z9049 Acquired absence of other specified parts of digestive tract: Secondary | ICD-10-CM | POA: Diagnosis not present

## 2018-10-13 DIAGNOSIS — K56609 Unspecified intestinal obstruction, unspecified as to partial versus complete obstruction: Secondary | ICD-10-CM | POA: Diagnosis present

## 2018-10-13 HISTORY — DX: Unspecified intestinal obstruction, unspecified as to partial versus complete obstruction: K56.609

## 2018-10-13 LAB — COMPREHENSIVE METABOLIC PANEL
ALK PHOS: 51 U/L (ref 38–126)
ALT: 11 U/L (ref 0–44)
ANION GAP: 7 (ref 5–15)
AST: 15 U/L (ref 15–41)
Albumin: 3.2 g/dL — ABNORMAL LOW (ref 3.5–5.0)
BUN: 7 mg/dL (ref 6–20)
CALCIUM: 8.8 mg/dL — AB (ref 8.9–10.3)
CHLORIDE: 105 mmol/L (ref 98–111)
CO2: 25 mmol/L (ref 22–32)
CREATININE: 0.74 mg/dL (ref 0.44–1.00)
Glucose, Bld: 127 mg/dL — ABNORMAL HIGH (ref 70–99)
Potassium: 4.3 mmol/L (ref 3.5–5.1)
SODIUM: 137 mmol/L (ref 135–145)
Total Bilirubin: 0.3 mg/dL (ref 0.3–1.2)
Total Protein: 6.5 g/dL (ref 6.5–8.1)

## 2018-10-13 LAB — CBC
HCT: 34.6 % — ABNORMAL LOW (ref 36.0–46.0)
Hemoglobin: 10.6 g/dL — ABNORMAL LOW (ref 12.0–15.0)
MCH: 27.2 pg (ref 26.0–34.0)
MCHC: 30.6 g/dL (ref 30.0–36.0)
MCV: 88.7 fL (ref 80.0–100.0)
NRBC: 0 % (ref 0.0–0.2)
PLATELETS: 287 10*3/uL (ref 150–400)
RBC: 3.9 MIL/uL (ref 3.87–5.11)
RDW: 12.5 % (ref 11.5–15.5)
WBC: 6.1 10*3/uL (ref 4.0–10.5)

## 2018-10-13 MED ORDER — ONDANSETRON 4 MG PO TBDP: 4.0000 mg | ORAL_TABLET | Freq: Four times a day (QID) | ORAL | Status: DC | PRN

## 2018-10-13 MED ORDER — ACETAMINOPHEN 650 MG RE SUPP: 650.0000 mg | Freq: Four times a day (QID) | RECTAL | Status: DC | PRN

## 2018-10-13 MED ORDER — SODIUM CHLORIDE 0.9 % IV SOLN
3.0000 g | Freq: Four times a day (QID) | INTRAVENOUS | Status: DC
  Administered 2018-10-13 – 2018-10-14 (×6): 3 g via INTRAVENOUS
  Filled 2018-10-13 (×8): qty 3

## 2018-10-13 MED ORDER — IOHEXOL 300 MG/ML  SOLN
100.0000 mL | Freq: Once | INTRAMUSCULAR | Status: AC | PRN
  Administered 2018-10-13: 100 mL via INTRAVENOUS

## 2018-10-13 MED ORDER — MORPHINE SULFATE (PF) 2 MG/ML IV SOLN
2.0000 mg | INTRAVENOUS | Status: DC | PRN
  Administered 2018-10-13 (×3): 2 mg via INTRAVENOUS
  Filled 2018-10-13 (×4): qty 1

## 2018-10-13 MED ORDER — OXYCODONE HCL 5 MG PO TABS: 5.0000 mg | ORAL_TABLET | ORAL | Status: DC | PRN

## 2018-10-13 MED ORDER — MORPHINE SULFATE (PF) 4 MG/ML IV SOLN
6.0000 mg | Freq: Once | INTRAVENOUS | Status: AC
  Administered 2018-10-13: 6 mg via INTRAVENOUS
  Filled 2018-10-13: qty 2

## 2018-10-13 MED ORDER — DEXTROSE-NACL 5-0.45 % IV SOLN
INTRAVENOUS | Status: DC
  Administered 2018-10-13 – 2018-10-14 (×3): via INTRAVENOUS

## 2018-10-13 MED ORDER — METOCLOPRAMIDE HCL 5 MG/ML IJ SOLN
10.0000 mg | Freq: Four times a day (QID) | INTRAMUSCULAR | Status: DC
  Administered 2018-10-13 – 2018-10-16 (×11): 10 mg via INTRAVENOUS
  Filled 2018-10-13 (×12): qty 2

## 2018-10-13 MED ORDER — ONDANSETRON HCL 4 MG/2ML IJ SOLN
4.0000 mg | Freq: Once | INTRAMUSCULAR | Status: AC
  Administered 2018-10-13: 4 mg via INTRAVENOUS
  Filled 2018-10-13: qty 2

## 2018-10-13 MED ORDER — ENOXAPARIN SODIUM 40 MG/0.4ML ~~LOC~~ SOLN
40.0000 mg | SUBCUTANEOUS | Status: DC
  Administered 2018-10-13 – 2018-10-15 (×3): 40 mg via SUBCUTANEOUS
  Filled 2018-10-13 (×3): qty 0.4

## 2018-10-13 MED ORDER — ONDANSETRON HCL 4 MG/2ML IJ SOLN: 4.0000 mg | Freq: Four times a day (QID) | INTRAMUSCULAR | Status: DC | PRN

## 2018-10-13 MED ORDER — INFLUENZA VAC SPLIT QUAD 0.5 ML IM SUSY: 0.5000 mL | PREFILLED_SYRINGE | INTRAMUSCULAR | Status: DC | PRN

## 2018-10-13 MED ORDER — METOPROLOL TARTRATE 5 MG/5ML IV SOLN: 5.0000 mg | Freq: Four times a day (QID) | INTRAVENOUS | Status: DC | PRN

## 2018-10-13 MED ORDER — ACETAMINOPHEN 325 MG PO TABS: 650.0000 mg | ORAL_TABLET | Freq: Four times a day (QID) | ORAL | Status: DC | PRN

## 2018-10-13 MED ORDER — SIMETHICONE 80 MG PO CHEW
40.0000 mg | CHEWABLE_TABLET | Freq: Four times a day (QID) | ORAL | Status: DC | PRN
  Filled 2018-10-13: qty 1

## 2018-10-13 NOTE — Progress Notes (Signed)
Patient arrived to [address] from ED. Patient alert and oriented x4. Admission assessment complete. Complaints of pain addressed. No other complaints at this time. Will continue to monitor patient.

## 2018-10-13 NOTE — H&P (Signed)
Sierra Hensley is an 47 y.o. female.   Chief Complaint: abdominal pain HPI: 47 yo female 10 days out from lap appendectomy for interval appendectomy. She has developed 2 days of abdominal pain. The pain comes and goes. It is a twisting sensation located in her mid abdomen. She has nausea but no vomiting. She has not had flatus or BM in 2 days. She denies fevers. She feels like her intestines are twisted.  Past Medical History:  Diagnosis Date  . 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 CESAREAN SECTION MULTI-GESTATIONAL N/A 12/09/2014   Procedure: CESAREAN SECTION MULTI-GESTATIONAL;  Surgeon: Cheri Fowler, MD;  Location: [facility];  Service: Obstetrics;  Laterality: N/A;  . LAPAROSCOPIC APPENDECTOMY N/A 10/03/2018   Procedure: [place];  Surgeon: Kieth Brightly Arta Bruce, MD;  Location: [facility];  Service: General;  Laterality: N/A;    No family history on file. Social History:  reports that she has never smoked. She has never used smokeless tobacco. She reports that she does not drink alcohol or use drugs.  Allergies: No Known Allergies   (Not in a hospital admission)  Results for orders placed or performed during the hospital encounter of 10/12/18 (from the past 48 hour(s))  CBC     Status: None   Collection Time: 10/12/18 10:23 PM  Result Value Ref Range   WBC 7.9 4.0 - 10.5 K/uL   RBC 4.27 3.87 - 5.11 MIL/uL   Hemoglobin 12.0 12.0 - 15.0 g/dL   HCT 37.4 36.0 - 46.0 %   MCV 87.6 80.0 - 100.0 fL   MCH 28.1 26.0 - 34.0 pg   MCHC 32.1 30.0 - 36.0 g/dL   RDW 12.3 11.5 - 15.5 %   Platelets 295 150 - 400 K/uL   nRBC 0.0 0.0 - 0.2 %    Comment: Performed at [facility], [place] [address] [place], [place] [address]  Comprehensive metabolic panel     Status: Abnormal   Collection Time: 10/12/18 10:23 PM  Result Value Ref Range   Sodium 137 135 - 145 mmol/L   Potassium 4.6 3.5 - 5.1 mmol/L    Chloride 99 98 - 111 mmol/L   CO2 28 22 - 32 mmol/L   Glucose, Bld 109 (H) 70 - 99 mg/dL   BUN 8 6 - 20 mg/dL   Creatinine, Ser 0.67 0.44 - 1.00 mg/dL   Calcium 9.3 8.9 - 10.3 mg/dL   Total Protein 7.6 6.5 - 8.1 g/dL   Albumin 3.7 3.5 - 5.0 g/dL   AST 15 15 - 41 U/L   ALT 11 0 - 44 U/L   Alkaline Phosphatase 57 38 - 126 U/L   Total Bilirubin 0.5 0.3 - 1.2 mg/dL   GFR calc non Af Amer >60 >60 mL/min   GFR calc Af Amer >60 >60 mL/min    Comment: (NOTE) The eGFR has been calculated using the CKD EPI equation. This calculation has not been validated in all clinical situations. eGFR's persistently <60 mL/min signify possible Chronic Kidney Disease.    Anion gap 10 5 - 15    Comment: Performed at [facility] [address], [place], [place] [address]  Lipase, blood     Status: None   Collection Time: 10/12/18 10:23 PM  Result Value Ref Range   Lipase 34 11 - 51 U/L    Comment: Performed at [facility]  [address], [place], [place] [address]  I-Stat CG4 Lactic Acid, ED     Status: None   Collection Time: 10/12/18 10:38 PM  Result Value Ref Range   Lactic Acid, Venous 1.14 0.5 - 1.9 mmol/L  I-Stat Beta hCG blood, ED ([facility], [facility], [facility] only)     Status: None   Collection Time: 10/12/18 11:04 PM  Result Value Ref Range   I-stat hCG, quantitative <5.0 <5 mIU/mL   Comment 3            Comment:   GEST. AGE      CONC.  (mIU/mL)   <=1 WEEK        5 - 50     2 WEEKS       50 - 500     3 WEEKS       100 - 10,000     4 WEEKS     1,000 - 30,000        FEMALE AND NON-PREGNANT FEMALE:     LESS THAN 5 mIU/mL   Urinalysis, Routine w reflex microscopic     Status: Abnormal   Collection Time: 10/12/18 11:07 PM  Result Value Ref Range   Color, Urine COLORLESS (A) YELLOW   APPearance CLEAR CLEAR   Specific Gravity, Urine 1.001 (L) 1.005 - 1.030   pH 7.0 5.0 - 8.0   Glucose, UA NEGATIVE NEGATIVE mg/dL   Hgb urine dipstick NEGATIVE NEGATIVE   Bilirubin Urine NEGATIVE NEGATIVE    Ketones, ur NEGATIVE NEGATIVE mg/dL   Protein, ur NEGATIVE NEGATIVE mg/dL   Nitrite NEGATIVE NEGATIVE   Leukocytes, UA NEGATIVE NEGATIVE    Comment: Performed at [facility] [address], [place], [place] [address]   Ct Abdomen Pelvis W Contrast  Result Date: 10/13/2018 CLINICAL DATA:  Centralized abdominal pain with nausea. EXAM: CT ABDOMEN AND PELVIS WITH CONTRAST TECHNIQUE: Multidetector CT imaging of the abdomen and pelvis was performed using the standard protocol following bolus administration of intravenous contrast. CONTRAST:  133m OMNIPAQUE IOHEXOL 300 MG/ML  SOLN COMPARISON:  09/21/2018 FINDINGS: Lower chest: No acute abnormality. Hepatobiliary: No focal liver abnormality is seen. No gallstones, gallbladder wall thickening, or biliary dilatation. Pancreas: Unremarkable. No pancreatic ductal dilatation or surrounding inflammatory changes. Spleen: Normal in size without focal abnormality. Adrenals/Urinary Tract: Adrenal glands are unremarkable. Kidneys are normal, without renal calculi, focal lesion, or hydronephrosis. Bladder is unremarkable. Stomach/Bowel: Fluid in the stomach. Multiple dilated loops of small bowel measuring up to 3 cm in the mid abdomen with loops of severe bowel wall thickening and surrounding inflammatory changes. Relative transition point in the right mid lower abdomen at the site of recent surgery (image 51/series 3). Small volume pneumoperitoneum. No portal venous gas. Vascular/Lymphatic: No significant vascular findings are present. No enlarged abdominal or pelvic lymph nodes. Reproductive: Uterus and bilateral adnexa are unremarkable. Other: No abdominal wall hernia or abnormality. No abdominopelvic ascites. Musculoskeletal: No acute or significant osseous findings. IMPRESSION: 1. Interval appendectomy. Multiple dilated loops of small bowel measuring up to 3 cm in the mid abdomen with loops of severe bowel wall thickening and surrounding inflammatory changes most  concerning for bowel obstruction with a relative transition point in the right mid lower abdomen at the site of recent surgery (image 51/series 3). Small volume pneumoperitoneum concerning for perforated viscus versus secondary to appendectomy 10 days ago. Critical Value/emergent results were called by telephone at the time of interpretation on 10/13/2018 at 12:21 am [facility] these results.  Electronically Signed   By: Kathreen Devoid   On: 10/13/2018 00:25    Review of Systems  Constitutional: Negative for chills and fever.  HENT: Negative for hearing loss.   Eyes: Negative for blurred vision and double vision.  Respiratory: Negative for cough and hemoptysis.   Cardiovascular: Negative for chest pain and palpitations.  Gastrointestinal: Positive for abdominal pain and nausea. Negative for vomiting.  Genitourinary: Negative for dysuria and urgency.  Musculoskeletal: Negative for myalgias and neck pain.  Skin: Negative for itching and rash.  Neurological: Negative for dizziness, tingling and headaches.  Endo/Heme/Allergies: Does not bruise/bleed easily.  Psychiatric/Behavioral: Negative for depression and suicidal ideas.    Blood pressure 114/75, pulse 77, temperature 97.9 F (36.6 C), temperature source Oral, SpO2 100 %. Physical Exam  Vitals reviewed. Constitutional: She is oriented to person, place, and time. She appears well-developed and well-nourished.  HENT:  Head: Normocephalic and atraumatic.  Eyes: Pupils are equal, round, and reactive to light. Conjunctivae and EOM are normal.  Neck: Normal range of motion. Neck supple.  Cardiovascular: Normal rate and regular rhythm.  Respiratory: Effort normal and breath sounds normal.  GI: Soft. Bowel sounds are normal. She exhibits distension. There is no tenderness.  Incisions c/d/i  Musculoskeletal: Normal range of motion.  Neurological: She is alert and oriented to person, place, and time.  Skin: Skin  is warm and dry.  Psychiatric: She has a normal mood and affect. Her behavior is normal.     Assessment/Plan 47 yo female with abdominal pain and nausea and findings concerning for postoperative bowel obstruction. She does not have leukocytosis or peritoneal signs. -admit -bowel rest -IV fluids -empiric abx -pain control -possible NG tube placement -if does not improve may require laparoscopy or surgery  Mickeal Skinner, MD 10/13/2018, 1:09 AM

## 2018-10-13 NOTE — ED Provider Notes (Signed)
0015: Patient handed off to me by previous ED MD at shift change pending CT for disposition.  Please see previous note for full details.  Briefly, patient is a 47 year old female who presents for abdominal pain.  She had a perforated appendix in September which was managed with an abdominal drain.  She had laparoscopic appendectomy 10 days ago.  Presents to ER for nausea, vomiting, abdominal pain, decreased BMs and flatus.  Concern for ileus versus abscess versus SBO versus other surgical complication.    Physical Exam  BP 114/75   Pulse 77   Temp 97.9 F (36.6 C) (Oral)   SpO2 100%   Physical Exam  Constitutional: She is oriented to person, place, and time. She appears well-developed and well-nourished. No distress.  NAD.  HENT:  Head: Normocephalic and atraumatic.  Right Ear: External ear normal.  Left Ear: External ear normal.  Nose: Nose normal.  Eyes: Conjunctivae and EOM are normal. No scleral icterus.  Neck: Normal range of motion. Neck supple.  Cardiovascular: Normal rate, regular rhythm and normal heart sounds.  No murmur heard. Pulmonary/Chest: Effort normal and breath sounds normal. She has no wheezes.  Abdominal: Soft. Normal appearance. There is tenderness in the right lower quadrant, periumbilical area and suprapubic area. There is guarding.  No CVAT  Musculoskeletal: Normal range of motion. She exhibits no deformity.  Neurological: She is alert and oriented to person, place, and time.  Skin: Skin is warm and dry. Capillary refill takes less than 2 seconds.  Psychiatric: She has a normal mood and affect. Her behavior is normal. Judgment and thought content normal.  Nursing note and vitals reviewed.   ED Course/Procedures     Procedures  MDM   0025: Free air, dilated loops of SB with severe wall thickening most consistent with SBO with transition point at RLQ close to appendectomy site per radiologist. Will consult surgery.   [id number]: Dr Sheliah HatchKinsinger has seen pt and  will admit.     Liberty HandyGibbons, Barack Nicodemus J, PA-C 10/13/18 [id number]    Zadie RhineWickline, Donald, MD 10/14/18 [phone]

## 2018-10-13 NOTE — Progress Notes (Signed)
Pt denies nausea and vomiting  Pt scheduled for Reglan IV  Paged MD to inform pt denies symptoms and possibly move to PRN  MD stated to give as scheduled to assist with BMs

## 2018-10-13 NOTE — Progress Notes (Signed)
[facility] Progress Note     Subjective: CC-  Interpretor via telephone used. Patient states that she is feeling a little better, although she just received some pain medication so she thinks this is contributing. Denies n/v. No flatus or BM since admission.  Objective: Vital signs in last 24 hours: Temp:  [97.9 F (36.6 C)-98.6 F (37 C)] 98.6 F (37 C) (11/16 0458) Pulse Rate:  [65-89] 74 (11/16 0458) Resp:  [12-18] 18 (11/16 0458) BP: (102-117)/(68-76) 113/69 (11/16 0458) SpO2:  [97 %-100 %] 98 % (11/16 0345) Last BM Date: 10/11/18  Intake/Output from previous day: 11/15 0701 - 11/16 0700 In: 1327.5 [I.V.:227.5; IV Piggyback:1100] Out: -  Intake/Output this shift: No intake/output data recorded.  PE: Gen:  Alert, NAD, pleasant HEENT: EOM's intact, pupils equal and round Card:  RRR Pulm:  CTAB, no W/R/R, effort normal Abd: well healed lap incisions, soft, mild distension, +BS, nontender Skin: no rashes noted, warm and dry  Lab Results:  Recent Labs    10/12/18 2223 10/13/18 0242  WBC 7.9 6.1  HGB 12.0 10.6*  HCT 37.4 34.6*  PLT 295 287   BMET Recent Labs    10/12/18 2223 10/13/18 0242  NA 137 137  K 4.6 4.3  CL 99 105  CO2 28 25  GLUCOSE 109* 127*  BUN 8 7  CREATININE 0.67 0.74  CALCIUM 9.3 8.8*   PT/INR No results for input(s): LABPROT, INR in the last 72 hours. CMP     Component Value Date/Time   NA 137 10/13/2018 0242   NA 138 05/15/2018 0954   K 4.3 10/13/2018 0242   CL 105 10/13/2018 0242   CO2 25 10/13/2018 0242   GLUCOSE 127 (H) 10/13/2018 0242   BUN 7 10/13/2018 0242   BUN 10 05/15/2018 0954   CREATININE 0.74 10/13/2018 0242   CALCIUM 8.8 (L) 10/13/2018 0242   PROT 6.5 10/13/2018 0242   PROT 7.2 05/15/2018 0954   ALBUMIN 3.2 (L) 10/13/2018 0242   ALBUMIN 4.1 05/15/2018 0954   AST 15 10/13/2018 0242   ALT 11 10/13/2018 0242   ALKPHOS 51 10/13/2018 0242   BILITOT 0.3 10/13/2018 0242   BILITOT 0.4 05/15/2018  0954   GFRNONAA >60 10/13/2018 0242   GFRAA >60 10/13/2018 0242   Lipase     Component Value Date/Time   LIPASE 34 10/12/2018 2223       Studies/Results: Ct Abdomen Pelvis W Contrast  Result Date: 10/13/2018 CLINICAL DATA:  Centralized abdominal pain with nausea. EXAM: CT ABDOMEN AND PELVIS WITH CONTRAST TECHNIQUE: Multidetector CT imaging of the abdomen and pelvis was performed using the standard protocol following bolus administration of intravenous contrast. CONTRAST:  100mL OMNIPAQUE IOHEXOL 300 MG/ML  SOLN COMPARISON:  09/21/2018 FINDINGS: Lower chest: No acute abnormality. Hepatobiliary: No focal liver abnormality is seen. No gallstones, gallbladder wall thickening, or biliary dilatation. Pancreas: Unremarkable. No pancreatic ductal dilatation or surrounding inflammatory changes. Spleen: Normal in size without focal abnormality. Adrenals/Urinary Tract: Adrenal glands are unremarkable. Kidneys are normal, without renal calculi, focal lesion, or hydronephrosis. Bladder is unremarkable. Stomach/Bowel: Fluid in the stomach. Multiple dilated loops of small bowel measuring up to 3 cm in the mid abdomen with loops of severe bowel wall thickening and surrounding inflammatory changes. Relative transition point in the right mid lower abdomen at the site of recent surgery (image 51/series 3). Small volume pneumoperitoneum. No portal venous gas. Vascular/Lymphatic: No significant vascular findings are present. No enlarged abdominal or pelvic lymph nodes. Reproductive: Uterus  and bilateral adnexa are unremarkable. Other: No abdominal wall hernia or abnormality. No abdominopelvic ascites. Musculoskeletal: No acute or significant osseous findings. IMPRESSION: 1. Interval appendectomy. Multiple dilated loops of small bowel measuring up to 3 cm in the mid abdomen with loops of severe bowel wall thickening and surrounding inflammatory changes most concerning for bowel obstruction with a relative transition  point in the right mid lower abdomen at the site of recent surgery (image 51/series 3). Small volume pneumoperitoneum concerning for perforated viscus versus secondary to appendectomy 10 days ago. Critical Value/emergent results were called by telephone at the time of interpretation on 10/13/2018 at 12:21 am to [facility], who verbally acknowledged these results. Electronically Signed   By: Elige Ko   On: 10/13/2018 00:25    Anti-infectives: Anti-infectives (From admission, onward)   Start     Dose/Rate Route Frequency Ordered Stop   10/13/18 0200  Ampicillin-Sulbactam (UNASYN) 3 g in sodium chloride 0.9 % 100 mL IVPB     3 g 200 mL/hr over 30 Minutes Intravenous Every 6 hours 10/13/18 0114         Assessment/Plan S/p interval appendectomy 10/03/18 Dr. Sheliah Hatch after perforated appendicitis in 07/2018 Postop abdominal pain, nausea - CT scan concerning for possible bowel obstruction - VSS, WBC WNL, and patient nontender on abdominal exam this morning. Continue bowel rest and empiric abx. NG tube if she vomits. Ambulate. If does not improve may require laparoscopy or surgery.  ID - unasyn 11/16>> FEN - IVF, NPO VTE - SCDs, lovenox Foley - none   LOS: 0 days    Franne Forts , [facility] 10/13/2018, 8:19 AM Pager: [phone] Mon 7:00 am -11:30 AM Tues-Fri 7:00 am-4:30 pm Sat-Sun 7:00 am-11:30 am

## 2018-10-13 NOTE — Progress Notes (Signed)
Used the interpreter to complete pt admission database  Pt reports no nausea or vomiting and no pain  Educated pt on IV antibiotics, pt understanding  Will continue to monitor

## 2018-10-13 NOTE — ED Notes (Signed)
Patient transported to CT 

## 2018-10-14 ENCOUNTER — Inpatient Hospital Stay (HOSPITAL_COMMUNITY): Payer: BLUE CROSS/BLUE SHIELD

## 2018-10-14 LAB — CBC
HEMATOCRIT: 32.4 % — AB (ref 36.0–46.0)
Hemoglobin: 10.3 g/dL — ABNORMAL LOW (ref 12.0–15.0)
MCH: 27.7 pg (ref 26.0–34.0)
MCHC: 31.8 g/dL (ref 30.0–36.0)
MCV: 87.1 fL (ref 80.0–100.0)
NRBC: 0 % (ref 0.0–0.2)
Platelets: 255 10*3/uL (ref 150–400)
RBC: 3.72 MIL/uL — AB (ref 3.87–5.11)
RDW: 12.3 % (ref 11.5–15.5)
WBC: 5 10*3/uL (ref 4.0–10.5)

## 2018-10-14 LAB — COMPREHENSIVE METABOLIC PANEL
ALT: 8 U/L (ref 0–44)
AST: 12 U/L — ABNORMAL LOW (ref 15–41)
Albumin: 3 g/dL — ABNORMAL LOW (ref 3.5–5.0)
Alkaline Phosphatase: 50 U/L (ref 38–126)
Anion gap: 7 (ref 5–15)
BILIRUBIN TOTAL: 0.4 mg/dL (ref 0.3–1.2)
BUN: <5 mg/dL — ABNORMAL LOW (ref 6–20)
CO2: 27 mmol/L (ref 22–32)
Calcium: 8.9 mg/dL (ref 8.9–10.3)
Chloride: 104 mmol/L (ref 98–111)
Creatinine, Ser: 0.68 mg/dL (ref 0.44–1.00)
Glucose, Bld: 106 mg/dL — ABNORMAL HIGH (ref 70–99)
Potassium: 3.8 mmol/L (ref 3.5–5.1)
Sodium: 138 mmol/L (ref 135–145)
Total Protein: 5.9 g/dL — ABNORMAL LOW (ref 6.5–8.1)

## 2018-10-14 MED ORDER — MENTHOL 3 MG MT LOZG
1.0000 | LOZENGE | OROMUCOSAL | Status: DC | PRN
  Filled 2018-10-14: qty 9

## 2018-10-14 MED ORDER — PIPERACILLIN-TAZOBACTAM 3.375 G IVPB
3.3750 g | Freq: Three times a day (TID) | INTRAVENOUS | Status: DC
  Administered 2018-10-14 – 2018-10-16 (×6): 3.375 g via INTRAVENOUS
  Filled 2018-10-14 (×7): qty 50

## 2018-10-14 MED ORDER — HYDROCORTISONE 2.5 % RE CREA
1.0000 "application " | TOPICAL_CREAM | Freq: Four times a day (QID) | RECTAL | Status: DC | PRN
  Filled 2018-10-14: qty 28.35

## 2018-10-14 MED ORDER — PHENOL 1.4 % MT LIQD: 1.0000 | OROMUCOSAL | Status: DC | PRN

## 2018-10-14 MED ORDER — LIP MEDEX EX OINT
1.0000 "application " | TOPICAL_OINTMENT | Freq: Two times a day (BID) | CUTANEOUS | Status: DC
  Administered 2018-10-14 – 2018-10-15 (×3): 1 via TOPICAL
  Filled 2018-10-14: qty 7

## 2018-10-14 MED ORDER — LACTATED RINGERS IV BOLUS
1000.0000 mL | Freq: Once | INTRAVENOUS | Status: AC
  Administered 2018-10-14: 1000 mL via INTRAVENOUS

## 2018-10-14 MED ORDER — HYDROCORTISONE 1 % EX CREA
1.0000 "application " | TOPICAL_CREAM | Freq: Three times a day (TID) | CUTANEOUS | Status: DC | PRN
  Filled 2018-10-14: qty 28

## 2018-10-14 MED ORDER — GUAIFENESIN-DM 100-10 MG/5ML PO SYRP: 10.0000 mL | ORAL_SOLUTION | ORAL | Status: DC | PRN

## 2018-10-14 MED ORDER — ALUM & MAG HYDROXIDE-SIMETH 200-200-20 MG/5ML PO SUSP: 30.0000 mL | Freq: Four times a day (QID) | ORAL | Status: DC | PRN

## 2018-10-14 MED ORDER — BISACODYL 10 MG RE SUPP: 10.0000 mg | Freq: Two times a day (BID) | RECTAL | Status: DC | PRN

## 2018-10-14 MED ORDER — DIPHENHYDRAMINE HCL 50 MG/ML IJ SOLN: 12.5000 mg | Freq: Four times a day (QID) | INTRAMUSCULAR | Status: DC | PRN

## 2018-10-14 MED ORDER — MAGIC MOUTHWASH
15.0000 mL | Freq: Four times a day (QID) | ORAL | Status: DC | PRN
  Filled 2018-10-14: qty 15

## 2018-10-14 MED ORDER — MORPHINE SULFATE (PF) 2 MG/ML IV SOLN
2.0000 mg | INTRAVENOUS | Status: DC | PRN
  Administered 2018-10-14 (×2): 4 mg via INTRAVENOUS
  Filled 2018-10-14: qty 1
  Filled 2018-10-14: qty 2

## 2018-10-14 MED ORDER — SIMETHICONE 40 MG/0.6ML PO SUSP
40.0000 mg | Freq: Four times a day (QID) | ORAL | Status: DC
  Administered 2018-10-14 – 2018-10-16 (×8): 40 mg via ORAL
  Filled 2018-10-14 (×10): qty 0.6

## 2018-10-14 MED ORDER — POLYETHYLENE GLYCOL 3350 17 G PO PACK: 17.0000 g | PACK | Freq: Every day | ORAL | Status: DC

## 2018-10-14 NOTE — Progress Notes (Signed)
[facility] Progress Note     Subjective: CC-  Patient states that she had another episode of severe, crampy, central abdominal pain last night requiring pain medication. Feeling ok right now, but she has already taken 4mg  morphine twice this morning. Denies n/v. Passed small amount of flatus yesterday. No BM. Started on reglan yesterday.   Objective: Vital signs in last 24 hours: Temp:  [98 F (36.7 C)-98.5 F (36.9 C)] 98.1 F (36.7 C) (11/17 0553) Pulse Rate:  [70-83] 76 (11/17 0553) Resp:  [16-18] 18 (11/17 0553) BP: (102-115)/(63-76) 107/76 (11/17 0553) SpO2:  [98 %-100 %] 100 % (11/17 0553) Weight:  [57.1 kg] 57.1 kg (11/17 0553) Last BM Date: 10/11/18  Intake/Output from previous day: 11/16 0701 - 11/17 0700 In: 1240.6 [P.O.:360; I.V.:680.6; IV Piggyback:200] Out: 1100 [Urine:1100] Intake/Output this shift: No intake/output data recorded.  PE: Gen:  Alert, NAD, pleasant HEENT: EOM's intact, pupils equal and round Card:  RRR Pulm:  CTAB, no W/R/R, effort normal Abd: well healed lap incisions, soft, mild distension, +BS, nontender Skin: no rashes noted, warm and dry  Lab Results:  Recent Labs    10/13/18 0242 10/14/18 0608  WBC 6.1 5.0  HGB 10.6* 10.3*  HCT 34.6* 32.4*  PLT 287 255   BMET Recent Labs    10/13/18 0242 10/14/18 0608  NA 137 138  K 4.3 3.8  CL 105 104  CO2 25 27  GLUCOSE 127* 106*  BUN 7 <5*  CREATININE 0.74 0.68  CALCIUM 8.8* 8.9   PT/INR No results for input(s): LABPROT, INR in the last 72 hours. CMP     Component Value Date/Time   NA 138 10/14/2018 0608   NA 138 05/15/2018 0954   K 3.8 10/14/2018 0608   CL 104 10/14/2018 0608   CO2 27 10/14/2018 0608   GLUCOSE 106 (H) 10/14/2018 0608   BUN <5 (L) 10/14/2018 0608   BUN 10 05/15/2018 0954   CREATININE 0.68 10/14/2018 0608   CALCIUM 8.9 10/14/2018 0608   PROT 5.9 (L) 10/14/2018 0608   PROT 7.2 05/15/2018 0954   ALBUMIN 3.0 (L) 10/14/2018 0608   ALBUMIN  4.1 05/15/2018 0954   AST 12 (L) 10/14/2018 0608   ALT 8 10/14/2018 0608   ALKPHOS 50 10/14/2018 0608   BILITOT 0.4 10/14/2018 0608   BILITOT 0.4 05/15/2018 0954   GFRNONAA >60 10/14/2018 0608   GFRAA >60 10/14/2018 0608   Lipase     Component Value Date/Time   LIPASE 34 10/12/2018 2223       Studies/Results: Ct Abdomen Pelvis W Contrast  Result Date: 10/13/2018 CLINICAL DATA:  Centralized abdominal pain with nausea. EXAM: CT ABDOMEN AND PELVIS WITH CONTRAST TECHNIQUE: Multidetector CT imaging of the abdomen and pelvis was performed using the standard protocol following bolus administration of intravenous contrast. CONTRAST:  OMNIPAQUE IOHEXOL 300 MG/ML  SOLN COMPARISON:  09/21/2018 FINDINGS: Lower chest: No acute abnormality. Hepatobiliary: No focal liver abnormality is seen. No gallstones, gallbladder wall thickening, or biliary dilatation. Pancreas: Unremarkable. No pancreatic ductal dilatation or surrounding inflammatory changes. Spleen: Normal in size without focal abnormality. Adrenals/Urinary Tract: Adrenal glands are unremarkable. Kidneys are normal, without renal calculi, focal lesion, or hydronephrosis. Bladder is unremarkable. Stomach/Bowel: Fluid in the stomach. Multiple dilated loops of small bowel measuring up to 3 cm in the mid abdomen with loops of severe bowel wall thickening and surrounding inflammatory changes. Relative transition point in the right mid lower abdomen at the site of recent surgery (image 51/series  3). Small volume pneumoperitoneum. No portal venous gas. Vascular/Lymphatic: No significant vascular findings are present. No enlarged abdominal or pelvic lymph nodes. Reproductive: Uterus and bilateral adnexa are unremarkable. Other: No abdominal wall hernia or abnormality. No abdominopelvic ascites. Musculoskeletal: No acute or significant osseous findings. IMPRESSION: 1. Interval appendectomy. Multiple dilated loops of small bowel measuring up to 3 cm in the  mid abdomen with loops of severe bowel wall thickening and surrounding inflammatory changes most concerning for bowel obstruction with a relative transition point in the right mid lower abdomen at the site of recent surgery (image 51/series 3). Small volume pneumoperitoneum concerning for perforated viscus versus secondary to appendectomy 10 days ago. Critical Value/emergent results were called by telephone at the time of interpretation on 10/13/2018 at 12:21 am to [facility], who verbally acknowledged these results. Electronically Signed   By: Elige KoHetal  Patel   On: 10/13/2018 00:25    Anti-infectives: Anti-infectives (From admission, onward)   Start     Dose/Rate Route Frequency Ordered Stop   10/13/18 0200  Ampicillin-Sulbactam (UNASYN) 3 g in sodium chloride 0.9 % 100 mL IVPB     3 g 200 mL/hr over 30 Minutes Intravenous Every 6 hours 10/13/18 0114         Assessment/Plan S/p interval appendectomy 10/03/18 Dr. Sheliah HatchKinsinger after perforated appendicitis in 07/2018 Postop abdominal pain, nausea - CT scan concerning for possible bowel obstruction - Patient with persistent, intermittent abdominal pain requiring IV morphine. No n/v and currently nontender on exam. VSS, WBC WNL. Continue bowel rest and empiric abx. Abdominal film pending. Will discuss with MD.  ID - unasyn 11/16>> FEN - IVF, NPO VTE - SCDs, lovenox Foley - none   LOS: 1 day    Franne FortsBrooke A Meuth , [facility] 10/14/2018, 9:11 AM Pager: [phone] Mon 7:00 am -11:30 AM Tues-Fri 7:00 am-4:30 pm Sat-Sun 7:00 am-11:30 am

## 2018-10-14 NOTE — Progress Notes (Signed)
Pharmacy Antibiotic Note  Sierra Hensley is a 47 y.o. female admitted on 10/12/2018 with possible intraabdominal infection s/p interval appendectomy after perforated appendicitis.  Pharmacy has been consulted for Zosyn dosing.  Plan: Zosyn 3.375g IV q8h (4 hour infusion).  Height: 5\' 4"  (162.6 cm) Weight: 125 lb 14.4 oz (57.1 kg) IBW/kg (Calculated) : 54.7  Temp (24hrs), Avg:98.1 F (36.7 C), Min:98 F (36.7 C), Max:98.5 F (36.9 C)  Recent Labs  Lab 10/12/18 2223 10/12/18 2238 10/13/18 0242 10/14/18 0608  WBC 7.9  --  6.1 5.0  CREATININE 0.67  --  0.74 0.68  LATICACIDVEN  --  1.14  --   --     Estimated Creatinine Clearance: 75.9 mL/min (by C-G formula based on SCr of 0.68 mg/dL).    No Known Allergies  Antimicrobials this admission: 11/16 Unasyn>11/17 11/17 Zosyn>  Dose adjustments this admission: none  Microbiology results: none  Thank you for allowing pharmacy to be a part of this patient's care.   Harlow MaresAmy Norlan Rann, PharmD PGY1 Pharmacy Resident Phone [phone]  10/14/2018   12:54 PM

## 2018-10-15 LAB — CBC
HCT: 31.8 % — ABNORMAL LOW (ref 36.0–46.0)
HEMOGLOBIN: 10 g/dL — AB (ref 12.0–15.0)
MCH: 27.1 pg (ref 26.0–34.0)
MCHC: 31.4 g/dL (ref 30.0–36.0)
MCV: 86.2 fL (ref 80.0–100.0)
Platelets: 272 10*3/uL (ref 150–400)
RBC: 3.69 MIL/uL — ABNORMAL LOW (ref 3.87–5.11)
RDW: 12.1 % (ref 11.5–15.5)
WBC: 4.1 10*3/uL (ref 4.0–10.5)
nRBC: 0 % (ref 0.0–0.2)

## 2018-10-15 LAB — COMPREHENSIVE METABOLIC PANEL WITH GFR
ALT: 8 U/L (ref 0–44)
AST: 12 U/L — ABNORMAL LOW (ref 15–41)
Albumin: 3 g/dL — ABNORMAL LOW (ref 3.5–5.0)
Alkaline Phosphatase: 47 U/L (ref 38–126)
Anion gap: 6 (ref 5–15)
BUN: <5 mg/dL — ABNORMAL LOW (ref 6–20)
CO2: 27 mmol/L (ref 22–32)
Calcium: 8.7 mg/dL — ABNORMAL LOW (ref 8.9–10.3)
Chloride: 105 mmol/L (ref 98–111)
Creatinine, Ser: 0.65 mg/dL (ref 0.44–1.00)
GFR calc Af Amer: >60 mL/min
GFR calc non Af Amer: >60 mL/min
Glucose, Bld: 96 mg/dL (ref 70–99)
Potassium: 3.4 mmol/L — ABNORMAL LOW (ref 3.5–5.1)
Sodium: 138 mmol/L (ref 135–145)
Total Bilirubin: 0.5 mg/dL (ref 0.3–1.2)
Total Protein: 5.9 g/dL — ABNORMAL LOW (ref 6.5–8.1)

## 2018-10-15 NOTE — Progress Notes (Signed)
  Progress Note: General Surgery Service   Assessment/Plan: Active Problems:   Small bowel obstruction (HCC) 47 yo female s/p lap appy who presented with abdominal pain concerning for SBO. Multiple BMs yesterday, pain only intermittent -advance diet -continue abx    LOS: 2 days  Chief Complaint/Subjective: Pain only sometimes and twisting feeling, +BMs, no nausea or vomiting  Objective: Vital signs in last 24 hours: Temp:  [97.9 F (36.6 C)-98 F (36.7 C)] 98 F (36.7 C) (11/18 0513) Pulse Rate:  [70-73] 73 (11/18 0513) Resp:  [12-20] 20 (11/18 0513) BP: (108-112)/(74-75) 111/74 (11/18 0513) SpO2:  [98 %-100 %] 98 % (11/18 0513) Weight:  [56.8 kg] 56.8 kg (11/18 0513) Last BM Date: 10/14/18(x4)  Intake/Output from previous day: 11/17 0701 - 11/18 0700 In: 2583.8 [P.O.:340; I.V.:889.5; IV Piggyback:1354.3] Out: 2 [Urine:2] Intake/Output this shift: Total I/O In: 350 [I.V.:300; IV Piggyback:50] Out: -   Lungs: nonlabored breathing  Cardiovascular: RRR  Abd: soft, NT, ND  Extremities: no edema  Neuro: AOx4  Lab Results: CBC  Recent Labs    10/14/18 0608 10/15/18 0503  WBC 5.0 4.1  HGB 10.3* 10.0*  HCT 32.4* 31.8*  PLT 255 272   BMET Recent Labs    10/14/18 0608 10/15/18 0503  NA 138 138  K 3.8 3.4*  CL 104 105  CO2 27 27  GLUCOSE 106* 96  BUN <5* <5*  CREATININE 0.68 0.65  CALCIUM 8.9 8.7*   PT/INR No results for input(s): LABPROT, INR in the last 72 hours. ABG No results for input(s): PHART, HCO3 in the last 72 hours.  Invalid input(s): PCO2, PO2  Studies/Results:  Anti-infectives: Anti-infectives (From admission, onward)   Start     Dose/Rate Route Frequency Ordered Stop   10/14/18 1300  piperacillin-tazobactam (ZOSYN) IVPB 3.375 g     3.375 g 12.5 mL/hr over 240 Minutes Intravenous Every 8 hours 10/14/18 1251     10/13/18 0200  Ampicillin-Sulbactam (UNASYN) 3 g in sodium chloride 0.9 % 100 mL IVPB  Status:  Discontinued     3  g 200 mL/hr over 30 Minutes Intravenous Every 6 hours 10/13/18 0114 10/14/18 1132      Medications: Scheduled Meds: . enoxaparin (LOVENOX) injection  40 mg Subcutaneous Q24H  . lip balm  1 application Topical BID  . metoCLOPramide (REGLAN) injection  10 mg Intravenous Q6H  . simethicone  40 mg Oral QID   Continuous Infusions: . dextrose 5 % and 0.45% NaCl 75 mL/hr at 10/14/18 0430  . piperacillin-tazobactam (ZOSYN)  IV 3.375 g (10/15/18 0715)   PRN Meds:.alum & mag hydroxide-simeth, bisacodyl, diphenhydrAMINE, guaiFENesin-dextromethorphan, hydrocortisone, hydrocortisone cream, Influenza vac split quadrivalent PF, magic mouthwash, menthol-cetylpyridinium, metoprolol tartrate, morphine injection, ondansetron **OR** ondansetron (ZOFRAN) IV, oxyCODONE, phenol  Rodman PickleLuke Aaron Kinsinger, MD Pg# [phone] [facility]

## 2018-10-16 LAB — CBC
HCT: 32.9 % — ABNORMAL LOW (ref 36.0–46.0)
HEMOGLOBIN: 10.5 g/dL — AB (ref 12.0–15.0)
MCH: 27.6 pg (ref 26.0–34.0)
MCHC: 31.9 g/dL (ref 30.0–36.0)
MCV: 86.4 fL (ref 80.0–100.0)
PLATELETS: 289 10*3/uL (ref 150–400)
RBC: 3.81 MIL/uL — AB (ref 3.87–5.11)
RDW: 12.2 % (ref 11.5–15.5)
WBC: 3.9 10*3/uL — ABNORMAL LOW (ref 4.0–10.5)
nRBC: 0 % (ref 0.0–0.2)

## 2018-10-16 LAB — COMPREHENSIVE METABOLIC PANEL
ALT: 12 U/L (ref 0–44)
ANION GAP: 8 (ref 5–15)
AST: 20 U/L (ref 15–41)
Albumin: 3.3 g/dL — ABNORMAL LOW (ref 3.5–5.0)
Alkaline Phosphatase: 50 U/L (ref 38–126)
BUN: 5 mg/dL — ABNORMAL LOW (ref 6–20)
CALCIUM: 9 mg/dL (ref 8.9–10.3)
CO2: 26 mmol/L (ref 22–32)
Chloride: 103 mmol/L (ref 98–111)
Creatinine, Ser: 0.81 mg/dL (ref 0.44–1.00)
GFR calc non Af Amer: >60 mL/min (ref >60)
Glucose, Bld: 94 mg/dL (ref 70–99)
POTASSIUM: 3.9 mmol/L (ref 3.5–5.1)
SODIUM: 137 mmol/L (ref 135–145)
Total Bilirubin: 0.5 mg/dL (ref 0.3–1.2)
Total Protein: 6.8 g/dL (ref 6.5–8.1)

## 2018-10-16 MED ORDER — AMOXICILLIN-POT CLAVULANATE 875-125 MG PO TABS: 1.0000 | ORAL_TABLET | Freq: Two times a day (BID) | ORAL | 0 refills | Status: DC

## 2018-10-16 NOTE — Progress Notes (Signed)
Refused FLu vaccine.

## 2018-10-16 NOTE — Discharge Summary (Signed)
Physician Discharge Summary  Patient ID: Sierra DuncansKim H Stoneman MRN: [id number] DOB/AGE: 46/05/1971 46 y.o.  Admit date: 10/12/2018 Discharge date: 10/16/2018  Admission Diagnoses:  Discharge Diagnoses:  Active Problems:   Small bowel obstruction [facility](HCC)   Discharged Condition: good  Hospital Course: 47 yo female presented with nausea and abdominal pain. CT scan concerning for SBO. She was admitted and never required NG tube placement. Her pain slowly improved and once reglan was added it seemed to completely resolve  Consults: None  Significant Diagnostic Studies:  CBC    Component Value Date/Time   WBC 4.8 10/18/2018 0921   RBC 3.51 (L) 10/18/2018 0921   HGB 9.7 (L) 10/18/2018 0921   HGB 12.2 05/15/2018 0954   HCT 31.2 (L) 10/18/2018 0921   HCT 36.8 05/15/2018 0954   PLT 275 10/18/2018 0921   PLT 238 05/15/2018 0954   MCV 88.9 10/18/2018 0921   MCV 84.3 08/03/2018 1418   MCV 84 05/15/2018 0954   MCH 27.6 10/18/2018 0921   MCHC 31.1 10/18/2018 0921   RDW 12.5 10/18/2018 0921   RDW 15.5 (H) 05/15/2018 0954   LYMPHSABS 2.0 10/17/2018 2356   LYMPHSABS 1.3 05/15/2018 0954   MONOABS 0.4 10/17/2018 2356   EOSABS 0.2 10/17/2018 2356   EOSABS 0.3 05/15/2018 0954   BASOSABS 0.0 10/17/2018 2356   BASOSABS 0.0 05/15/2018 0954     Treatments: hydration, pain control  Discharge Exam: Blood pressure 116/72, pulse 64, temperature 97.8 F (36.6 C), temperature source Oral, resp. rate 20, height 5\' 4"  (1.626 m), weight 55.6 kg, SpO2 100 %. General appearance: alert and cooperative Head: Normocephalic, without obvious abnormality, atraumatic Neck: no adenopathy, no carotid bruit, no JVD, supple, symmetrical, trachea midline and thyroid not enlarged, symmetric, no tenderness/mass/nodules Resp: clear to auscultation bilaterally Cardio: regular rate and rhythm, S1, S2 normal, no murmur, click, rub or gallop GI: soft, non-tender; bowel sounds normal; no masses,  no  organomegaly  Disposition: Discharge disposition: 01-Home or Self Care       Discharge Instructions    Call MD for:  persistant nausea and vomiting   Complete by:  As directed    Call MD for:  severe uncontrolled pain   Complete by:  As directed    Call MD for:  temperature >100.4   Complete by:  As directed    Diet - low sodium heart healthy   Complete by:  As directed    Increase activity slowly   Complete by:  As directed      Allergies as of 10/16/2018   No Known Allergies     Medication List    TAKE these medications   amoxicillin-clavulanate 875-125 MG tablet Commonly known as:  AUGMENTIN Take 1 tablet by mouth every 12 (twelve) hours.   HYDROcodone-acetaminophen 5-325 MG tablet Commonly known as:  NORCO/VICODIN Take 1 tablet by mouth every 6 (six) hours as needed for moderate pain.   ibuprofen 800 MG tablet Commonly known as:  ADVIL,MOTRIN Take 1 tablet (800 mg total) by mouth every 8 (eight) hours as needed.      Follow-up Information    , De BlanchLuke Aaron, MD Follow up in 2 week(s).   Specialty:  General Surgery Contact information: [address] [place] [address] [phone]           Signed: De BlanchLuke Aaron  10/16/2018, 7:23 AM

## 2018-10-16 NOTE — Plan of Care (Signed)
Patient is receiving antibiotics. She is aware of her treatment course. Will continue to monitor for educational needs.

## 2018-10-16 NOTE — Progress Notes (Signed)
Discharged to home. D/c instructions and follow up appointments discussed with patient, verbalized understanding. PIV removed no s/sx of infiltration or swelling noted.

## 2018-10-16 NOTE — Discharge Instructions (Signed)
T?c ru?t non Small Bowel Obstruction T?c ru?t non l m?t ch? t?c trong ru?t non. Ru?t non l m?t ?ng di, m?m n?i d? dy v?i ru?t gi. Khi m?t ng??i ?n v u?ng, th?c ?n v th?c u?ng ?i t? d? dy xu?ng ru?t non. ?y l n?i h?u h?t cc ch?t dinh d??ng c trong th?c ?n v th?c u?ng ???c h?p th?. T?c ru?t non s? ng?n khng cho th?c ?n v th?c u?ng ?i qua ru?t non nh? bnh th??ng trong qu trnh tiu ha. Ru?t non c th? b? t?c m?t ph?n hay t?c hon ton. Tnh tr?ng ny c th? gy cc tri?u ch?ng nh? ?au b?ng, nn, v ch??ng b?ng. N?u tnh tr?ng ny khng ???c ?i?u tr?, n c th? nguy hi?m v ru?t non c th? b? rch. Nguyn nhn g gy ra? Nguyn nhn ph? bi?n gy ra tnh tr?ng ny bao g?m:  M s?o t? l?n ph?u thu?t ho?c x? tr? tr??c ?y.  C ph?u thu?t g?n ?y. Tnh tr?ng ny lm nhu ??ng ru?t ch?m l?i v lm th?c ?n t?c trong ru?t.  Thot v?.  B?nh vim ru?t (vim ??i trng).  Xo?n ru?t (ch?ng xo?n ru?t).  Kh?i u.  M?t d? v?t.  L?ng m?t ph?n ru?t vo m?t ph?n khc (b?nh l?ng ru?t).  Cc d?u hi?u ho?c tri?u ch?ng l g? Nh?ng tri?u ch?ng c?a tnh tr?ng ny bao g?m:  ?au b?ng. N c th? l cc c?n co th?t m ? ho?c ?au nhi. N c th? x?y ra ? m?t vng, ho?c ? ton b? ph?n b?ng. C?n ?au c th? t? nh? cho ??n n?ng, ty thu?c vo m?c ?? t?c.  Bu?n nn v nn. Ch?t nn ra c th? c mu h?i xanh ho?c mu m?t vng.  Ch??ng b?ng.  To bn.  Khng ?nh h?i ???c.  Th??ng xuyn ? h?i.  Tiu ch?y. Vi?c ny c th? x?y ra n?u tnh tr?ng t?c l m?t ph?n v phn l?ng c th? r? qua ch? t?c.  Ch?n ?on tnh tr?ng ny nh? th? no? B?nh ny c th? ???c ch?n ?on d?a vo khm th?c th?, khai thc b?nh s? v ch?p X quang b?ng. Qu v? c?ng c th? ph?i lm cc ki?m tra khc, ch?ng h?n nh? ch?p CT b?ng v khung ch?u. Tnh tr?ng ny ???c ?i?u tr? nh? th? no? Vi?c ?i?u tr? b?nh ny ty thu?c vo nguyn nhn v m?c ?? n?ng c?a b?nh. Cc ph??ng n ?i?u tr? c th? bao g?m:  N?m ngh? trn gi??ng, ??ng  th?i d?ch v thu?c gi?m ?au ???c truy?n qua m?t ???ng truy?n t?nh m?ch lu?n vo m?t trong cc t?nh m?ch c?a qu v?. ?i khi ?y l t?t c? nh?ng g c?n thi?t ?? c?i thi?n tnh tr?ng t?c.  Tun th? ch? ?? ?n ??n gi?n. Trong m?t s? tr??ng h?p, ch? ?? ?n l?ng, trong c th? c?n thi?t trong vi ngy. Vi?c ny cho php ru?t ngh? ng?i.  ??t m?t ?ng nh? (?ng thng m?i-d? dy) vo d? dy. Khi ru?t b? t?c, n th??ng ph?ng ln nh? m?t qu? bng ch?a ??y khng kh v ch?t l?ng. Khng kh v ch?t l?ng c th? ???c lo?i b? b?ng cch ht qua ?ng thng m?i-d? dy. Vi?c ny c th? gip gi?m ?au, gi?m c?m gic kh ch?u v gi?m bu?n nn. N c?ng c th? gip ch? t?c thng nhanh h?n.  Ph?u thu?t. Ph?u thu?t c th? c?n thi?t n?u cc bi?n php ?i?u tr? khc khng  c tc d?ng. T?c ru?t do m?t thot v? c th? c?n ph?u thu?t s?m v c th? l m?t th? thu?t c?p c?u. Ph?u thu?t c?ng c th? c?n ??i v?i m s?o gy t?c th??ng xuyn ho?c nghim tr?ng.  Tun th? nh?ng h??ng d?n ny ? nh:  Ngh? ng?i th?t nhi?u.  Tun th? ch? ??n c?a chuyn gia ch?m [place] s?c kh?e v? vi?c h?n ch? ?n u?ng. Qu v? c th? c?n trnh cc th?c ?n r?n v ch? dng ?? l?ng, trong cho ??n khi tnh tr?ng c?a qu v? ???c c?i thi?n.  Ch? s? d?ng thu?c khng k ??n v thu?c k ??n theo ch? d?n c?a chuyn gia ch?m [place] s?c kh?e.  Tun th? t?t c? cc l?n khm theo di theo ch? d?n c?a chuyn gia ch?m [place] s?c kh?e. ?i?u ny c vai tr quan tr?ng. Hy lin l?c v?i chuyn gia ch?m [place] s?c kh?e n?u:  Qu v? b? s?t.  Qu v? b? ?n l?nh. Yu c?u tr? gip ngay l?p t?c n?u:  Qu v? b? ?au ho?c co th?t t?ng ln.  Qu v? nn ra mu.  Qu v? b? nn ho?c bu?n nn khng ki?m sot ???c.  Qu v? khng th? u?ng ?? u?ng l?ng v nn ho?c ?au.  Qu v? b? l l?n.  Qu v? b?t ??u c?m th?y r?t kh ho?c kht (m?t n??c).  Qu v? b? ch??ng b?ng r?t nhi?u.  Qu v? c?m th?y r?t y?u ho?c qu v? b? ng?t. Thng tin ny khng nh?m m?c ?ch thay th? cho l?i khuyn m chuyn gia ch?m  [place] s?c kh?e ni v?i qu v?. Hy b?o ??m qu v? ph?i th?o lu?n b?t k? v?n ?? g m qu v? c v?i chuyn gia ch?m [place] s?c kh?e c?a qu v?. Document Released: 03/11/2011 Document Revised: 03/02/2017 Document Reviewed: 01/08/2015 Elsevier Interactive Patient Education  2018 [employer]

## 2018-10-17 ENCOUNTER — Inpatient Hospital Stay (HOSPITAL_COMMUNITY)
Admission: EM | Admit: 2018-10-17 | Discharge: 2018-10-19 | Disposition: A | Discharge status: Home / Self Care | Payer: BLUE CROSS/BLUE SHIELD | Source: Home / Self Care | Attending: General Surgery | Admitting: General Surgery

## 2018-10-17 ENCOUNTER — Encounter (HOSPITAL_COMMUNITY): Payer: Self-pay | Admitting: *Deleted

## 2018-10-17 ENCOUNTER — Other Ambulatory Visit: Payer: Self-pay

## 2018-10-17 DIAGNOSIS — K56600 Partial intestinal obstruction, unspecified as to cause: Secondary | ICD-10-CM | POA: Diagnosis present

## 2018-10-17 DIAGNOSIS — Z79899 Other long term (current) drug therapy: Secondary | ICD-10-CM

## 2018-10-17 DIAGNOSIS — Z791 Long term (current) use of non-steroidal anti-inflammatories (NSAID): Secondary | ICD-10-CM

## 2018-10-17 DIAGNOSIS — R109 Unspecified abdominal pain: Secondary | ICD-10-CM

## 2018-10-17 DIAGNOSIS — K56609 Unspecified intestinal obstruction, unspecified as to partial versus complete obstruction: Secondary | ICD-10-CM | POA: Diagnosis present

## 2018-10-17 NOTE — ED Provider Notes (Signed)
[facility] Provider Note   CSN: [id number] Arrival date & time: 10/17/18  2346     History   Chief Complaint Chief Complaint  Patient presents with  . Abdominal Pain    HPI Sierra Hensley is a 47 y.o. female.  HPI 47 year old female with a complicated recent surgical history including perforated appendicitis status post delayed appendectomy last week, with postop course complicated by small bowel obstruction that resolved, here with ongoing abdominal pain.  Patient was just discharged following admission for small bowel obstruction 2 days ago.  She initially felt better and had multiple vomitings yesterday.  However, today, she only had one bowel movement this morning and since then has had progressively worsening aching, gnawing, squeezing abdominal pain.  She has associated abdominal distention.  She has had nausea but no vomiting.  She denies any difficulty with eating, however.  No fevers or chills.  She is been taking her pain medications and other medications as prescribed.  No urinary symptoms.  No drainage or redness around her surgical sites.  Pain is worse with palpation or movement.  No alleviating factors.  Past Medical History:  Diagnosis Date  . 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 Small bowel obstruction (HCC) 10/13/2018  . Acute appendicitis with perforation and peritoneal abscess 08/09/2018  . Generalized abdominal cramping 08/03/2018  . Diarrhea of presumed infectious origin 08/03/2018  . History of gestational diabetes 05/15/2018  . Gastroesophageal reflux disease 05/15/2018    Past Surgical History:  Procedure Laterality Date  . CESAREAN SECTION MULTI-GESTATIONAL N/A 12/09/2014   Procedure: CESAREAN SECTION MULTI-GESTATIONAL;  Surgeon: Lavina Hammanodd Meisinger, MD;  Location: [facility];  Service: Obstetrics;  Laterality: N/A;  . LAPAROSCOPIC APPENDECTOMY N/A  10/03/2018   Procedure: APPENDECTOMY LAPAROSCOPIC ERAS PATHWAY;  Surgeon: Sheliah HatchKinsinger, De BlanchLuke Aaron, MD;  Location: [facility] OR;  Service: General;  Laterality: N/A;     OB History    Gravida  1   Para  1   Term      Preterm  1   AB      Living  2     SAB      TAB      Ectopic      Multiple  1   Live Births  2            Home Medications    Prior to Admission medications   Medication Sig Start Date End Date Taking? Authorizing Provider  amoxicillin-clavulanate (AUGMENTIN) 875-125 MG tablet Take 1 tablet by mouth every 12 (twelve) hours. 10/16/18   Kinsinger, De BlanchLuke Aaron, MD  HYDROcodone-acetaminophen (NORCO/VICODIN) 5-325 MG tablet Take 1 tablet by mouth every 6 (six) hours as needed for moderate pain. Patient not taking: Reported on 10/12/2018 10/03/18   Kinsinger, De BlanchLuke Aaron, MD  ibuprofen (ADVIL,MOTRIN) 800 MG tablet Take 1 tablet (800 mg total) by mouth every 8 (eight) hours as needed. Patient not taking: Reported on 10/12/2018 10/03/18   Kinsinger, De BlanchLuke Aaron, MD    Family History No family history on file.  Social History Social History   Tobacco Use  . Smoking status: Never Smoker  . Smokeless tobacco: Never Used  Substance Use Topics  . Alcohol use: No  . Drug use: No     Allergies   Patient has no known allergies.   Review of Systems Review of Systems  Constitutional: Negative for chills, fatigue and fever.  HENT: Negative for  congestion and rhinorrhea.   Eyes: Negative for visual disturbance.  Respiratory: Negative for cough, shortness of breath and wheezing.   Cardiovascular: Negative for chest pain and leg swelling.  Gastrointestinal: Positive for abdominal distention, abdominal pain and nausea. Negative for diarrhea and vomiting.  Genitourinary: Negative for dysuria and flank pain.  Musculoskeletal: Negative for neck pain and neck stiffness.  Skin: Negative for rash and wound.  Allergic/Immunologic: Negative for immunocompromised state.    Neurological: Positive for weakness. Negative for syncope and headaches.  All other systems reviewed and are negative.    Physical Exam Updated Vital Signs BP 112/80   Pulse 97   Temp 98.1 F (36.7 C) (Oral)   Resp 16   Ht 5\' 4"  (1.626 m)   Wt 55.6 kg   LMP 10/07/2018   SpO2 98%   BMI 21.04 kg/m   Physical Exam  Constitutional: She is oriented to person, place, and time. She appears well-developed and well-nourished. No distress.  HENT:  Head: Normocephalic and atraumatic.  Eyes: Conjunctivae are normal.  Neck: Neck supple.  Cardiovascular: Normal rate, regular rhythm and normal heart sounds. Exam reveals no friction rub.  No murmur heard. Pulmonary/Chest: Effort normal and breath sounds normal. No respiratory distress. She has no wheezes. She has no rales.  Abdominal: She exhibits no distension.  Mild distention.  Significant epigastric tenderness with guarding.  Surgical sites appear clean, dry, and intact.  Bowel sounds are hyperactive.  Musculoskeletal: She exhibits no edema.  Neurological: She is alert and oriented to person, place, and time. She exhibits normal muscle tone.  Skin: Skin is warm. Capillary refill takes less than 2 seconds.  Psychiatric: She has a normal mood and affect.  Nursing note and vitals reviewed.    ED Treatments / Results  Labs (all labs ordered are listed, but only abnormal results are displayed) Labs Reviewed  COMPREHENSIVE METABOLIC PANEL - Abnormal; Notable for the following components:      Result Value   Glucose, Bld 122 (*)    Total Protein 8.2 (*)    All other components within normal limits  URINALYSIS, ROUTINE W REFLEX MICROSCOPIC - Abnormal; Notable for the following components:   Color, Urine STRAW (*)    All other components within normal limits  CBC WITH DIFFERENTIAL/PLATELET  LIPASE, BLOOD  I-STAT CG4 LACTIC ACID, ED    EKG None  Radiology Dg Abd 2 Views  Result Date: 10/18/2018 CLINICAL DATA:  Mid abdominal  pain EXAM: ABDOMEN - 2 VIEW COMPARISON:  October 14, 2018 FINDINGS: The lung bases are normal. No free air, portal venous gas, or pneumatosis. There is a dilated loop of small bowel in the abdomen. There is air throughout the colon as well but the small bowel is dilated out of proportion to the amount of colonic air. No other acute abnormalities. IMPRESSION: Small bowel dilated out of proportion to the colon is consistent with a small bowel obstruction. CT imaging could better evaluate. Electronically Signed   By: Gerome Sam III M.D   On: 10/18/2018 00:24    Procedures Procedures (including critical care time)  Medications Ordered in ED Medications  sodium chloride 0.9 % bolus 1,000 mL (1,000 mLs Intravenous New Bag/Given 10/18/18 0022)  morphine 4 MG/ML injection 4 mg (4 mg Intravenous Given 10/18/18 0021)  ondansetron (ZOFRAN) injection 4 mg (4 mg Intravenous Given 10/18/18 0021)     Initial Impression / Assessment and Plan / ED Course  I have reviewed the triage vital signs and the  nursing notes.  Pertinent labs & imaging results that were available during my care of the patient were reviewed by me and considered in my medical decision making (see chart for details).     47 yo F s/p lap appy with Dr. Sheliah Hatch on 11/6, s/p recent admission 11/16-11/19 for SBO here w/ recurrent abd pain, nausea, abdominal distension. Labs are very reassuring. History, XR ABD is consistent with recurrent SBO. Given her reassuring labs and exam, will discuss with Surgery regarding need for repeat CT versus admission with pain control/SBO management.  D/w Dr. Lindie Spruce, who will admit. NPO, IVF. No vomiting ,will hold on NGT at this time.   Final Clinical Impressions(s) / ED Diagnoses   Final diagnoses:  Abdominal pain  SBO (small bowel obstruction) [facility])    ED Discharge Orders    None       Shaune Pollack, MD 10/18/18 [phone]

## 2018-10-17 NOTE — ED Triage Notes (Signed)
Pt c/o midline abdominal pain, started tonight. lbm around 1 today, reports as normal. Recent admit for SBO

## 2018-10-18 ENCOUNTER — Encounter (HOSPITAL_COMMUNITY): Payer: Self-pay | Admitting: *Deleted

## 2018-10-18 ENCOUNTER — Emergency Department (HOSPITAL_COMMUNITY): Payer: BLUE CROSS/BLUE SHIELD

## 2018-10-18 ENCOUNTER — Other Ambulatory Visit: Payer: Self-pay

## 2018-10-18 LAB — CBC WITH DIFFERENTIAL/PLATELET
Abs Immature Granulocytes: 0.02 10*3/uL (ref 0.00–0.07)
BASOS ABS: 0 10*3/uL (ref 0.0–0.1)
BASOS PCT: 0 %
EOS ABS: 0.2 10*3/uL (ref 0.0–0.5)
Eosinophils Relative: 2 %
HEMATOCRIT: 41 % (ref 36.0–46.0)
Hemoglobin: 12.6 g/dL (ref 12.0–15.0)
Immature Granulocytes: 0 %
LYMPHS ABS: 2 10*3/uL (ref 0.7–4.0)
Lymphocytes Relative: 25 %
MCH: 27.2 pg (ref 26.0–34.0)
MCHC: 30.7 g/dL (ref 30.0–36.0)
MCV: 88.4 fL (ref 80.0–100.0)
Monocytes Absolute: 0.4 10*3/uL (ref 0.1–1.0)
Monocytes Relative: 5 %
NRBC: 0 % (ref 0.0–0.2)
Neutro Abs: 5.6 10*3/uL (ref 1.7–7.7)
Neutrophils Relative %: 68 %
Platelets: 367 10*3/uL (ref 150–400)
RBC: 4.64 MIL/uL (ref 3.87–5.11)
RDW: 12.5 % (ref 11.5–15.5)
WBC: 8.2 10*3/uL (ref 4.0–10.5)

## 2018-10-18 LAB — COMPREHENSIVE METABOLIC PANEL
ALBUMIN: 4.2 g/dL (ref 3.5–5.0)
ALT: 17 U/L (ref 0–44)
AST: 20 U/L (ref 15–41)
Alkaline Phosphatase: 55 U/L (ref 38–126)
Anion gap: 9 (ref 5–15)
BILIRUBIN TOTAL: 0.4 mg/dL (ref 0.3–1.2)
BUN: 6 mg/dL (ref 6–20)
CO2: 26 mmol/L (ref 22–32)
Calcium: 9.6 mg/dL (ref 8.9–10.3)
Chloride: 105 mmol/L (ref 98–111)
Creatinine, Ser: 0.77 mg/dL (ref 0.44–1.00)
GFR calc Af Amer: >60 mL/min (ref >60)
GFR calc non Af Amer: >60 mL/min (ref >60)
Glucose, Bld: 122 mg/dL — ABNORMAL HIGH (ref 70–99)
POTASSIUM: 3.7 mmol/L (ref 3.5–5.1)
Sodium: 140 mmol/L (ref 135–145)
TOTAL PROTEIN: 8.2 g/dL — AB (ref 6.5–8.1)

## 2018-10-18 LAB — BASIC METABOLIC PANEL
ANION GAP: 6 (ref 5–15)
BUN: 5 mg/dL — ABNORMAL LOW (ref 6–20)
CO2: 23 mmol/L (ref 22–32)
Calcium: 8.4 mg/dL — ABNORMAL LOW (ref 8.9–10.3)
Chloride: 108 mmol/L (ref 98–111)
Creatinine, Ser: 0.65 mg/dL (ref 0.44–1.00)
GFR calc non Af Amer: >60 mL/min (ref >60)
Glucose, Bld: 105 mg/dL — ABNORMAL HIGH (ref 70–99)
POTASSIUM: 3.9 mmol/L (ref 3.5–5.1)
SODIUM: 137 mmol/L (ref 135–145)

## 2018-10-18 LAB — CBC
HCT: 31.2 % — ABNORMAL LOW (ref 36.0–46.0)
Hemoglobin: 9.7 g/dL — ABNORMAL LOW (ref 12.0–15.0)
MCH: 27.6 pg (ref 26.0–34.0)
MCHC: 31.1 g/dL (ref 30.0–36.0)
MCV: 88.9 fL (ref 80.0–100.0)
Platelets: 275 10*3/uL (ref 150–400)
RBC: 3.51 MIL/uL — AB (ref 3.87–5.11)
RDW: 12.5 % (ref 11.5–15.5)
WBC: 4.8 10*3/uL (ref 4.0–10.5)
nRBC: 0 % (ref 0.0–0.2)

## 2018-10-18 LAB — URINALYSIS, ROUTINE W REFLEX MICROSCOPIC
Bilirubin Urine: NEGATIVE
Glucose, UA: NEGATIVE mg/dL
Hgb urine dipstick: NEGATIVE
KETONES UR: NEGATIVE mg/dL
LEUKOCYTES UA: NEGATIVE
Nitrite: NEGATIVE
PROTEIN: NEGATIVE mg/dL
Specific Gravity, Urine: 1.008 (ref 1.005–1.030)
pH: 7 (ref 5.0–8.0)

## 2018-10-18 LAB — LIPASE, BLOOD: LIPASE: 36 U/L (ref 11–51)

## 2018-10-18 LAB — CG4 I-STAT (LACTIC ACID): LACTIC ACID, VENOUS: 1.16 mmol/L (ref 0.5–1.9)

## 2018-10-18 MED ORDER — ONDANSETRON HCL 4 MG/2ML IJ SOLN
4.0000 mg | Freq: Once | INTRAMUSCULAR | Status: AC
  Administered 2018-10-18: 4 mg via INTRAVENOUS
  Filled 2018-10-18: qty 2

## 2018-10-18 MED ORDER — METOCLOPRAMIDE HCL 5 MG/ML IJ SOLN
10.0000 mg | Freq: Three times a day (TID) | INTRAMUSCULAR | Status: DC
  Administered 2018-10-18 – 2018-10-19 (×3): 10 mg via INTRAVENOUS
  Filled 2018-10-18 (×3): qty 2

## 2018-10-18 MED ORDER — ONDANSETRON HCL 4 MG/2ML IJ SOLN: 4.0000 mg | Freq: Four times a day (QID) | INTRAMUSCULAR | Status: DC | PRN

## 2018-10-18 MED ORDER — ENOXAPARIN SODIUM 40 MG/0.4ML ~~LOC~~ SOLN
40.0000 mg | SUBCUTANEOUS | Status: DC
  Administered 2018-10-18: 40 mg via SUBCUTANEOUS
  Filled 2018-10-18: qty 0.4

## 2018-10-18 MED ORDER — MAGNESIUM CITRATE PO SOLN
1.0000 | Freq: Once | ORAL | Status: AC
  Administered 2018-10-18: 1 via ORAL
  Filled 2018-10-18: qty 296

## 2018-10-18 MED ORDER — LACTATED RINGERS IV SOLN: INTRAVENOUS | Status: DC

## 2018-10-18 MED ORDER — MORPHINE SULFATE (PF) 4 MG/ML IV SOLN
4.0000 mg | Freq: Once | INTRAVENOUS | Status: AC
  Administered 2018-10-18: 4 mg via INTRAVENOUS
  Filled 2018-10-18: qty 1

## 2018-10-18 MED ORDER — SODIUM CHLORIDE 0.9 % IV BOLUS
1000.0000 mL | Freq: Once | INTRAVENOUS | Status: AC
  Administered 2018-10-18: 1000 mL via INTRAVENOUS

## 2018-10-18 MED ORDER — MORPHINE SULFATE (PF) 4 MG/ML IV SOLN: 4.0000 mg | INTRAVENOUS | Status: DC | PRN

## 2018-10-18 MED ORDER — ONDANSETRON HCL 4 MG/2ML IJ SOLN: 4.0000 mg | Freq: Three times a day (TID) | INTRAMUSCULAR | Status: AC | PRN

## 2018-10-18 MED ORDER — MORPHINE SULFATE (PF) 2 MG/ML IV SOLN: 2.0000 mg | INTRAVENOUS | Status: DC | PRN

## 2018-10-18 MED ORDER — KCL IN DEXTROSE-NACL 10-5-0.45 MEQ/L-%-% IV SOLN
INTRAVENOUS | Status: DC
  Administered 2018-10-18 – 2018-10-19 (×3): via INTRAVENOUS
  Filled 2018-10-18 (×4): qty 1000

## 2018-10-18 MED ORDER — HYDROCODONE-ACETAMINOPHEN 5-325 MG PO TABS: 1.0000 | ORAL_TABLET | Freq: Four times a day (QID) | ORAL | Status: DC | PRN

## 2018-10-18 NOTE — ED Notes (Signed)
ED TO INPATIENT HANDOFF REPORT  Name/Age/Gender Sierra Hensley 47 y.o. female  Code Status Code Status History    Date Active Date Inactive Code Status Order ID Comments User Context   10/13/2018 0114 10/16/2018 1314 Full Code [id number]  Kinsinger, Arta Bruce, MD ED   08/09/2018 2230 08/21/2018 1658 Full Code [id number]  Kinsinger, Arta Bruce, MD ED   12/09/2014 1311 12/12/2014 2049 Full Code [id number]  Cheri Fowler, MD Inpatient   11/29/2014 0834 12/09/2014 1311 Full Code [id number]  Cheri Fowler, MD Inpatient      Home/SNF/Other Home  Chief Complaint stomach pain   Level of Care/Admitting Diagnosis ED Disposition    ED Disposition Condition [facility] Area: [place] [[id number]]  Level of Care: Med-Surg [16]  Diagnosis: Small bowel obstruction [facility]) [833383]  Admitting Physician: Judeth Horn [[id number]]  Attending Physician: Judeth Horn [phone]  Estimated length of stay: past midnight tomorrow  Certification:: I certify this patient will need inpatient services for at least 2 midnights  PT Class (Do Not Modify): Inpatient [101]  PT Acc Code (Do Not Modify): Private [1]       Medical History Past Medical History:  Diagnosis Date  . 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    Allergies No Known Allergies  IV Location/Drains/Wounds Patient Lines/Drains/Airways Status   Active Line/Drains/Airways    Name:   Placement date:   Placement time:   Site:   Days:   Peripheral IV 10/18/18 Left Antecubital   10/18/18    0005    Antecubital   less than 1   Incision (Closed) 12/09/14 Abdomen   12/09/14    0735     1409   Incision (Closed) 12/09/14 Vagina Other (Comment)   12/09/14    0736     1409   Incision (Closed) 08/11/18 Abdomen Left;Lower;Anterior   08/11/18    2355     68   Incision (Closed) 08/11/18 Left;Posterior;Lateral   08/11/18    2356     68   Incision (Closed) 10/03/18 Abdomen   10/03/18    1128     15   Incision - 3 Ports  Abdomen 1: Umbilicus 2: Left;Upper 3: Lower;Medial   10/03/18    1138     15          Labs/Imaging Results for orders placed or performed during the hospital encounter of 10/17/18 (from the past 48 hour(s))  CBC with Differential     Status: None   Collection Time: 10/17/18 11:56 PM  Result Value Ref Range   WBC 8.2 4.0 - 10.5 K/uL   RBC 4.64 3.87 - 5.11 MIL/uL   Hemoglobin 12.6 12.0 - 15.0 g/dL   HCT 41.0 36.0 - 46.0 %   MCV 88.4 80.0 - 100.0 fL   MCH 27.2 26.0 - 34.0 pg   MCHC 30.7 30.0 - 36.0 g/dL   RDW 12.5 11.5 - 15.5 %   Platelets 367 150 - 400 K/uL   nRBC 0.0 0.0 - 0.2 %   Neutrophils Relative % 68 %   Neutro Abs 5.6 1.7 - 7.7 K/uL   Lymphocytes Relative 25 %   Lymphs Abs 2.0 0.7 - 4.0 K/uL   Monocytes Relative 5 %   Monocytes Absolute 0.4 0.1 - 1.0 K/uL   Eosinophils Relative 2 %   Eosinophils Absolute 0.2 0.0 - 0.5 K/uL   Basophils Relative 0 %   Basophils Absolute 0.0 0.0 - 0.1 K/uL   Immature  Granulocytes 0 %   Abs Immature Granulocytes 0.02 0.00 - 0.07 K/uL    Comment: Performed at [facility], [place] [address] [place], [place] [address]  Comprehensive metabolic panel     Status: Abnormal   Collection Time: 10/17/18 11:56 PM  Result Value Ref Range   Sodium 140 135 - 145 mmol/L   Potassium 3.7 3.5 - 5.1 mmol/L   Chloride 105 98 - 111 mmol/L   CO2 26 22 - 32 mmol/L   Glucose, Bld 122 (H) 70 - 99 mg/dL   BUN 6 6 - 20 mg/dL   Creatinine, Ser 0.77 0.44 - 1.00 mg/dL   Calcium 9.6 8.9 - 10.3 mg/dL   Total Protein 8.2 (H) 6.5 - 8.1 g/dL   Albumin 4.2 3.5 - 5.0 g/dL   AST 20 15 - 41 U/L   ALT 17 0 - 44 U/L   Alkaline Phosphatase 55 38 - 126 U/L   Total Bilirubin 0.4 0.3 - 1.2 mg/dL   GFR calc non Af Amer >60 >60 mL/min   GFR calc Af Amer >60 >60 mL/min    Comment: (NOTE) The eGFR has been calculated using the CKD EPI equation. This calculation has not been validated in all clinical situations. eGFR's persistently <60 mL/min signify possible Chronic  Kidney Disease.    Anion gap 9 5 - 15    Comment: Performed at [facility] [address] [place], [place] [address]  Lipase, blood     Status: None   Collection Time: 10/17/18 11:56 PM  Result Value Ref Range   Lipase 36 11 - 51 U/L    Comment: Performed at [facility] [address], [place], [place] [address]  Urinalysis, Routine w reflex microscopic     Status: Abnormal   Collection Time: 10/18/18 12:05 AM  Result Value Ref Range   Color, Urine STRAW (A) YELLOW   APPearance CLEAR CLEAR   Specific Gravity, Urine 1.008 1.005 - 1.030   pH 7.0 5.0 - 8.0   Glucose, UA NEGATIVE NEGATIVE mg/dL   Hgb urine dipstick NEGATIVE NEGATIVE   Bilirubin Urine NEGATIVE NEGATIVE   Ketones, ur NEGATIVE NEGATIVE mg/dL   Protein, ur NEGATIVE NEGATIVE mg/dL   Nitrite NEGATIVE NEGATIVE   Leukocytes, UA NEGATIVE NEGATIVE    Comment: Performed at [facility] [address] [place], [place] [address]   Dg Abd 2 Views  Result Date: 10/18/2018 CLINICAL DATA:  Mid abdominal pain EXAM: ABDOMEN - 2 VIEW COMPARISON:  October 14, 2018 FINDINGS: The lung bases are normal. No free air, portal venous gas, or pneumatosis. There is a dilated loop of small bowel in the abdomen. There is air throughout the colon as well but the small bowel is dilated out of proportion to the amount of colonic air. No other acute abnormalities. IMPRESSION: Small bowel dilated out of proportion to the colon is consistent with a small bowel obstruction. CT imaging could better evaluate. Electronically Signed   By: Dorise Bullion III M.D   On: 10/18/2018 00:24   None  Pending Labs Unresulted Labs (From admission, onward)   None      Vitals/Pain Today's Vitals   10/17/18 2354 10/18/18 0030 10/18/18 0100 10/18/18 0130  BP: 112/80 118/77 122/79 116/72  Pulse: 97 81 70 71  Resp: 16 16    Temp: 98.1 F (36.7 C)     TempSrc: Oral     SpO2: 98% 99% 100% 100%  Weight: 55.6  kg     Height: 5' 4"  (1.626 m)      PainSc: 6        Isolation Precautions No active isolations  Medications Medications  lactated ringers infusion (has no administration in time range)  ondansetron (ZOFRAN) injection 4 mg (has no administration in time range)  morphine 4 MG/ML injection 4 mg (has no administration in time range)  morphine 4 MG/ML injection 4 mg (4 mg Intravenous Given 10/18/18 0021)  ondansetron (ZOFRAN) injection 4 mg (4 mg Intravenous Given 10/18/18 0021)  sodium chloride 0.9 % bolus 1,000 mL (0 mLs Intravenous Stopped 10/18/18 0123)    Mobility walks

## 2018-10-18 NOTE — ED Notes (Signed)
Floor reports they still do not have a bed for the room and will call back when one is available.

## 2018-10-18 NOTE — Progress Notes (Signed)
Rec'd Report from ED RN.  Waiting on bed for room; otherwise, we are ready for patient.  Will call ED once bed for room is here.  Bernie CoveyKimberly Joely Losier RN

## 2018-10-18 NOTE — ED Notes (Signed)
Patient transported to X-ray 

## 2018-10-18 NOTE — H&P (Signed)
Sierra Hensley is an 47 y.o. female.   Chief Complaint: Abdominal pain and bloating HPI: discharged two days ago with the same complaint of nausea, bloating and abdominal pain.  Discharged after rief admission for partial SBO.  She is being seen again this admisiion for the same concern  Past Medical History:  Diagnosis Date  . 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 CESAREAN SECTION MULTI-GESTATIONAL N/A 12/09/2014   Procedure: CESAREAN SECTION MULTI-GESTATIONAL;  Surgeon: Cheri Fowler, MD;  Location: [facility];  Service: Obstetrics;  Laterality: N/A;  . LAPAROSCOPIC APPENDECTOMY N/A 10/03/2018   Procedure: [place];  Surgeon: Kieth Brightly Arta Bruce, MD;  Location: [facility];  Service: General;  Laterality: N/A;    History reviewed. No pertinent family history. Social History:  reports that she has never smoked. She has never used smokeless tobacco. She reports that she does not drink alcohol or use drugs.  Allergies: No Known Allergies  Medications Prior to Admission  Medication Sig Dispense Refill  . amoxicillin-clavulanate (AUGMENTIN) 875-125 MG tablet Take 1 tablet by mouth every 12 (twelve) hours. 20 tablet 0  . HYDROcodone-acetaminophen (NORCO/VICODIN) 5-325 MG tablet Take 1 tablet by mouth every 6 (six) hours as needed for moderate pain. (Patient not taking: Reported on 10/12/2018) 20 tablet 0  . ibuprofen (ADVIL,MOTRIN) 800 MG tablet Take 1 tablet (800 mg total) by mouth every 8 (eight) hours as needed. (Patient not taking: Reported on 10/12/2018) 30 tablet 0    Results for orders placed or performed during the hospital encounter of 10/17/18 (from the past 48 hour(s))  CBC with Differential     Status: None   Collection Time: 10/17/18 11:56 PM  Result Value Ref Range   WBC 8.2 4.0 - 10.5 K/uL   RBC 4.64 3.87 - 5.11 MIL/uL   Hemoglobin 12.6 12.0 - 15.0 g/dL   HCT 41.0 36.0 - 46.0 %   MCV  88.4 80.0 - 100.0 fL   MCH 27.2 26.0 - 34.0 pg   MCHC 30.7 30.0 - 36.0 g/dL   RDW 12.5 11.5 - 15.5 %   Platelets 367 150 - 400 K/uL   nRBC 0.0 0.0 - 0.2 %   Neutrophils Relative % 68 %   Neutro Abs 5.6 1.7 - 7.7 K/uL   Lymphocytes Relative 25 %   Lymphs Abs 2.0 0.7 - 4.0 K/uL   Monocytes Relative 5 %   Monocytes Absolute 0.4 0.1 - 1.0 K/uL   Eosinophils Relative 2 %   Eosinophils Absolute 0.2 0.0 - 0.5 K/uL   Basophils Relative 0 %   Basophils Absolute 0.0 0.0 - 0.1 K/uL   Immature Granulocytes 0 %   Abs Immature Granulocytes 0.02 0.00 - 0.07 K/uL    Comment: Performed at [facility], [address], [place], [place] [address]  Comprehensive metabolic panel     Status: Abnormal   Collection Time: 10/17/18 11:56 PM  Result Value Ref Range   Sodium 140 135 - 145 mmol/L   Potassium 3.7 3.5 - 5.1 mmol/L   Chloride 105 98 - 111 mmol/L   CO2 26 22 - 32 mmol/L   Glucose, Bld 122 (H) 70 - 99 mg/dL   BUN 6 6 - 20 mg/dL   Creatinine, Ser 0.77 0.44 - 1.00 mg/dL   Calcium 9.6 8.9 - 10.3 mg/dL   Total Protein 8.2 (H) 6.5 - 8.1 g/dL   Albumin 4.2 3.5 - 5.0 g/dL  AST 20 15 - 41 U/L   ALT 17 0 - 44 U/L   Alkaline Phosphatase 55 38 - 126 U/L   Total Bilirubin 0.4 0.3 - 1.2 mg/dL   GFR calc non Af Amer >60 >60 mL/min   GFR calc Af Amer >60 >60 mL/min    Comment: (NOTE) The eGFR has been calculated using the CKD EPI equation. This calculation has not been validated in all clinical situations. eGFR's persistently <60 mL/min signify possible Chronic Kidney Disease.    Anion gap 9 5 - 15    Comment: Performed at [facility] [address], [place], [place] [address]  Lipase, blood     Status: None   Collection Time: 10/17/18 11:56 PM  Result Value Ref Range   Lipase 36 11 - 51 U/L    Comment: Performed at [facility] [address], [place], [place] [address]  Urinalysis, Routine w reflex microscopic     Status: Abnormal   Collection Time: 10/18/18 12:05 AM   Result Value Ref Range   Color, Urine STRAW (A) YELLOW   APPearance CLEAR CLEAR   Specific Gravity, Urine 1.008 1.005 - 1.030   pH 7.0 5.0 - 8.0   Glucose, UA NEGATIVE NEGATIVE mg/dL   Hgb urine dipstick NEGATIVE NEGATIVE   Bilirubin Urine NEGATIVE NEGATIVE   Ketones, ur NEGATIVE NEGATIVE mg/dL   Protein, ur NEGATIVE NEGATIVE mg/dL   Nitrite NEGATIVE NEGATIVE   Leukocytes, UA NEGATIVE NEGATIVE    Comment: Performed at [facility] [address], [place], [place] [address]   Dg Abd 2 Views  Result Date: 10/18/2018 CLINICAL DATA:  Mid abdominal pain EXAM: ABDOMEN - 2 VIEW COMPARISON:  October 14, 2018 FINDINGS: The lung bases are normal. No free air, portal venous gas, or pneumatosis. There is a dilated loop of small bowel in the abdomen. There is air throughout the colon as well but the small bowel is dilated out of proportion to the amount of colonic air. No other acute abnormalities. IMPRESSION: Small bowel dilated out of proportion to the colon is consistent with a small bowel obstruction. CT imaging could better evaluate. Electronically Signed   By: Dorise Bullion III M.D   On: 10/18/2018 00:24    Review of Systems  Gastrointestinal: Positive for abdominal pain, nausea and vomiting.  All other systems reviewed and are negative.   Blood pressure 111/66, pulse 73, temperature 98.3 F (36.8 C), temperature source Oral, resp. rate 18, height _0  (1.626 m), weight 55.6 kg, last menstrual period 10/07/2018, SpO2 100 %. Physical Exam  Vitals reviewed. Constitutional: She is oriented to person, place, and time. She appears well-developed and well-nourished.  HENT:  Head: Normocephalic and atraumatic.  Eyes: Pupils are equal, round, and reactive to light. Conjunctivae and EOM are normal.  Neck: Normal range of motion. Neck supple.  Cardiovascular: Normal rate, regular rhythm, normal heart sounds and intact distal pulses.  Respiratory: Effort normal and breath sounds normal.   GI: Soft. Normal appearance and bowel sounds are normal. She exhibits distension (mild distension). There is no tenderness. There is no rigidity, no rebound and no guarding.  Neurological: She is alert and oriented to person, place, and time. She has normal reflexes.  Skin: Skin is warm and dry.  Psychiatric: She has a normal mood and affect. Her behavior is normal. Judgment and thought content normal.     Assessment/Plan Partial SBO  Plain films are nothing as they were described by  the ED provider.  She only has one central loop of small  Bowel that is dilated, and with  All the air in the colon, she is not sgnificantly obstructed.  Admitted for pain control and IV hydration  Judeth Horn, MD 10/18/2018, 4:35 AM

## 2018-10-18 NOTE — Progress Notes (Signed)
  Progress Note: General Surgery Service   Assessment/Plan: Active Problems:   Small bowel obstruction (HCC) pSBO, had bowel movement yesterday, no nausea or vomiting today only pain. -mag citrate today -continue liquids -restart reglan   LOS: 0 days  Chief Complaint/Subjective: Pain yesterday, twisting pain in periumbilical area and lower abdomen. Currently not having pain, no nausea or vomiting.  Objective: Vital signs in last 24 hours: Temp:  [98.1 F (36.7 C)-98.3 F (36.8 C)] 98.1 F (36.7 C) (11/21 0925) Pulse Rate:  [70-97] 72 (11/21 0925) Resp:  [16-18] 18 (11/21 0925) BP: (104-122)/(66-80) 104/67 (11/21 0925) SpO2:  [98 %-100 %] 100 % (11/21 0925) Weight:  [55.6 kg-57.1 kg] 57.1 kg (11/21 0316) Last BM Date: 10/17/18  Intake/Output from previous day: 11/20 0701 - 11/21 0700 In: 1041.7 [I.V.:41.7; IV Piggyback:1000] Out: 200 [Urine:200] Intake/Output this shift: Total I/O In: 600 [P.O.:600] Out: 0   Gen: NAD  Lungs: nonlabored breathing  Cardiovascular: RRR  Abd: soft, pain on palpation lower abdomen, no guarding  Extremities: no edema  Neuro: AOx4  Studies/Results:  BMP Latest Ref Rng & Units 10/18/2018 10/17/2018 10/16/2018  Glucose 70 - 99 mg/dL 308(M105(H) 578(I122(H) 94  BUN 6 - 20 mg/dL 5(L) 6 5(L)  Creatinine 0.44 - 1.00 mg/dL 6.960.65 2.950.77 2.840.81  BUN/Creat Ratio 9 - 23 - - -  Sodium 135 - 145 mmol/L 137 140 137  Potassium 3.5 - 5.1 mmol/L 3.9 3.7 3.9  Chloride 98 - 111 mmol/L 108 105 103  CO2 22 - 32 mmol/L 23 26 26   Calcium 8.9 - 10.3 mg/dL 1.3(K8.4(L) 9.6 9.0      Rodman PickleLuke Aaron Kinsinger, MD [facility]

## 2018-10-18 NOTE — ED Notes (Signed)
Report was called at 0155, floor reports no bed in room and they will call when bed in room is available.

## 2018-10-18 NOTE — ED Notes (Signed)
Floor called, okay to transport in 5 minutes

## 2018-10-19 MED ORDER — FLORANEX PO PACK
1.0000 g | PACK | Freq: Three times a day (TID) | ORAL | Status: DC
  Filled 2018-10-19 (×2): qty 1

## 2018-10-19 MED ORDER — METOCLOPRAMIDE HCL 10 MG PO TABS: 10.0000 mg | ORAL_TABLET | Freq: Three times a day (TID) | ORAL | 0 refills | Status: DC

## 2018-10-19 MED ORDER — METOCLOPRAMIDE HCL 10 MG PO TABS
10.0000 mg | ORAL_TABLET | Freq: Three times a day (TID) | ORAL | Status: DC
  Administered 2018-10-19: 10 mg via ORAL
  Filled 2018-10-19: qty 1

## 2018-10-19 NOTE — Progress Notes (Signed)
  Progress Note: General Surgery Service   Assessment/Plan: Active Problems:   Small bowel obstruction (HCC) Pain resolved -advance diet -oral reglan for 1 month -lactobacillus today for long abx course -no abx needed at discharge   LOS: 1 day  Chief Complaint/Subjective: No pain, no nausea, drank 1/2 mag citrate, no BM overnight  Objective: Vital signs in last 24 hours: Temp:  [97.8 F (36.6 C)-98.8 F (37.1 C)] 97.8 F (36.6 C) (11/22 0511) Pulse Rate:  [65-72] 65 (11/22 0511) Resp:  [18] 18 (11/22 0511) BP: (96-112)/(60-71) 96/60 (11/22 0511) SpO2:  [99 %-100 %] 99 % (11/22 0511) Last BM Date: 10/18/18  Intake/Output from previous day: 11/21 0701 - 11/22 0700 In: 2705.6 [P.O.:840; I.V.:1865.6] Out: 0  Intake/Output this shift: No intake/output data recorded.  Lungs: nonlabored breathing  Cardiovascular: RRR  Abd: soft, NT, ND, incisions c/d/i  Extremities: no edema  Neuro: AOx4  Lab Results: CBC  Recent Labs    10/17/18 2356 10/18/18 0921  WBC 8.2 4.8  HGB 12.6 9.7*  HCT 41.0 31.2*  PLT 367 275   BMET Recent Labs    10/17/18 2356 10/18/18 0921  NA 140 137  K 3.7 3.9  CL 105 108  CO2 26 23  GLUCOSE 122* 105*  BUN 6 5*  CREATININE 0.77 0.65  CALCIUM 9.6 8.4*   PT/INR No results for input(s): LABPROT, INR in the last 72 hours. ABG No results for input(s): PHART, HCO3 in the last 72 hours.  Invalid input(s): PCO2, PO2  Studies/Results:  Anti-infectives: Anti-infectives (From admission, onward)   None      Medications: Scheduled Meds: . enoxaparin (LOVENOX) injection  40 mg Subcutaneous Q24H  . lactobacillus  1 g Oral TID WC  . metoCLOPramide  10 mg Oral TID AC   Continuous Infusions: . dextrose 5 % and 0.45 % NaCl with KCl 10 mEq/L 75 mL/hr at 10/19/18 0656   PRN Meds:.HYDROcodone-acetaminophen, morphine injection, ondansetron (ZOFRAN) IV  Sierra PickleLuke Aaron Lamoyne Hessel, MD Pg# [phone] [facility]

## 2018-10-19 NOTE — Discharge Summary (Signed)
Physician Discharge Summary  Patient ID: Sierra Hensley MRN: [id number] DOB/AGE: 46/05/1971 46 y.o.  Admit date: 10/17/2018 Discharge date: 10/19/2018  Admission Diagnoses:  Discharge Diagnoses:  Active Problems:   Small bowel obstruction [facility](HCC)   Discharged Condition: good  Hospital Course: 47 yo female presented with abdominal pain a few weeks after interval lap appendectomy. Her pain resolved. She never had nausea or vomiting. She tolerated a diet and was discharged home HD 3.  Consults: None  Significant Diagnostic Studies:  BMP Latest Ref Rng & Units 10/18/2018 10/17/2018 10/16/2018  Glucose 70 - 99 mg/dL 086(V105(H) 784(O122(H) 94  BUN 6 - 20 mg/dL 5(L) 6 5(L)  Creatinine 0.44 - 1.00 mg/dL 9.620.65 9.520.77 8.410.81  BUN/Creat Ratio 9 - 23 - - -  Sodium 135 - 145 mmol/L 137 140 137  Potassium 3.5 - 5.1 mmol/L 3.9 3.7 3.9  Chloride 98 - 111 mmol/L 108 105 103  CO2 22 - 32 mmol/L 23 26 26   Calcium 8.9 - 10.3 mg/dL 3.2(G8.4(L) 9.6 9.0     Treatments: IV hydration  Discharge Exam: Blood pressure 112/74, pulse 71, temperature 97.8 F (36.6 C), temperature source Oral, resp. rate 18, height 5\' 4"  (1.626 m), weight 57.1 kg, last menstrual period 10/07/2018, SpO2 100 %. General appearance: alert and cooperative Neck: no adenopathy, no carotid bruit, no JVD, supple, symmetrical, trachea midline and thyroid not enlarged, symmetric, no tenderness/mass/nodules Back: symmetric, no curvature. ROM normal. No CVA tenderness. Resp: clear to auscultation bilaterally Cardio: regular rate and rhythm, S1, S2 normal, no murmur, click, rub or gallop  Abd: soft, NT, ND  Disposition: Discharge disposition: 01-Home or Self Care        Allergies as of 10/19/2018   No Known Allergies     Medication List    STOP taking these medications   amoxicillin-clavulanate 875-125 MG tablet Commonly known as:  AUGMENTIN     TAKE these medications   HYDROcodone-acetaminophen 5-325 MG tablet Commonly known as:   NORCO/VICODIN Take 1 tablet by mouth every 6 (six) hours as needed for moderate pain.   ibuprofen 800 MG tablet Commonly known as:  ADVIL,MOTRIN Take 1 tablet (800 mg total) by mouth every 8 (eight) hours as needed.   metoCLOPramide 10 MG tablet Commonly known as:  REGLAN Take 1 tablet (10 mg total) by mouth 3 (three) times daily before meals.      Follow-up Information    Kinsinger, De BlanchLuke Aaron, MD Follow up in 3 week(s).   Specialty:  General Surgery Contact information: [address] [place] [address] [phone]           Signed: De BlanchLuke Aaron Kinsinger 10/19/2018, 3:14 PM

## 2018-10-19 NOTE — Progress Notes (Signed)
Patient discharged to home with husband. After visit Summary reviewed. Patient capable of reverbalizing medications and follow up visits. No signs and symptoms of distress noted. Patient educated to return to the ED in the case of an emergency. Sierra Walkup RN 

## 2019-02-21 IMAGING — CT CT ABD-PELV W/ CM
2 of 5 series · 16 of 46 positions shown, 18 images · IV contrast (iopamidol)
Comparison: 08/27/2018

CLINICAL DATA: Follow-up of perforated appendicitis.

EXAM:
CT ABDOMEN AND PELVIS WITH CONTRAST
TECHNIQUE: Multidetector CT imaging of the abdomen and pelvis was performed
using the standard protocol following bolus administration of
intravenous contrast.
CONTRAST:  100mL U4USOB-X22 IOPAMIDOL (U4USOB-X22) INJECTION 61%

[Series 2: abd pelvis 5.00 br40 s3 ax · axial · 0.67mm/px · z∈[+1356,+1736]mm · 13 of 86 slices shown, 15 images]
[im 5/86  soft-tissue]
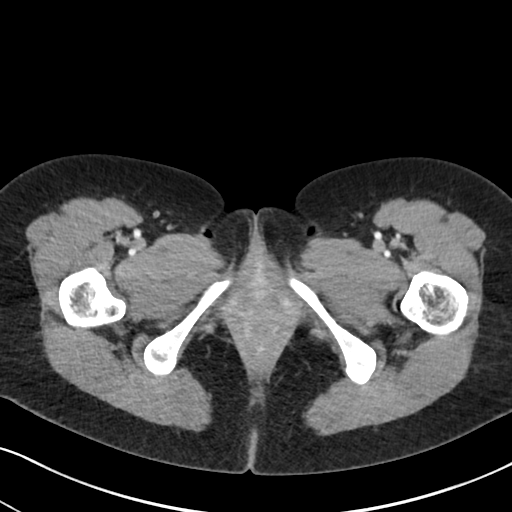
[im 5/86  bone]
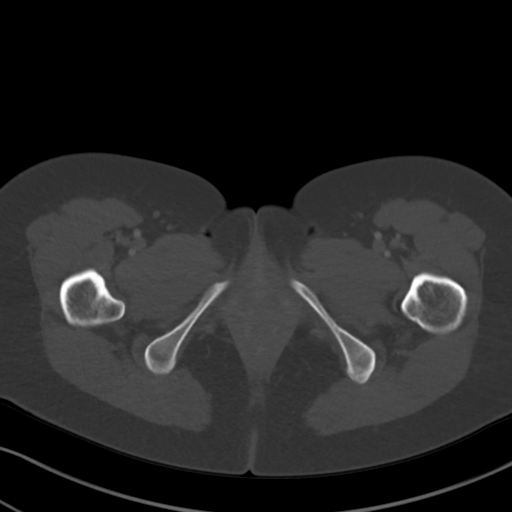
[im 10/86  soft-tissue]
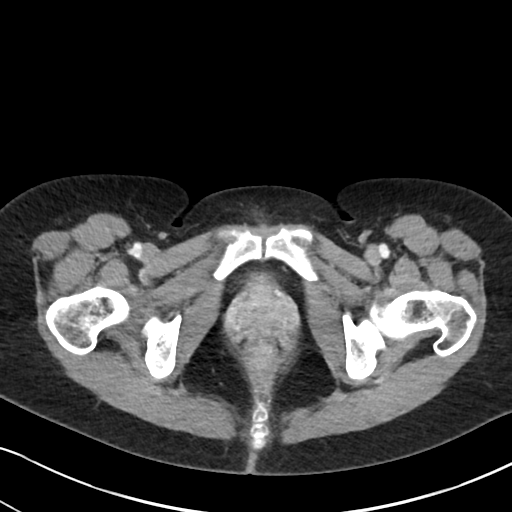
[im 19/86  soft-tissue]
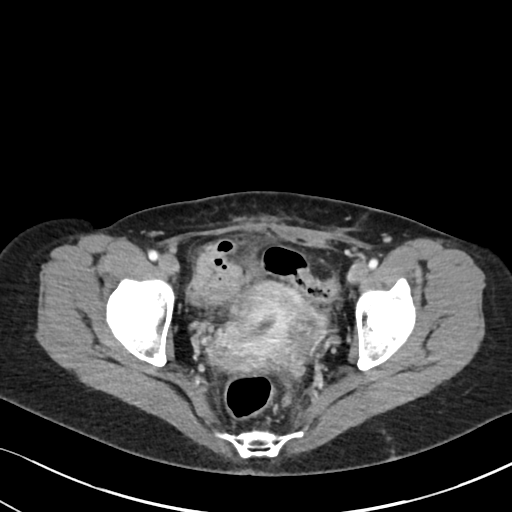
[im 24/86  soft-tissue]
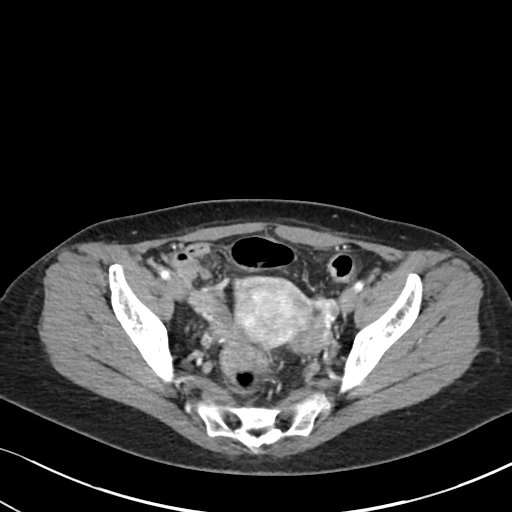
[im 29/86  soft-tissue]
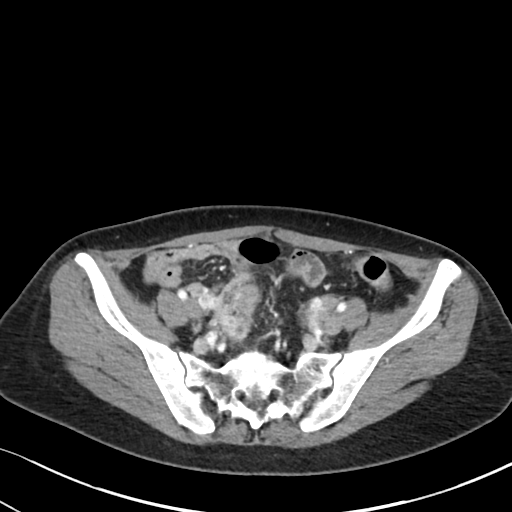
[im 38/86  soft-tissue]
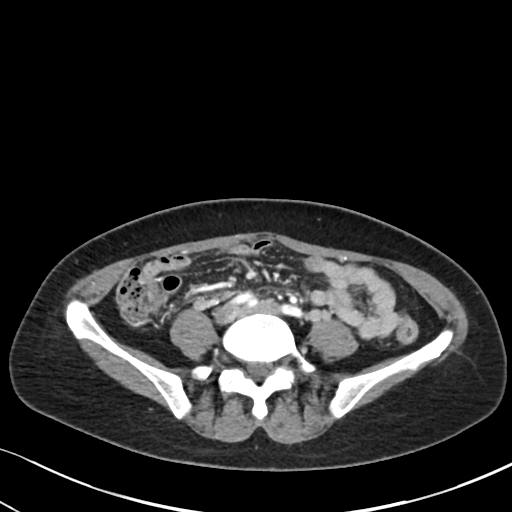
[im 43/86  soft-tissue]
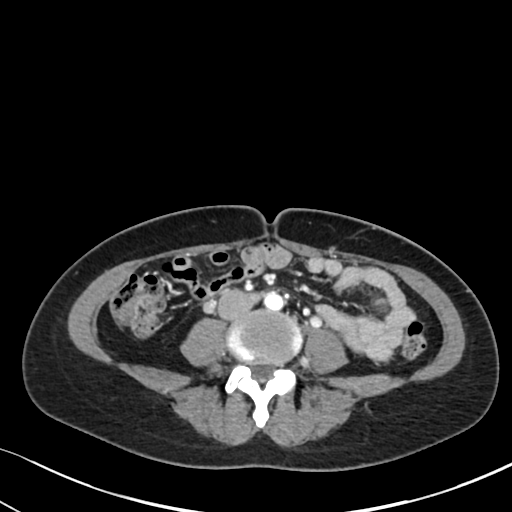
[im 48/86  soft-tissue]
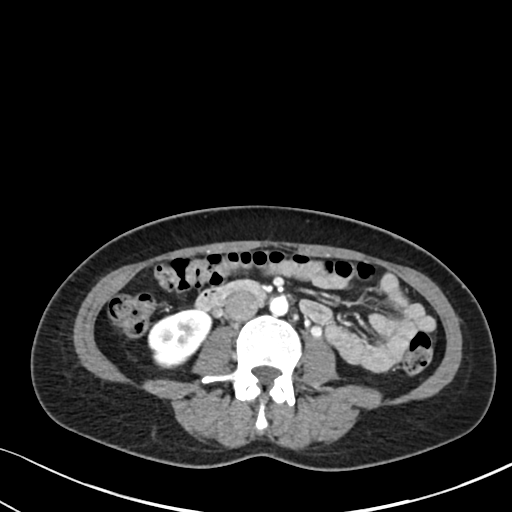
[im 57/86  soft-tissue]
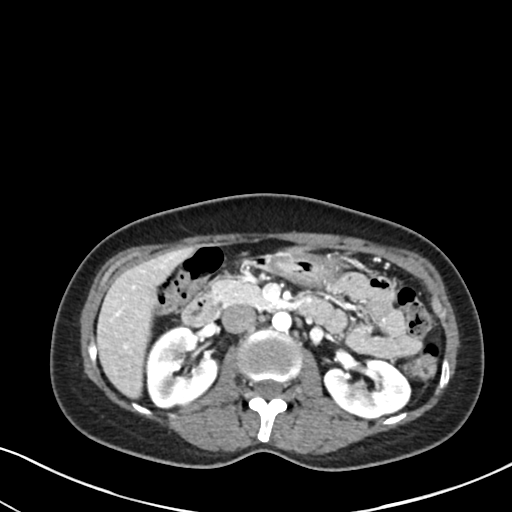
[im 57/86  bone]
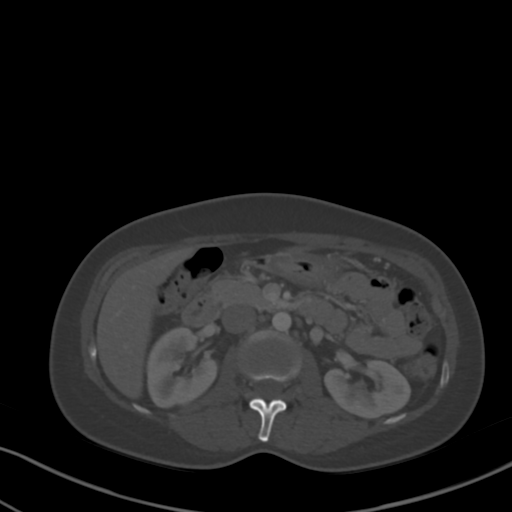
[im 62/86  soft-tissue]
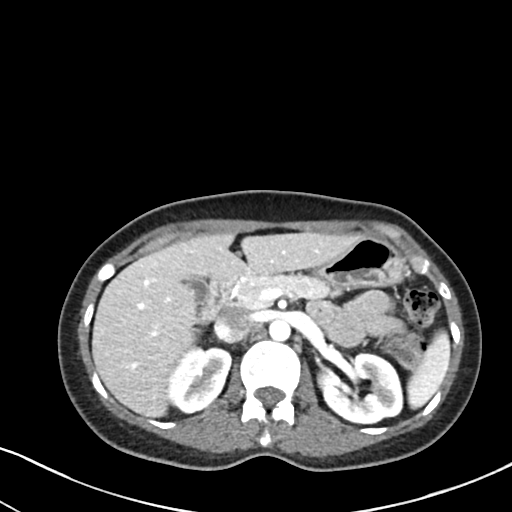
[im 67/86  soft-tissue]
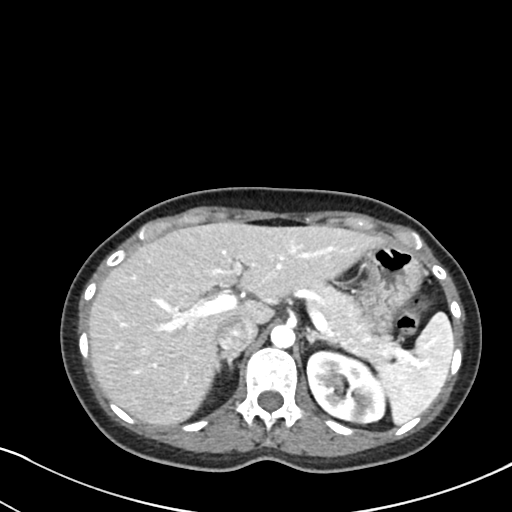
[im 76/86  soft-tissue]
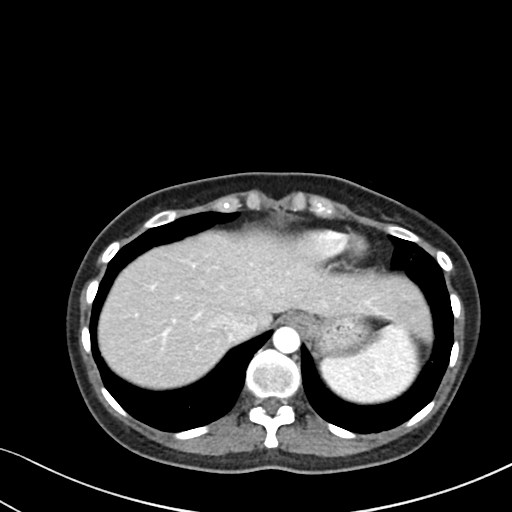
[im 81/86  soft-tissue]
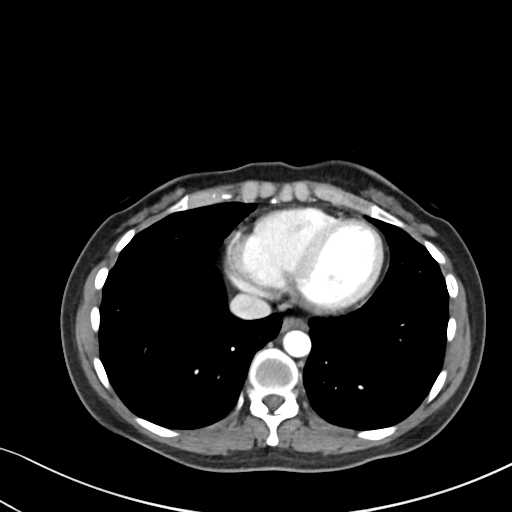

[Series 6: abd pelvis 2.00 br40 s3 cor · coronal · 0.67mm/px · 3 of 126 slices shown]
[im 42/126  soft-tissue]
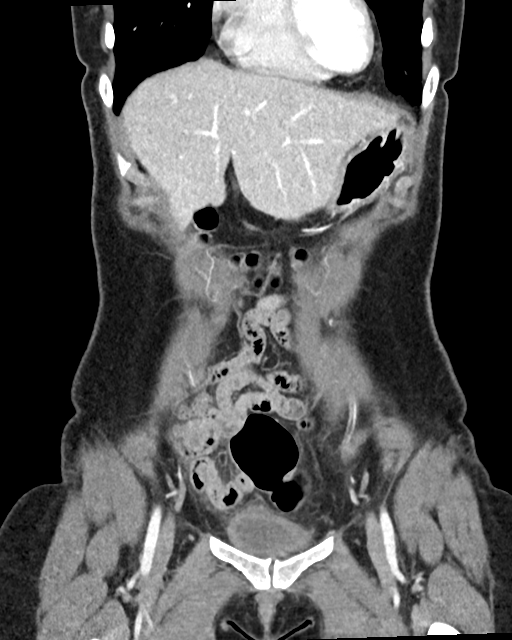
[im 56/126  soft-tissue]
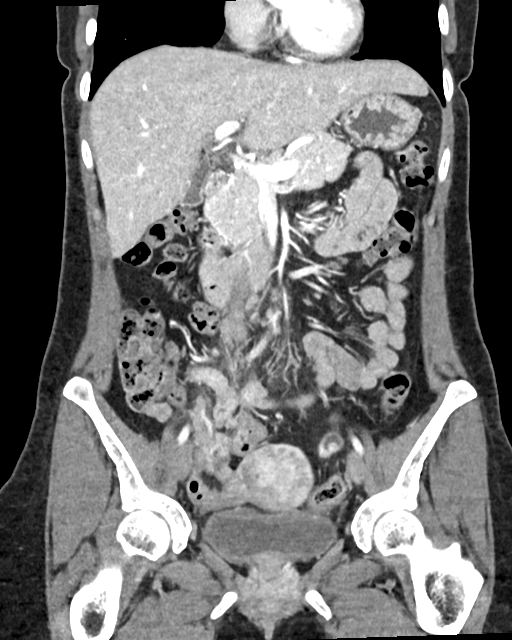
[im 70/126  soft-tissue]
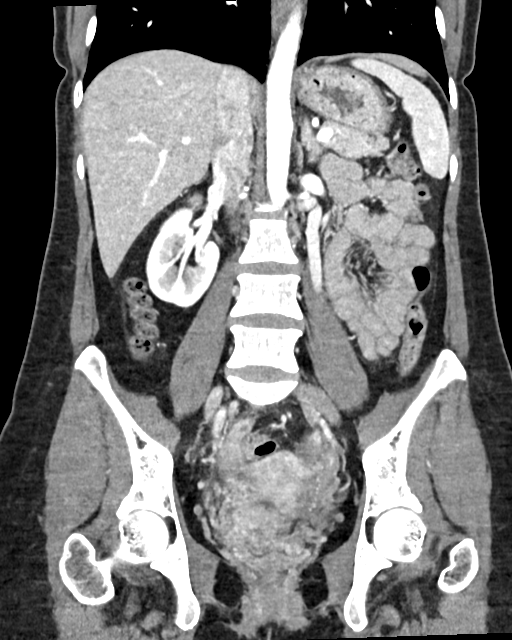

[16 of 46 positions shown; findings below may reference images not displayed]

FINDINGS: Lower chest: Subsegmental atelectasis at the left lung base. Normal
heart size without pericardial or pleural effusion.

Hepatobiliary: Normal liver. Normal gallbladder, without biliary
ductal dilatation.

Pancreas: Normal, without mass or ductal dilatation.

Spleen: Normal in size, without focal abnormality.

Adrenals/Urinary Tract: Normal adrenal glands. Upper pole left renal
1.7 cm cyst or minimally complex cyst. Normal right kidney, without
hydronephrosis or hydroureter. Normal urinary bladder.

Stomach/Bowel: Normal stomach, without wall thickening. Normal colon
and terminal ileum.

The appendix is identified and normal proximally. The distal
appendix is enlarged, including at 12 mm on image 53/2, 13 mm on
coronal image 56. At the site of presacral gas on the prior exam is
a 1.3 cm soft tissue density on image 53/2. This is in contact with
the adjacent small bowel, including on image 55/2. No well-defined
fluid collection and no extraluminal gas. Suspect mild residual
edema adjacent the distal appendix and contiguous or adjacent soft
tissue density.

Normal caliber of small bowel loops.

Vascular/Lymphatic: Normal caliber of the aorta and branch vessels.
No abdominopelvic adenopathy.

Reproductive: Normal uterus and adnexa.

Other: No significant free fluid.

Musculoskeletal: No acute osseous abnormality.
IMPRESSION: 1. Persistently enlarged distal appendix with adjacent soft tissue
density, possibly related to scar tissue or phlegmon. Suspicion of
mild residual periappendiceal edema. Cannot exclude chronic
appendicitis. No well-defined fluid collection or extraluminal gas
to suggest drainable abscess.
2. Small bowel which is positioned immediately adjacent to the above
described soft tissue density and could be adherent. No well-defined
fistulous communication.

## 2020-12-05 ENCOUNTER — Ambulatory Visit: Payer: BLUE CROSS/BLUE SHIELD | Attending: Internal Medicine

## 2020-12-05 DIAGNOSIS — Z23 Encounter for immunization: Secondary | ICD-10-CM

## 2020-12-05 NOTE — Progress Notes (Signed)
   Covid-19 Vaccination Clinic  Name:  Sierra Hensley    MRN: [id number] DOB: March 09, 1971  12/05/2020  [name] was observed post Covid-19 immunization for 15 minutes without incident. She was provided with Vaccine Information Sheet and instruction to access the V-Safe system.   [name] was instructed to call 911 with any severe reactions post vaccine: Marland Kitchen Difficulty breathing  . Swelling of face and throat  . A fast heartbeat  . A bad rash all over body  . Dizziness and weakness   Immunizations Administered    Name Date Dose VIS Date Route   Moderna Covid-19 Booster Vaccine 12/05/2020  9:49 AM 0.25 mL 09/16/2020 Intramuscular   Manufacturer: Moderna   Lot: [id number]   NDC: 63785-885-02

## 2020-12-27 ENCOUNTER — Encounter (HOSPITAL_COMMUNITY): Payer: Self-pay

## 2020-12-27 ENCOUNTER — Emergency Department (HOSPITAL_COMMUNITY)
Admission: EM | Admit: 2020-12-27 | Discharge: 2020-12-27 | Disposition: A | Discharge status: Home / Self Care | Payer: 59 | Attending: Emergency Medicine | Admitting: Emergency Medicine

## 2020-12-27 ENCOUNTER — Emergency Department (HOSPITAL_COMMUNITY): Payer: 59

## 2020-12-27 ENCOUNTER — Other Ambulatory Visit: Payer: Self-pay

## 2020-12-27 DIAGNOSIS — R519 Headache, unspecified: Secondary | ICD-10-CM | POA: Insufficient documentation

## 2020-12-27 DIAGNOSIS — Y92410 Unspecified street and highway as the place of occurrence of the external cause: Secondary | ICD-10-CM | POA: Diagnosis not present

## 2020-12-27 DIAGNOSIS — M542 Cervicalgia: Secondary | ICD-10-CM | POA: Diagnosis not present

## 2020-12-27 NOTE — Discharge Instructions (Signed)
These use ibuprofen, 400 mg, 3 times daily for the next 4 days for pain control.  Return here for concerning changes in your condition.
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
## 2021-02-14 ENCOUNTER — Other Ambulatory Visit: Payer: Self-pay

## 2021-02-14 ENCOUNTER — Encounter (HOSPITAL_COMMUNITY): Payer: Self-pay | Admitting: Emergency Medicine

## 2021-02-14 ENCOUNTER — Emergency Department (HOSPITAL_COMMUNITY)
Admission: EM | Admit: 2021-02-14 | Discharge: 2021-02-14 | Disposition: A | Discharge status: Home / Self Care | Payer: 59 | Attending: Emergency Medicine | Admitting: Emergency Medicine

## 2021-02-14 DIAGNOSIS — S199XXA Unspecified injury of neck, initial encounter: Secondary | ICD-10-CM | POA: Diagnosis present

## 2021-02-14 DIAGNOSIS — S161XXA Strain of muscle, fascia and tendon at neck level, initial encounter: Secondary | ICD-10-CM | POA: Diagnosis not present

## 2021-02-14 MED ORDER — MELOXICAM 15 MG PO TABS: 15.0000 mg | ORAL_TABLET | Freq: Every day | ORAL | 0 refills | Status: DC

## 2021-02-14 MED ORDER — CYCLOBENZAPRINE HCL 10 MG PO TABS: 10.0000 mg | ORAL_TABLET | Freq: Every day | ORAL | 0 refills | Status: DC
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
## 2021-02-14 NOTE — Discharge Instructions (Signed)
Please read and follow all provided instructions.  Your diagnoses today include:  1. Strain of neck muscle, initial encounter    Tests performed today include:  Vital signs. See below for your results today.   Medications prescribed:   Flexeril (cyclobenzaprine) - muscle relaxer medication  DO NOT drive or perform any activities that require you to be awake and alert because this medicine can make you drowsy.    Meloxicam - anti-inflammatory pain medication  You have been prescribed an anti-inflammatory medication or NSAID. Take with food. Do not take aspirin, ibuprofen, or naproxen if taking this medication. Take smallest effective dose for the shortest duration needed for your pain. Stop taking if you experience stomach pain or vomiting.   Take any prescribed medications only as directed.  Home care instructions:  Follow any educational materials contained in this packet.  BE VERY CAREFUL not to take multiple medicines containing Tylenol (also called acetaminophen). Doing so can lead to an overdose which can damage your liver and cause liver failure and possibly death.   Follow-up instructions: Please follow-up with the orthopedist listed for further evaluation of your symptoms.   Return instructions:   Please return to the Emergency Department if you experience worsening symptoms.   Please return if you have any other emergent concerns.  Additional Information:  Your vital signs today were: BP 114/81 (BP Location: Right Arm)   Pulse 88   Temp 98.5 F (36.9 C)   Resp 18   LMP 01/31/2021   SpO2 98%  If your blood pressure (BP) was elevated above 135/85 this visit, please have this repeated by your doctor within one month. --------------

## 2021-04-29 LAB — RESULTS CONSOLE HPV: CHL HPV: NEGATIVE

## 2021-04-29 LAB — HM PAP SMEAR: HM Pap smear: NORMAL

## 2022-01-18 ENCOUNTER — Ambulatory Visit
Admission: EM | Admit: 2022-01-18 | Discharge: 2022-01-18 | Disposition: A | Discharge status: Home / Self Care | Payer: 59 | Attending: Emergency Medicine | Admitting: Emergency Medicine

## 2022-01-18 ENCOUNTER — Other Ambulatory Visit: Payer: Self-pay

## 2022-01-18 DIAGNOSIS — J988 Other specified respiratory disorders: Secondary | ICD-10-CM | POA: Diagnosis not present

## 2022-01-18 DIAGNOSIS — B9789 Other viral agents as the cause of diseases classified elsewhere: Secondary | ICD-10-CM | POA: Diagnosis not present

## 2022-01-18 MED ORDER — GUAIFENESIN 400 MG PO TABS: ORAL_TABLET | ORAL | 0 refills | Status: DC

## 2022-01-18 MED ORDER — IPRATROPIUM BROMIDE 0.06 % NA SOLN: 2.0000 | Freq: Four times a day (QID) | NASAL | 0 refills | Status: DC

## 2022-01-18 MED ORDER — PROMETHAZINE-DM 6.25-15 MG/5ML PO SYRP: 5.0000 mL | ORAL_SOLUTION | Freq: Four times a day (QID) | ORAL | 0 refills | Status: DC | PRN

## 2022-01-18 NOTE — ED Provider Notes (Signed)
[facility]    CSN: [id number] Arrival date & time: 01/18/22  1116    HISTORY  No chief complaint on file.  HPI Sierra Hensley is a 51 y.o. female. Pt c/o cough, chills, body aches, and congestion for 2 days, states cough is not productive and is worse at night.  Patient has a slightly elevated temperature on arrival but vital signs are otherwise normal, patient is well-appearing.  Patient states she took a blue powder for cough and congestion today which helped, states she does not recall the name of it.  Of note, patient did not cough once during her visit today.  Patient denies nausea, vomiting, diarrhea, body aches, chills, headache, loss of taste or smell.  Patient politely declines viral testing today.  Patient states she is she would like a prescription for better medication for cough.  Patient has previously been prescribed Zyrtec and Flonase in the past, patient states she is not taking either 1 today.  The history is provided by the patient.  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   Patient Active Problem List   Diagnosis Date Noted   Small bowel obstruction (HCC) 10/13/2018   Acute appendicitis with perforation and peritoneal abscess 08/09/2018   Generalized abdominal cramping 08/03/2018   Diarrhea of presumed infectious origin 08/03/2018   History of gestational diabetes 05/15/2018   Gastroesophageal reflux disease 05/15/2018   Past Surgical History:  Procedure Laterality Date   CESAREAN SECTION MULTI-GESTATIONAL N/A 12/09/2014   Procedure: CESAREAN SECTION MULTI-GESTATIONAL;  Surgeon: Lavina Hammanodd Meisinger, MD;  Location: [facility];  Service: Obstetrics;  Laterality: N/A;   LAPAROSCOPIC APPENDECTOMY N/A 10/03/2018   Procedure: APPENDECTOMY LAPAROSCOPIC ERAS PATHWAY;  Surgeon: Sheliah HatchKinsinger, De BlanchLuke Aaron, MD;  Location: [facility];  Service: General;  Laterality: N/A;   OB History     Gravida  1   Para  1   Term  0   Preterm   1   AB  0   Living  2      SAB  0   IAB  0   Ectopic  0   Multiple  1   Live Births  2          Home Medications    Prior to Admission medications   Medication Sig Start Date End Date Taking? Authorizing Provider  cetirizine (ZYRTEC) 10 MG tablet Take 10 mg by mouth daily as needed for allergies. 08/12/19   [provider]  cyclobenzaprine (FLEXERIL) 10 MG tablet Take 1 tablet (10 mg total) by mouth at bedtime. Take as needed for neck soreness. 02/14/21   Renne CriglerGeiple, Joshua, PA-C  fluticasone (FLONASE) 50 MCG/ACT nasal spray Place 1 spray into both nostrils daily as needed for allergies. 08/12/19   [provider]  meloxicam (MOBIC) 15 MG tablet Take 1 tablet (15 mg total) by mouth daily. 02/14/21   Renne CriglerGeiple, Joshua, PA-C  Omega-3 Fatty Acids (FISH OIL) 1000 MG CAPS Take 1,000 mg by mouth daily.    [provider]  pantoprazole (PROTONIX) 40 MG tablet Take 40 mg by mouth daily.    [provider]  RESTASIS 0.05 % ophthalmic emulsion Place 1 drop into both eyes 2 (two) times daily. 09/30/20   [provider]  vitamin E 1000 UNIT capsule Take 1,000 Units by mouth daily.    [provider]  metoCLOPramide (REGLAN) 10 MG tablet Take 1 tablet (10 mg total) by mouth 3 (three) times daily before meals.  Patient not taking: No sig reported 10/19/18 02/14/21  Kinsinger, De Blanch, MD   Family History History reviewed. No pertinent family history. Social History Social History   Tobacco Use   Smoking status: Never   Smokeless tobacco: Never  Vaping Use   Vaping Use: Never used  Substance Use Topics   Alcohol use: No   Drug use: No   Allergies   Patient has no known allergies.  Review of Systems Review of Systems Pertinent findings noted in history of present illness.   Physical Exam Triage Vital Signs ED Triage Vitals  Enc Vitals Group     BP 09/24/21 0827 (!) 147/82     Pulse Rate 09/24/21 0827 72     Resp 09/24/21 0827  18     Temp 09/24/21 0827 98.3 F (36.8 C)     Temp Source 09/24/21 0827 Oral     SpO2 09/24/21 0827 98 %     Weight --      Height --      Head Circumference --      Peak Flow --      Pain Score 09/24/21 0826 5     Pain Loc --      Pain Edu? --      Excl. in GC? --   No data found.  Updated Vital Signs BP 103/69 (BP Location: Right Arm)    Pulse 85    Temp 99.3 F (37.4 C) (Oral)    Resp 18    SpO2 97%   Physical Exam Vitals and nursing note reviewed.  Constitutional:      General: She is not in acute distress.    Appearance: Normal appearance. She is not ill-appearing.  HENT:     Head: Normocephalic and atraumatic.     Salivary Glands: Right salivary gland is not diffusely enlarged or tender. Left salivary gland is not diffusely enlarged or tender.     Right Ear: Ear canal and external ear normal. No drainage. A middle ear effusion is present. There is no impacted cerumen. Tympanic membrane is bulging. Tympanic membrane is not injected or erythematous.     Left Ear: Ear canal and external ear normal. No drainage. A middle ear effusion is present. There is no impacted cerumen. Tympanic membrane is bulging. Tympanic membrane is not injected or erythematous.     Ears:     Comments: Bilateral EACs normal, both TMs bulging with clear fluid    Nose: Rhinorrhea present. No nasal deformity, septal deviation, signs of injury, nasal tenderness, mucosal edema or congestion. Rhinorrhea is clear.     Right Nostril: Occlusion present. No foreign body, epistaxis or septal hematoma.     Left Nostril: Occlusion present. No foreign body, epistaxis or septal hematoma.     Right Turbinates: Enlarged, swollen and pale.     Left Turbinates: Enlarged, swollen and pale.     Right Sinus: No maxillary sinus tenderness or frontal sinus tenderness.     Left Sinus: No maxillary sinus tenderness or frontal sinus tenderness.     Mouth/Throat:     Lips: Pink. No lesions.     Mouth: Mucous membranes are  moist. No oral lesions.     Pharynx: Oropharynx is clear. Uvula midline. No posterior oropharyngeal erythema or uvula swelling.     Tonsils: No tonsillar exudate. 0 on the right. 0 on the left.     Comments: Postnasal drip Eyes:     General: Lids are normal.  Right eye: No discharge.        Left eye: No discharge.     Extraocular Movements: Extraocular movements intact.     Conjunctiva/sclera: Conjunctivae normal.     Right eye: Right conjunctiva is not injected.     Left eye: Left conjunctiva is not injected.  Neck:     Trachea: Trachea and phonation normal.  Cardiovascular:     Rate and Rhythm: Normal rate and regular rhythm.     Pulses: Normal pulses.     Heart sounds: Normal heart sounds. No murmur heard.   No friction rub. No gallop.  Pulmonary:     Effort: Pulmonary effort is normal. No accessory muscle usage, prolonged expiration or respiratory distress.     Breath sounds: Normal breath sounds. No stridor, decreased air movement or transmitted upper airway sounds. No decreased breath sounds, wheezing, rhonchi or rales.  Chest:     Chest wall: No tenderness.  Musculoskeletal:        General: Normal range of motion.     Cervical back: Normal range of motion and neck supple. Normal range of motion.  Lymphadenopathy:     Cervical: No cervical adenopathy.  Skin:    General: Skin is warm and dry.     Findings: No erythema or rash.  Neurological:     General: No focal deficit present.     Mental Status: She is alert and oriented to person, place, and time.  Psychiatric:        Mood and Affect: Mood normal.        Behavior: Behavior normal.    Visual Acuity Right Eye Distance:   Left Eye Distance:   Bilateral Distance:    Right Eye Near:   Left Eye Near:    Bilateral Near:     [facility] Couse / Diagnostics / Procedures:    EKG  Radiology No results found.  Procedures Procedures (including critical care time)  UC Diagnoses / Final Clinical Impressions(s)   I  have reviewed the triage vital signs and the nursing notes.  Pertinent labs & imaging results that were available during my care of the patient were reviewed by me and considered in my medical decision making (see chart for details).   Final diagnoses:  Viral respiratory infection   Common cold.  Patient provided with nasal decongestant, expectorant and cough suppressant for nighttime.  Conservative care recommended.  Return precautions advised. ED Prescriptions     Medication Sig Dispense Auth. Provider   ipratropium (ATROVENT) 0.06 % nasal spray Place 2 sprays into both nostrils 4 (four) times daily. As needed for nasal congestion, runny nose 15 mL Theadora Rama Scales, PA-C   guaifenesin (HUMIBID E) 400 MG TABS tablet Take 1 tablet 3 times daily as needed for chest congestion and cough 21 tablet Theadora Rama Scales, PA-C   promethazine-dextromethorphan (PROMETHAZINE-DM) 6.25-15 MG/5ML syrup Take 5 mLs by mouth 4 (four) times daily as needed for cough. 180 mL Theadora Rama Scales, PA-C      PDMP not reviewed this encounter.  Pending results:  Labs Reviewed - No data to display  Medications Ordered in UC: Medications - No data to display  Disposition Upon Discharge:  Condition: stable for discharge home Home: take medications as prescribed; routine discharge instructions as discussed; follow up as advised.  Patient presented with an acute illness with associated systemic symptoms and significant discomfort requiring urgent management. In my opinion, this is a condition that a prudent lay person (someone who possesses an  average knowledge of health and medicine) may potentially expect to result in complications if not addressed urgently such as respiratory distress, impairment of bodily function or dysfunction of bodily organs.   Routine symptom specific, illness specific and/or disease specific instructions were discussed with the patient and/or caregiver at length.   As such,  the patient has been evaluated and assessed, work-up was performed and treatment was provided in alignment with urgent care protocols and evidence based medicine.  Patient/parent/caregiver has been advised that the patient may require follow up for further testing and treatment if the symptoms continue in spite of treatment, as clinically indicated and appropriate.  If the patient was tested for COVID-19, Influenza and/or RSV, then the patient/parent/guardian was advised to isolate at home pending the results of his/her diagnostic coronavirus test and potentially longer if theyre positive. I have also advised pt that if his/her COVID-19 test returns positive, it's recommended to self-isolate for at least 10 days after symptoms first appeared AND until fever-free for 24 hours without fever reducer AND other symptoms have improved or resolved. Discussed self-isolation recommendations as well as instructions for household member/close contacts as per the [facility] and [place] [facility], and also gave patient the COVID packet with this information.  Patient/parent/caregiver has been advised [facility] in 3-5 days if no better; to PCP or the Emergency Department if new signs and symptoms develop, or if the current signs or symptoms continue to change or worsen for further workup, evaluation and treatment as clinically indicated and appropriate  The patient will follow up with their current PCP if and as advised. If the patient does not currently have a PCP we will assist them in obtaining one.   The patient may need specialty follow up if the symptoms continue, in spite of conservative treatment and management, for further workup, evaluation, consultation and treatment as clinically indicated and appropriate.  Patient/parent/caregiver verbalized understanding and agreement of plan as discussed.  All questions were addressed during visit.  Please see discharge instructions below for further details of  plan.  Discharge Instructions:   Discharge Instructions      Your symptoms and physical exam findings are concerning for a viral respiratory infection.  Based on my physical exam findings, I believe you are suffering from the common cold.   Please see the list below for recommended medications, dosages and frequencies to provide relief of your current symptoms:     Ipratropium (Atrovent): This is an excellent nasal decongestant spray that does not cause rebound congestion, can be used up to 4 times daily as needed, instill 2 sprays into each nare with each use.  I have provided you with a prescription for this medication.      Guaifenesin (Robitussin, Mucinex): This is an expectorant.  This helps break up chest congestion and loosen up thick nasal drainage making phlegm and drainage more liquid and therefore easier to remove.  I recommend being 400 mg three times daily as needed.      Promethazine DM: Promethazine is both a nasal decongestant and an antinausea medication that makes most patients feel fairly sleepy.  The DM is dextromethorphan, a cough suppressant found in many over-the-counter cough medications.  Please take 5 mL before bedtime to minimize your cough which will help you sleep better.  I have provided you with a prescription for this medication.      Conservative care is also recommended at this time.  This includes rest, pushing clear fluids and  activity as tolerated.  Warm beverages such as teas and broths versus cold beverages/popsicles and frozen sherbet/sorbet are your choice, both warm and cold are beneficial.  You may also notice that your appetite is reduced; this is okay as long as you are drinking plenty of clear fluids.    Please follow-up within the next 3 to 5 days either with your primary care provider or urgent care if your symptoms do not resolve.  If you do not have a primary care provider, we will assist you in finding one.     This office note has been  dictated using Teaching laboratory technician.  Unfortunately, and despite my best efforts, this method of dictation can sometimes lead to occasional typographical or grammatical errors.  I apologize in advance if this occurs.     Theadora Rama Scales, [place] 01/18/22 1202

## 2022-01-18 NOTE — ED Triage Notes (Signed)
Pt c/o cough, chills, body aches, and congestion for 2 days.

## 2022-01-18 NOTE — Discharge Instructions (Addendum)
Your symptoms and physical exam findings are concerning for a viral respiratory infection.  Based on my physical exam findings, I believe you are suffering from the common cold.   Please see the list below for recommended medications, dosages and frequencies to provide relief of your current symptoms:     Ipratropium (Atrovent): This is an excellent nasal decongestant spray that does not cause rebound congestion, can be used up to 4 times daily as needed, instill 2 sprays into each nare with each use.  I have provided you with a prescription for this medication.      Guaifenesin (Robitussin, Mucinex): This is an expectorant.  This helps break up chest congestion and loosen up thick nasal drainage making phlegm and drainage more liquid and therefore easier to remove.  I recommend being 400 mg three times daily as needed.      Promethazine DM: Promethazine is both a nasal decongestant and an antinausea medication that makes most patients feel fairly sleepy.  The DM is dextromethorphan, a cough suppressant found in many over-the-counter cough medications.  Please take 5 mL before bedtime to minimize your cough which will help you sleep better.  I have provided you with a prescription for this medication.      Conservative care is also recommended at this time.  This includes rest, pushing clear fluids and activity as tolerated.  Warm beverages such as teas and broths versus cold beverages/popsicles and frozen sherbet/sorbet are your choice, both warm and cold are beneficial.  You may also notice that your appetite is reduced; this is okay as long as you are drinking plenty of clear fluids.    Please follow-up within the next 3 to 5 days either with your primary care provider or urgent care if your symptoms do not resolve.  If you do not have a primary care provider, we will assist you in finding one.

## 2022-05-18 ENCOUNTER — Ambulatory Visit
Admission: EM | Admit: 2022-05-18 | Discharge: 2022-05-18 | Disposition: A | Discharge status: Home / Self Care | Payer: 59 | Attending: Emergency Medicine | Admitting: Emergency Medicine

## 2022-05-18 DIAGNOSIS — R6883 Chills (without fever): Secondary | ICD-10-CM | POA: Diagnosis not present

## 2022-05-18 DIAGNOSIS — B349 Viral infection, unspecified: Secondary | ICD-10-CM

## 2022-05-18 DIAGNOSIS — R52 Pain, unspecified: Secondary | ICD-10-CM | POA: Diagnosis not present

## 2022-05-18 MED ORDER — IBUPROFEN 400 MG PO TABS: 400.0000 mg | ORAL_TABLET | Freq: Three times a day (TID) | ORAL | 0 refills | Status: DC | PRN

## 2022-05-18 NOTE — ED Provider Notes (Signed)
[facility]    CSN: [id number] Arrival date & time: 05/18/22  1812    HISTORY   Chief Complaint  Patient presents with   Fever   HPI Sierra Hensley is a 51 y.o. female. Pt c/o body ache, headache, subjective fever and cold chills for 3 days, patient has normal vital signs on arrival, patient is well-appearing.  Patient states she took an over-the-counter medication, the name of which she does not recall, states it only provided minimal relief.  Patient denies nausea, vomiting, diarrhea, sore throat, cough, nasal congestion, rhinorrhea, loss of taste or smell.  Patient requests viral testing today.    The history is provided by the patient.   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   Patient Active Problem List   Diagnosis Date Noted   Small bowel obstruction (HCC) 10/13/2018   Acute appendicitis with perforation and peritoneal abscess 08/09/2018   Generalized abdominal cramping 08/03/2018   Diarrhea of presumed infectious origin 08/03/2018   History of gestational diabetes 05/15/2018   Gastroesophageal reflux disease 05/15/2018   Past Surgical History:  Procedure Laterality Date   CESAREAN SECTION MULTI-GESTATIONAL N/A 12/09/2014   Procedure: CESAREAN SECTION MULTI-GESTATIONAL;  Surgeon: Lavina Hamman, MD;  Location: [facility];  Service: Obstetrics;  Laterality: N/A;   LAPAROSCOPIC APPENDECTOMY N/A 10/03/2018   Procedure: APPENDECTOMY LAPAROSCOPIC ERAS PATHWAY;  Surgeon: Sheliah Hatch De Blanch, MD;  Location: [facility];  Service: General;  Laterality: N/A;   OB History     Gravida  1   Para  1   Term  0   Preterm  1   AB  0   Living  2      SAB  0   IAB  0   Ectopic  0   Multiple  1   Live Births  2          Home Medications    Prior to Admission medications   Medication Sig Start Date End Date Taking? Authorizing Provider  cetirizine (ZYRTEC) 10 MG tablet Take 10 mg by mouth daily as needed for  allergies. 08/12/19   [provider]  cyclobenzaprine (FLEXERIL) 10 MG tablet Take 1 tablet (10 mg total) by mouth at bedtime. Take as needed for neck soreness. 02/14/21   Renne Crigler, PA-C  fluticasone (FLONASE) 50 MCG/ACT nasal spray Place 1 spray into both nostrils daily as needed for allergies. 08/12/19   [provider]  guaifenesin (HUMIBID E) 400 MG TABS tablet Take 1 tablet 3 times daily as needed for chest congestion and cough 01/18/22   Theadora Rama Scales, PA-C  ipratropium (ATROVENT) 0.06 % nasal spray Place 2 sprays into both nostrils 4 (four) times daily. As needed for nasal congestion, runny nose 01/18/22   Theadora Rama Scales, PA-C  meloxicam (MOBIC) 15 MG tablet Take 1 tablet (15 mg total) by mouth daily. 02/14/21   Renne Crigler, PA-C  Omega-3 Fatty Acids (FISH OIL) 1000 MG CAPS Take 1,000 mg by mouth daily.    [provider]  pantoprazole (PROTONIX) 40 MG tablet Take 40 mg by mouth daily.    [provider]  promethazine-dextromethorphan (PROMETHAZINE-DM) 6.25-15 MG/5ML syrup Take 5 mLs by mouth 4 (four) times daily as needed for cough. 01/18/22   Theadora Rama Scales, PA-C  RESTASIS 0.05 % ophthalmic emulsion Place 1 drop into both eyes 2 (two) times daily. 09/30/20   [provider]  vitamin E 1000 UNIT capsule Take 1,000 Units  by mouth daily.    [provider]  metoCLOPramide (REGLAN) 10 MG tablet Take 1 tablet (10 mg total) by mouth 3 (three) times daily before meals. Patient not taking: No sig reported 10/19/18 02/14/21  Kinsinger, De Blanch, MD   Family History Family History  Family history unknown: Yes   Social History Social History   Tobacco Use   Smoking status: Never   Smokeless tobacco: Never  Vaping Use   Vaping Use: Never used  Substance Use Topics   Alcohol use: No   Drug use: No   Allergies   Patient has no known allergies.  Review of Systems Review of Systems Pertinent findings noted  in history of present illness.   Physical Exam Triage Vital Signs ED Triage Vitals  Enc Vitals Group     BP 09/24/21 0827 (!) 147/82     Pulse Rate 09/24/21 0827 72     Resp 09/24/21 0827 18     Temp 09/24/21 0827 98.3 F (36.8 C)     Temp Source 09/24/21 0827 Oral     SpO2 09/24/21 0827 98 %     Weight --      Height --      Head Circumference --      Peak Flow --      Pain Score 09/24/21 0826 5     Pain Loc --      Pain Edu? --      Excl. in GC? --   No data found.  Updated Vital Signs BP 109/73   Pulse 86   Temp 98.7 F (37.1 C)   Resp 19   SpO2 98%   Physical Exam Vitals and nursing note reviewed.  Constitutional:      General: She is not in acute distress.    Appearance: Normal appearance. She is not ill-appearing.  HENT:     Head: Normocephalic and atraumatic.     Salivary Glands: Right salivary gland is not diffusely enlarged or tender. Left salivary gland is not diffusely enlarged or tender.     Right Ear: Tympanic membrane, ear canal and external ear normal. No drainage. No middle ear effusion. There is no impacted cerumen. Tympanic membrane is not erythematous or bulging.     Left Ear: Tympanic membrane, ear canal and external ear normal. No drainage.  No middle ear effusion. There is no impacted cerumen. Tympanic membrane is not erythematous or bulging.     Nose: Nose normal. No nasal deformity, septal deviation, mucosal edema, congestion or rhinorrhea.     Right Turbinates: Not enlarged, swollen or pale.     Left Turbinates: Not enlarged, swollen or pale.     Right Sinus: No maxillary sinus tenderness or frontal sinus tenderness.     Left Sinus: No maxillary sinus tenderness or frontal sinus tenderness.     Mouth/Throat:     Lips: Pink. No lesions.     Mouth: Mucous membranes are moist. No oral lesions.     Pharynx: Oropharynx is clear. Uvula midline. No posterior oropharyngeal erythema or uvula swelling.     Tonsils: No tonsillar exudate. 0 on the right.  0 on the left.  Eyes:     General: Lids are normal.        Right eye: No discharge.        Left eye: No discharge.     Extraocular Movements: Extraocular movements intact.     Conjunctiva/sclera: Conjunctivae normal.     Right eye: Right conjunctiva is not  injected.     Left eye: Left conjunctiva is not injected.  Neck:     Trachea: Trachea and phonation normal.  Cardiovascular:     Rate and Rhythm: Normal rate and regular rhythm.     Pulses: Normal pulses.     Heart sounds: Normal heart sounds. No murmur heard.    No friction rub. No gallop.  Pulmonary:     Effort: Pulmonary effort is normal. No accessory muscle usage, prolonged expiration or respiratory distress.     Breath sounds: Normal breath sounds. No stridor, decreased air movement or transmitted upper airway sounds. No decreased breath sounds, wheezing, rhonchi or rales.  Chest:     Chest wall: No tenderness.  Musculoskeletal:        General: Normal range of motion.     Cervical back: Normal range of motion and neck supple. Normal range of motion.  Lymphadenopathy:     Cervical: No cervical adenopathy.  Skin:    General: Skin is warm and dry.     Findings: No erythema or rash.  Neurological:     General: No focal deficit present.     Mental Status: She is alert and oriented to person, place, and time.  Psychiatric:        Mood and Affect: Mood normal.        Behavior: Behavior normal.     Visual Acuity Right Eye Distance:   Left Eye Distance:   Bilateral Distance:    Right Eye Near:   Left Eye Near:    Bilateral Near:     [facility] Couse / Diagnostics / Procedures:    EKG  Radiology No results found.  Procedures Procedures (including critical care time)  UC Diagnoses / Final Clinical Impressions(s)   I have reviewed the triage vital signs and the nursing notes.  Pertinent labs & imaging results that were available during my care of the patient were reviewed by me and considered in my medical decision  making (see chart for details).   Final diagnoses:  Generalized body aches  Chills  Viral illness   Patient is well-appearing on exam today.  Patient requesting medication.  Patient provided with ibuprofen 400 mg.  Patient was agreeable to viral testing, will notify patient of results once received.  If COVID-19 test is positive and the result is back before 623, patient would benefit from Paxlovid.  Patient would no longer benefit from Tamiflu if influenza test is positive.  ED Prescriptions     Medication Sig Dispense Auth. Provider   ibuprofen (ADVIL) 400 MG tablet Take 1 tablet (400 mg total) by mouth every 8 (eight) hours as needed for up to 30 doses. 30 tablet Theadora Rama Scales, PA-C      PDMP not reviewed this encounter.  Pending results:  Labs Reviewed  COVID-19, FLU A+B NAA    Medications Ordered in UC: Medications - No data to display  Disposition Upon Discharge:  Condition: stable for discharge home Home: take medications as prescribed; routine discharge instructions as discussed; follow up as advised.  Patient presented with an acute illness with associated systemic symptoms and significant discomfort requiring urgent management. In my opinion, this is a condition that a prudent lay person (someone who possesses an average knowledge of health and medicine) may potentially expect to result in complications if not addressed urgently such as respiratory distress, impairment of bodily function or dysfunction of bodily organs.   Routine symptom specific, illness specific and/or disease specific instructions were discussed with  the patient and/or caregiver at length.   As such, the patient has been evaluated and assessed, work-up was performed and treatment was provided in alignment with urgent care protocols and evidence based medicine.  Patient/parent/caregiver has been advised that the patient may require follow up for further testing and treatment if the symptoms  continue in spite of treatment, as clinically indicated and appropriate.  If the patient was tested for COVID-19, Influenza and/or RSV, then the patient/parent/guardian was advised to isolate at home pending the results of his/her diagnostic coronavirus test and potentially longer if they're positive. I have also advised pt that if his/her COVID-19 test returns positive, it's recommended to self-isolate for at least 10 days after symptoms first appeared AND until fever-free for 24 hours without fever reducer AND other symptoms have improved or resolved. Discussed self-isolation recommendations as well as instructions for household member/close contacts as per the [facility] and [place] DHHS, and also gave patient the COVID packet with this information.  Patient/parent/caregiver has been advised to return to the [facility] or PCP in 3-5 days if no better; to PCP or the Emergency Department if new signs and symptoms develop, or if the current signs or symptoms continue to change or worsen for further workup, evaluation and treatment as clinically indicated and appropriate  The patient will follow up with their current PCP if and as advised. If the patient does not currently have a PCP we will assist them in obtaining one.   The patient may need specialty follow up if the symptoms continue, in spite of conservative treatment and management, for further workup, evaluation, consultation and treatment as clinically indicated and appropriate.  Patient/parent/caregiver verbalized understanding and agreement of plan as discussed.  All questions were addressed during visit.  Please see discharge instructions below for further details of plan.  Discharge Instructions:   Discharge Instructions      Your symptoms and physical exam findings are concerning for a viral respiratory infection.     You were tested for both COVID-19 and influenza today.  The results of your viral testing will be posted to your MyChart once it is  complete, typically this takes 36 to 48 hours.    If your COVID-19 test is positive, please advise the nurse whether you are interested in taking the recommended antiviral for COVID-19 called Paxlovid.   Based on my physical exam findings and the history you have provided  today, I do not recommend antibiotics at this time.  I do not believe the risks and side effects of antibiotics would outweigh any minimal benefit that they might provide.       Please see the list below for recommended medications, dosages and frequencies to provide relief of your current symptoms:     Advil, Motrin (ibuprofen): This is a good anti-inflammatory medication which addresses aches, pains and inflammation of the upper airways that causes sinus and nasal congestion as well as in the lower airways which makes your cough feel tight and sometimes burn.  I recommend that you take 400 mg every 6-8 hours as needed for aches and chills.      Please follow-up within the next 5-7 days either with your primary care provider or urgent care if your symptoms do not resolve.  If you do not have a primary care provider, we will assist you in finding one.   Thank you for visiting urgent care today.  We appreciate the opportunity to participate in your care.  This office note has been dictated using Teaching laboratory technicianDragon speech recognition software.  Unfortunately, and despite my best efforts, this method of dictation can sometimes lead to occasional typographical or grammatical errors.  I apologize in advance if this occurs.     Theadora RamaMorgan, Izear Pine Scales, PA-C 05/18/22 1857

## 2022-05-18 NOTE — Discharge Instructions (Signed)
Your symptoms and physical exam findings are concerning for a viral respiratory infection.     You were tested for both COVID-19 and influenza today.  The results of your viral testing will be posted to your MyChart once it is complete, typically this takes 36 to 48 hours.    If your COVID-19 test is positive, please advise the nurse whether you are interested in taking the recommended antiviral for COVID-19 called Paxlovid.   Based on my physical exam findings and the history you have provided  today, I do not recommend antibiotics at this time.  I do not believe the risks and side effects of antibiotics would outweigh any minimal benefit that they might provide.       Please see the list below for recommended medications, dosages and frequencies to provide relief of your current symptoms:     Advil, Motrin (ibuprofen): This is a good anti-inflammatory medication which addresses aches, pains and inflammation of the upper airways that causes sinus and nasal congestion as well as in the lower airways which makes your cough feel tight and sometimes burn.  I recommend that you take 400 mg every 6-8 hours as needed for aches and chills.      Please follow-up within the next 5-7 days either with your primary care provider or urgent care if your symptoms do not resolve.  If you do not have a primary care provider, we will assist you in finding one.   Thank you for visiting urgent care today.  We appreciate the opportunity to participate in your care.

## 2022-05-18 NOTE — ED Triage Notes (Signed)
Pt presents with bodyaches, headache, feeling feverish, and cold chills x 3 days. Reports minimal relief with otc medications.

## 2022-05-20 LAB — COVID-19, FLU A+B NAA
Influenza A, NAA: NOT DETECTED
Influenza B, NAA: NOT DETECTED

## 2022-05-26 ENCOUNTER — Ambulatory Visit (INDEPENDENT_AMBULATORY_CARE_PROVIDER_SITE_OTHER): Payer: 59 | Admitting: Nurse Practitioner

## 2022-05-26 ENCOUNTER — Encounter: Payer: Self-pay | Admitting: Nurse Practitioner

## 2022-05-26 VITALS — BP 104/70 | HR 78 | Temp 97.6°F | Ht 64.0 in | Wt 144.8 lb

## 2022-05-26 DIAGNOSIS — J0110 Acute frontal sinusitis, unspecified: Secondary | ICD-10-CM

## 2022-05-26 DIAGNOSIS — Z1211 Encounter for screening for malignant neoplasm of colon: Secondary | ICD-10-CM | POA: Diagnosis not present

## 2022-05-26 MED ORDER — AMOXICILLIN-POT CLAVULANATE 875-125 MG PO TABS: 1.0000 | ORAL_TABLET | Freq: Two times a day (BID) | ORAL | 0 refills | Status: DC

## 2022-05-26 NOTE — Progress Notes (Signed)
New Patient Office Visit  Subjective    Patient ID: Sierra Hensley, female    DOB: August 26, 1971  Age: 51 y.o. MRN: [id number]  CC:  Chief Complaint  Patient presents with   Establish Care    Np. Est care. Pt c/o frontal lobe headaches, body aches, runny nose, and chills in the pm x2 wks    HPI Sierra Hensley presents for new patient visit to establish care.  Introduced to Publishing rights manager role and practice setting.  All questions answered.  Discussed provider/patient relationship and expectations. Visit completed with in person interpreter.   She has been having headaches. 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 She has also noticed body aches, chills and runny nose for 2 weeks. She also has noticed fatigue. She denies shortness of breath, sore throat, ear pain, and chest pain. She has tried a liquid flu medication which helps for the short term.   Depression Screen done:     05/26/2022    2:02 PM 07/26/2018    2:34 PM 06/20/2018    9:44 AM 05/15/2018    9:03 AM 11/06/2014    2:08 PM  Depression screen PHQ 2/9  Decreased Interest 0 0 0 0 0  Down, Depressed, Hopeless 0 0 0 0 0  PHQ - 2 Score 0 0 0 0 0    Outpatient Encounter Medications as of 05/26/2022  Medication Sig   amoxicillin-clavulanate (AUGMENTIN) 875-125 MG tablet Take 1 tablet by mouth 2 (two) times daily.   OMEPRAZOLE PO    [DISCONTINUED] cetirizine (ZYRTEC) 10 MG tablet Take 10 mg by mouth daily as needed for allergies.   [DISCONTINUED] cyclobenzaprine (FLEXERIL) 10 MG tablet Take 1 tablet (10 mg total) by mouth at bedtime. Take as needed for neck soreness.   [DISCONTINUED] fluticasone (FLONASE) 50 MCG/ACT nasal spray Place 1 spray into both nostrils daily as needed for allergies.   [DISCONTINUED] guaifenesin (HUMIBID E) 400 MG TABS tablet Take 1 tablet 3 times daily as needed for chest congestion and cough   [DISCONTINUED] ibuprofen (ADVIL) 400 MG tablet Take 1 tablet (400 mg total) by  mouth every 8 (eight) hours as needed for up to 30 doses.   [DISCONTINUED] ipratropium (ATROVENT) 0.06 % nasal spray Place 2 sprays into both nostrils 4 (four) times daily. As needed for nasal congestion, runny nose   [DISCONTINUED] meloxicam (MOBIC) 15 MG tablet Take 1 tablet (15 mg total) by mouth daily.   [DISCONTINUED] metoCLOPramide (REGLAN) 10 MG tablet Take 1 tablet (10 mg total) by mouth 3 (three) times daily before meals. (Patient not taking: No sig reported)   [DISCONTINUED] Omega-3 Fatty Acids (FISH OIL) 1000 MG CAPS Take 1,000 mg by mouth daily.   [DISCONTINUED] pantoprazole (PROTONIX) 40 MG tablet Take 40 mg by mouth daily.   [DISCONTINUED] promethazine-dextromethorphan (PROMETHAZINE-DM) 6.25-15 MG/5ML syrup Take 5 mLs by mouth 4 (four) times daily as needed for cough.   [DISCONTINUED] RESTASIS 0.05 % ophthalmic emulsion Place 1 drop into both eyes 2 (two) times daily.   [DISCONTINUED] vitamin E 1000 UNIT capsule Take 1,000 Units by mouth daily.   No facility-administered encounter medications on file as of 05/26/2022.    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    Past Surgical History:  Procedure Laterality Date   CESAREAN SECTION MULTI-GESTATIONAL N/A 12/09/2014   Procedure: CESAREAN SECTION MULTI-GESTATIONAL;  Surgeon: Lavina Hamman, MD;  Location: [facility];  Service: Obstetrics;  Laterality: N/A;   HERNIA REPAIR     LAPAROSCOPIC APPENDECTOMY N/A 10/03/2018   Procedure: APPENDECTOMY LAPAROSCOPIC ERAS PATHWAY;  Surgeon: Kinsinger, De Blanch, MD;  Location: [facility];  Service: General;  Laterality: N/A;   LASIK Bilateral     Family History  Family history unknown: Yes    Social History   Socioeconomic History   Marital status: Divorced    Spouse name: Not on file   Number of children: 3   Years of education: Not on file   Highest education level: Not on file  Occupational History   Not on file  Tobacco Use   Smoking  status: Never   Smokeless tobacco: Never  Vaping Use   Vaping Use: Never used  Substance and Sexual Activity   Alcohol use: No   Drug use: No   Sexual activity: Yes  Other Topics Concern   Not on file  Social History Narrative   ** Merged History Encounter **       Social Determinants of Health   Financial Resource Strain: Not on file  Food Insecurity: Not on file  Transportation Needs: Not on file  Physical Activity: Not on file  Stress: Not on file  Social Connections: Not on file  Intimate Partner Violence: Not on file    Review of Systems  Constitutional:  Positive for malaise/fatigue. Negative for chills and fever.  HENT:  Positive for congestion and sinus pain. Negative for ear discharge, ear pain and sore throat.   Eyes: Negative.   Respiratory: Negative.    Cardiovascular: Negative.   Gastrointestinal: Negative.   Genitourinary: Negative.   Musculoskeletal:  Positive for myalgias.  Neurological:  Positive for headaches. Negative for dizziness.  Psychiatric/Behavioral: Negative.       Objective    BP 104/70   Pulse 78   Temp 97.6 F (36.4 C) (Temporal)   Ht 5\' 4"  (1.626 m)   Wt 144 lb 12.8 oz (65.7 kg)   SpO2 98%   BMI 24.85 kg/m   Physical Exam Vitals and nursing note reviewed.  Constitutional:      General: She is not in acute distress.    Appearance: Normal appearance.  HENT:     Head: Normocephalic.     Right Ear: Tympanic membrane, ear canal and external ear normal.     Left Ear: Tympanic membrane, ear canal and external ear normal.     Nose: Congestion present.     Right Sinus: Frontal sinus tenderness present.     Left Sinus: Frontal sinus tenderness present.     Mouth/Throat:     Mouth: Mucous membranes are moist.     Pharynx: No posterior oropharyngeal erythema.  Eyes:     Conjunctiva/sclera: Conjunctivae normal.  Cardiovascular:     Rate and Rhythm: Normal rate and regular rhythm.     Pulses: Normal pulses.     Heart sounds:  Normal heart sounds.  Pulmonary:     Effort: Pulmonary effort is normal.     Breath sounds: Normal breath sounds.  Abdominal:     Palpations: Abdomen is soft.     Tenderness: There is no abdominal tenderness.  Musculoskeletal:     Cervical back: Normal range of motion and neck supple. No tenderness.  Lymphadenopathy:     Cervical: No cervical adenopathy.  Skin:    General: Skin is warm.  Neurological:     General: No focal deficit present.     Mental Status: She is alert and oriented to person,  place, and time.  Psychiatric:        Mood and Affect: Mood normal.        Behavior: Behavior normal.        Thought Content: Thought content normal.        Judgment: Judgment normal.      Assessment & Plan:   Problem List Items Addressed This Visit   None Visit Diagnoses     Acute non-recurrent frontal sinusitis    -  Primary   Symptoms x2 weeks. Start augmentin twice a day for 10 days. Encourage fluids, can take flonase and otc flu medication.    Relevant Medications   amoxicillin-clavulanate (AUGMENTIN) 875-125 MG tablet   Colon cancer screening       Colon cancer screening options discussed, cologuard ordered   Relevant Orders   Cologuard       Return in about 4 weeks (around 06/23/2022) for 1-2 months , CPE.   Charyl Dancer, NP

## 2022-05-26 NOTE — Patient Instructions (Signed)
It was great to see you!  Start augmentin 1 tablet twice a day with food for 10 days. You can keep taking the flu medicine and you can also start flonase nasal spray daily.   I have ordered the cologuard, this kit will get mailed to your house.   Let's follow-up in 1-2 months, sooner if you have concerns.  If a referral was placed today, you will be contacted for an appointment. Please note that routine referrals can sometimes take up to 3-4 weeks to process. Please call our office if you haven't heard anything after this time frame.  Take care,  Lauren McElwee, NP  

## 2022-06-09 DIAGNOSIS — Z1211 Encounter for screening for malignant neoplasm of colon: Secondary | ICD-10-CM | POA: Diagnosis not present

## 2022-06-23 DIAGNOSIS — Z1389 Encounter for screening for other disorder: Secondary | ICD-10-CM | POA: Diagnosis not present

## 2022-06-23 DIAGNOSIS — Z6824 Body mass index (BMI) 24.0-24.9, adult: Secondary | ICD-10-CM | POA: Diagnosis not present

## 2022-06-23 DIAGNOSIS — Z1231 Encounter for screening mammogram for malignant neoplasm of breast: Secondary | ICD-10-CM | POA: Diagnosis not present

## 2022-06-23 DIAGNOSIS — N925 Other specified irregular menstruation: Secondary | ICD-10-CM | POA: Diagnosis not present

## 2022-06-23 DIAGNOSIS — Z130 Encounter for screening for diseases of the blood and blood-forming organs and certain disorders involving the immune mechanism: Secondary | ICD-10-CM | POA: Diagnosis not present

## 2022-06-23 DIAGNOSIS — Z01419 Encounter for gynecological examination (general) (routine) without abnormal findings: Secondary | ICD-10-CM | POA: Diagnosis not present

## 2022-06-23 LAB — HM MAMMOGRAPHY

## 2022-06-27 LAB — COLOGUARD: COLOGUARD: NEGATIVE

## 2022-07-01 ENCOUNTER — Other Ambulatory Visit: Payer: Self-pay | Admitting: Obstetrics and Gynecology

## 2022-07-01 DIAGNOSIS — N6489 Other specified disorders of breast: Secondary | ICD-10-CM

## 2022-07-01 NOTE — Progress Notes (Signed)
Interpreter services used during this phone call. Called pt twice, detailed vm left on home phone [phone].

## 2022-07-16 NOTE — Progress Notes (Unsigned)
There were no vitals taken for this visit.   Subjective:    Patient ID: Sierra Hensley, female    DOB: 1971-11-12, 51 y.o.   MRN: [id number]  CC: No chief complaint on file.  HPI: Sierra Hensley is a 51 y.o. female presenting on 07/18/2022 for comprehensive medical examination. Current medical complaints include:{Blank single:19197::"none","***"}  She currently lives with: Menopausal Symptoms: {Blank single:19197::"yes","no"}  Depression Screen done today and results listed below:     05/26/2022    2:02 PM 07/26/2018    2:34 PM 06/20/2018    9:44 AM 05/15/2018    9:03 AM 11/06/2014    2:08 PM  Depression screen PHQ 2/9  Decreased Interest 0 0 0 0 0  Down, Depressed, Hopeless 0 0 0 0 0  PHQ - 2 Score 0 0 0 0 0    The patient {has/does not have:19849} a history of falls. I {did/did not:19850} complete a risk assessment for falls. A plan of care for falls {was/was not:19852} documented.   Past Medical History:  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    Surgical History:  Past Surgical History:  Procedure Laterality Date   CESAREAN SECTION MULTI-GESTATIONAL N/A 12/09/2014   Procedure: CESAREAN SECTION MULTI-GESTATIONAL;  Surgeon: Lavina Hamman, MD;  Location: [facility];  Service: Obstetrics;  Laterality: N/A;   HERNIA REPAIR     LAPAROSCOPIC APPENDECTOMY N/A 10/03/2018   Procedure: APPENDECTOMY LAPAROSCOPIC ERAS PATHWAY;  Surgeon: Rodman Pickle, MD;  Location: [facility];  Service: General;  Laterality: N/A;   LASIK Bilateral     Medications:  Current Outpatient Medications on File Prior to Visit  Medication Sig   amoxicillin-clavulanate (AUGMENTIN) 875-125 MG tablet Take 1 tablet by mouth 2 (two) times daily.   OMEPRAZOLE PO    [DISCONTINUED] metoCLOPramide (REGLAN) 10 MG tablet Take 1 tablet (10 mg total) by mouth 3 (three) times daily before meals. (Patient not taking: No sig reported)   No current facility-administered  medications on file prior to visit.    Allergies:  No Known Allergies  Social History:   Social History   Tobacco Use  Smoking Status Never  Smokeless Tobacco Never   Social History   Substance and Sexual Activity  Alcohol Use No    Family History:  Family History  Family history unknown: Yes    Past medical history, surgical history, medications, allergies, family history and social history reviewed with patient today and changes made to appropriate areas of the chart.   ROS All other ROS negative except what is listed above and in the HPI.      Objective:    There were no vitals taken for this visit.  Wt Readings from Last 3 Encounters:  05/26/22 144 lb 12.8 oz (65.7 kg)  10/18/18 125 lb 14.4 oz (57.1 kg)  10/16/18 122 lb 9.6 oz (55.6 kg)    Physical Exam  Results for orders placed or performed in visit on 05/26/22  Cologuard  Result Value Ref Range   COLOGUARD Negative Negative      Assessment & Plan:   Problem List Items Addressed This Visit   None    Follow up plan: No follow-ups on file.   LABORATORY TESTING:  - Pap smear: done elsewhere - requesting records  IMMUNIZATIONS:   - Tdap: Tetanus vaccination status reviewed: last tetanus booster within 10 years. - Influenza: {Blank single:19197::"Up to date","Administered today","Postponed to flu season","Refused","Given elsewhere"} - Pneumovax: Not applicable -  Prevnar: Not applicable - HPV: Not applicable - Zostavax vaccine: {Blank single:19197::"Up to date","Administered today","Not applicable","Refused","Given elsewhere"}  SCREENING: -Mammogram:  Scheduled 08/03/22   - Colonoscopy: Up to date  - Bone Density: Not applicable  -Hearing Test: Not applicable  -Spirometry: Not applicable   PATIENT COUNSELING:   Advised to take 1 mg of folate supplement per day if capable of pregnancy.   Sexuality: Discussed sexually transmitted diseases, partner selection, use of condoms, avoidance of  unintended pregnancy  and contraceptive alternatives.   Advised to avoid cigarette smoking.  I discussed with the patient that most people either abstain from alcohol or drink within safe limits (<=14/week and <=4 drinks/occasion for males, <=7/weeks and <= 3 drinks/occasion for females) and that the risk for alcohol disorders and other health effects rises proportionally with the number of drinks per week and how often a drinker exceeds daily limits.  Discussed cessation/primary prevention of drug use and availability of treatment for abuse.   Diet: Encouraged to adjust caloric intake to maintain  or achieve ideal body weight, to reduce intake of dietary saturated fat and total fat, to limit sodium intake by avoiding high sodium foods and not adding table salt, and to maintain adequate dietary potassium and calcium preferably from fresh fruits, vegetables, and low-fat dairy products.    stressed the importance of regular exercise  Injury prevention: Discussed safety belts, safety helmets, smoke detector, smoking near bedding or upholstery.   Dental health: Discussed importance of regular tooth brushing, flossing, and dental visits.    NEXT PREVENTATIVE PHYSICAL DUE IN 1 YEAR. No follow-ups on file.

## 2022-07-18 ENCOUNTER — Ambulatory Visit (INDEPENDENT_AMBULATORY_CARE_PROVIDER_SITE_OTHER): Payer: 59 | Admitting: Nurse Practitioner

## 2022-07-18 ENCOUNTER — Encounter: Payer: Self-pay | Admitting: Nurse Practitioner

## 2022-07-18 VITALS — BP 110/78 | HR 76 | Temp 96.5°F | Wt 146.2 lb

## 2022-07-18 DIAGNOSIS — J40 Bronchitis, not specified as acute or chronic: Secondary | ICD-10-CM | POA: Diagnosis not present

## 2022-07-18 DIAGNOSIS — K219 Gastro-esophageal reflux disease without esophagitis: Secondary | ICD-10-CM

## 2022-07-18 DIAGNOSIS — Z23 Encounter for immunization: Secondary | ICD-10-CM

## 2022-07-18 DIAGNOSIS — Z13220 Encounter for screening for lipoid disorders: Secondary | ICD-10-CM | POA: Diagnosis not present

## 2022-07-18 DIAGNOSIS — Z0000 Encounter for general adult medical examination without abnormal findings: Secondary | ICD-10-CM | POA: Diagnosis not present

## 2022-07-18 LAB — LIPID PANEL
Cholesterol: 212 mg/dL — ABNORMAL HIGH (ref 0–200)
HDL: 62.8 mg/dL (ref >39.00)
LDL Cholesterol: 125 mg/dL — ABNORMAL HIGH (ref 0–99)
NonHDL: 149.22
Total CHOL/HDL Ratio: 3
Triglycerides: 121 mg/dL (ref 0.0–149.0)
VLDL: 24.2 mg/dL (ref 0.0–40.0)

## 2022-07-18 LAB — COMPREHENSIVE METABOLIC PANEL
ALT: 31 U/L (ref 0–35)
AST: 25 U/L (ref 0–37)
Albumin: 4 g/dL (ref 3.5–5.2)
Alkaline Phosphatase: 66 U/L (ref 39–117)
BUN: 19 mg/dL (ref 6–23)
CO2: 29 mEq/L (ref 19–32)
Calcium: 9.3 mg/dL (ref 8.4–10.5)
Chloride: 102 mEq/L (ref 96–112)
Creatinine, Ser: 0.7 mg/dL (ref 0.40–1.20)
GFR: 100.82 mL/min (ref >60.00)
Glucose, Bld: 88 mg/dL (ref 70–99)
Potassium: 4.7 mEq/L (ref 3.5–5.1)
Sodium: 138 mEq/L (ref 135–145)
Total Bilirubin: 0.5 mg/dL (ref 0.2–1.2)
Total Protein: 7.3 g/dL (ref 6.0–8.3)

## 2022-07-18 LAB — CBC
HCT: 39.4 % (ref 36.0–46.0)
Hemoglobin: 12.9 g/dL (ref 12.0–15.0)
MCHC: 32.7 g/dL (ref 30.0–36.0)
MCV: 88.6 fl (ref 78.0–100.0)
Platelets: 258 10*3/uL (ref 150.0–400.0)
RBC: 4.44 Mil/uL (ref 3.87–5.11)
RDW: 13.1 % (ref 11.5–15.5)
WBC: 6.2 10*3/uL (ref 4.0–10.5)

## 2022-07-18 MED ORDER — OMEPRAZOLE 20 MG PO CPDR: 20.0000 mg | DELAYED_RELEASE_CAPSULE | Freq: Every day | ORAL | 3 refills | Status: DC

## 2022-07-18 MED ORDER — BENZONATATE 100 MG PO CAPS: 100.0000 mg | ORAL_CAPSULE | Freq: Three times a day (TID) | ORAL | 0 refills | Status: DC | PRN

## 2022-07-18 MED ORDER — LORATADINE 10 MG PO TABS: 10.0000 mg | ORAL_TABLET | Freq: Every day | ORAL | 11 refills | Status: DC

## 2022-07-18 NOTE — Assessment & Plan Note (Signed)
Chronic, stable. Continue omeprazole 20mg  daily. Refill sent to the pharmacy.

## 2022-07-18 NOTE — Patient Instructions (Addendum)
It was great to see you!  We are checking your labs today and will let you know the results via mychart/phone.   Start tessalon 3 times as needed for cough  Start loratadine once a day for scratchy throat  Start omeprazole once a day for acid reflux   Let's follow-up in 1 year, sooner if you have concerns.  If a referral was placed today, you will be contacted for an appointment. Please note that routine referrals can sometimes take up to 3-4 weeks to process. Please call our office if you haven't heard anything after this time frame.  Take care,  Rodman Pickle, NP

## 2022-07-25 NOTE — Progress Notes (Signed)
Interpreter services used during this phone call. Called and informed patient of results and provider instructions. Patient voiced understanding. Sw, cma

## 2022-08-03 ENCOUNTER — Ambulatory Visit: Payer: 59

## 2022-08-03 ENCOUNTER — Other Ambulatory Visit: Payer: Self-pay | Admitting: Obstetrics and Gynecology

## 2022-08-03 ENCOUNTER — Ambulatory Visit
Admission: RE | Admit: 2022-08-03 | Discharge: 2022-08-03 | Disposition: A | Discharge status: Home / Self Care | Payer: 59 | Source: Ambulatory Visit | Attending: Obstetrics and Gynecology | Admitting: Obstetrics and Gynecology

## 2022-08-03 DIAGNOSIS — N6489 Other specified disorders of breast: Secondary | ICD-10-CM

## 2022-08-03 DIAGNOSIS — R922 Inconclusive mammogram: Secondary | ICD-10-CM | POA: Diagnosis not present

## 2022-09-06 ENCOUNTER — Encounter: Payer: 59 | Admitting: Nurse Practitioner

## 2022-09-26 ENCOUNTER — Ambulatory Visit: Payer: 59 | Admitting: Family Medicine

## 2022-10-18 ENCOUNTER — Ambulatory Visit: Payer: 59

## 2022-10-25 ENCOUNTER — Ambulatory Visit: Payer: 59

## 2023-01-27 ENCOUNTER — Ambulatory Visit: Payer: 59 | Admitting: Family Medicine

## 2023-01-30 ENCOUNTER — Ambulatory Visit (INDEPENDENT_AMBULATORY_CARE_PROVIDER_SITE_OTHER): Payer: 59 | Admitting: Nurse Practitioner

## 2023-01-30 ENCOUNTER — Encounter: Payer: Self-pay | Admitting: Nurse Practitioner

## 2023-01-30 VITALS — BP 104/76 | HR 77 | Temp 97.7°F | Ht 64.0 in | Wt 148.8 lb

## 2023-01-30 DIAGNOSIS — R0982 Postnasal drip: Secondary | ICD-10-CM | POA: Diagnosis not present

## 2023-01-30 DIAGNOSIS — Z23 Encounter for immunization: Secondary | ICD-10-CM

## 2023-01-30 MED ORDER — CETIRIZINE HCL 10 MG PO TABS: 10.0000 mg | ORAL_TABLET | Freq: Every day | ORAL | 1 refills | Status: DC

## 2023-01-30 MED ORDER — IPRATROPIUM BROMIDE 0.06 % NA SOLN
NASAL | 1 refills | Status: DC
Start: 2023-01-30 — End: 2024-01-30

## 2023-01-30 MED ORDER — BENZONATATE 100 MG PO CAPS: 100.0000 mg | ORAL_CAPSULE | Freq: Three times a day (TID) | ORAL | 0 refills | Status: DC | PRN

## 2023-01-30 NOTE — Progress Notes (Unsigned)
   Acute Office Visit  Subjective:     Patient ID: Sierra Hensley, female    DOB: 04-Aug-1971, 52 y.o.   MRN: [id number]  Chief Complaint  Patient presents with   Cough    For 10 days   Video interpreter: Zoelynn Pickney [id number]  HPI Patient is in today for cough that has been going on for 10 days.   ROS      Objective:    BP 104/76 (BP Location: Right Arm)   Pulse 77   Temp 97.7 F (36.5 C)   Ht '5\' 4"'$  (1.626 m)   Wt 148 lb 12.8 oz (67.5 kg)   SpO2 98%   BMI 25.54 kg/m  {Vitals History (Optional):23777}  Physical Exam  No results found for any visits on 01/30/23.      Assessment & Plan:   Problem List Items Addressed This Visit   None   No orders of the defined types were placed in this encounter.   No follow-ups on file.  Charyl Dancer, NP

## 2023-01-30 NOTE — Patient Instructions (Signed)
It was great to see you!  Start zyrtec 1 tablet daily for your cough  I have refilled the nasal spray.   I Have also given you some tessalon perles as needed for cough.   Let's follow-up if your symptoms worsen or don't improve.   Take care,  Vance Peper, NP

## 2023-01-31 ENCOUNTER — Encounter: Payer: Self-pay | Admitting: Nurse Practitioner

## 2023-03-03 NOTE — Progress Notes (Unsigned)
   Acute Office Visit  Subjective:     Patient ID: Sierra Hensley, female    DOB: 03-01-1971, 52 y.o.   MRN: [id number]  No chief complaint on file.   HPI Patient is in today for ongoing cough  ROS      Objective:    There were no vitals taken for this visit. {Vitals History (Optional):23777}  Physical Exam  No results found for any visits on 03/06/23.      Assessment & Plan:   Problem List Items Addressed This Visit   None   No orders of the defined types were placed in this encounter.   No follow-ups on file.  Gerre Scull, NP

## 2023-03-06 ENCOUNTER — Ambulatory Visit (INDEPENDENT_AMBULATORY_CARE_PROVIDER_SITE_OTHER): Payer: 59 | Admitting: Nurse Practitioner

## 2023-03-06 ENCOUNTER — Telehealth: Payer: Self-pay

## 2023-03-06 ENCOUNTER — Encounter: Payer: Self-pay | Admitting: Nurse Practitioner

## 2023-03-06 VITALS — BP 102/70 | HR 78 | Temp 97.8°F | Ht 64.0 in | Wt 148.0 lb

## 2023-03-06 DIAGNOSIS — R5383 Other fatigue: Secondary | ICD-10-CM

## 2023-03-06 DIAGNOSIS — L821 Other seborrheic keratosis: Secondary | ICD-10-CM

## 2023-03-06 DIAGNOSIS — R053 Chronic cough: Secondary | ICD-10-CM

## 2023-03-06 DIAGNOSIS — M25561 Pain in right knee: Secondary | ICD-10-CM | POA: Diagnosis not present

## 2023-03-06 LAB — VITAMIN B12: Vitamin B-12: 1150 pg/mL — ABNORMAL HIGH (ref 211–911)

## 2023-03-06 LAB — COMPREHENSIVE METABOLIC PANEL
ALT: 21 U/L (ref 0–35)
AST: 33 U/L (ref 0–37)
Albumin: 4.4 g/dL (ref 3.5–5.2)
Alkaline Phosphatase: 66 U/L (ref 39–117)
BUN: 15 mg/dL (ref 6–23)
CO2: 23 mEq/L (ref 19–32)
Calcium: 9.4 mg/dL (ref 8.4–10.5)
Chloride: 104 mEq/L (ref 96–112)
Creatinine, Ser: 0.67 mg/dL (ref 0.40–1.20)
GFR: 101.44 mL/min (ref >60.00)
Glucose, Bld: 83 mg/dL (ref 70–99)
Potassium: 6.9 mEq/L (ref 3.5–5.1)
Sodium: 138 mEq/L (ref 135–145)
Total Bilirubin: 0.5 mg/dL (ref 0.2–1.2)
Total Protein: 7.6 g/dL (ref 6.0–8.3)

## 2023-03-06 LAB — CBC WITH DIFFERENTIAL/PLATELET
Basophils Absolute: 0 10*3/uL (ref 0.0–0.1)
Basophils Relative: 0.8 % (ref 0.0–3.0)
Eosinophils Absolute: 0.4 10*3/uL (ref 0.0–0.7)
Eosinophils Relative: 7.3 % — ABNORMAL HIGH (ref 0.0–5.0)
HCT: 40.4 % (ref 36.0–46.0)
Hemoglobin: 13.4 g/dL (ref 12.0–15.0)
Lymphocytes Relative: 28.2 % (ref 12.0–46.0)
Lymphs Abs: 1.5 10*3/uL (ref 0.7–4.0)
MCHC: 33.1 g/dL (ref 30.0–36.0)
MCV: 88.5 fl (ref 78.0–100.0)
Monocytes Absolute: 0.3 10*3/uL (ref 0.1–1.0)
Monocytes Relative: 5.6 % (ref 3.0–12.0)
Neutro Abs: 3.2 10*3/uL (ref 1.4–7.7)
Neutrophils Relative %: 58.1 % (ref 43.0–77.0)
Platelets: 251 10*3/uL (ref 150.0–400.0)
RBC: 4.57 Mil/uL (ref 3.87–5.11)
RDW: 12.9 % (ref 11.5–15.5)
WBC: 5.5 10*3/uL (ref 4.0–10.5)

## 2023-03-06 LAB — TSH: TSH: 2.06 u[IU]/mL (ref 0.35–5.50)

## 2023-03-06 LAB — VITAMIN D 25 HYDROXY (VIT D DEFICIENCY, FRACTURES): VITD: 22.12 ng/mL — ABNORMAL LOW (ref 30.00–100.00)

## 2023-03-06 MED ORDER — GUAIFENESIN ER 600 MG PO TB12: 600.0000 mg | ORAL_TABLET | Freq: Two times a day (BID) | ORAL | 0 refills | Status: DC | PRN

## 2023-03-06 MED ORDER — FLUTICASONE PROPIONATE 50 MCG/ACT NA SUSP: 1.0000 | Freq: Every day | NASAL | 0 refills | Status: DC

## 2023-03-06 MED ORDER — OMEPRAZOLE 20 MG PO CPDR: 20.0000 mg | DELAYED_RELEASE_CAPSULE | Freq: Every day | ORAL | 3 refills | Status: DC

## 2023-03-06 NOTE — Telephone Encounter (Signed)
CRITICAL VALUE STICKER  CRITICAL VALUE:6.9 Potassium hemolyzed   RECEIVER (on-site recipient of call): Mirian Capuchin, RMA  DATE & TIME NOTIFIED: 04:55 PM  MESSENGER (representative from lab): [facility]  MD NOTIFIED: Yes   TIME OF NOTIFICATION:04:59 pm

## 2023-03-06 NOTE — Assessment & Plan Note (Addendum)
Chronic improving. Continue flonase and zyrtec daily. Start mucinex twice a day for cough and sputum. Encourage fluids. Follow-up in 4-5 months, sooner with concerns.

## 2023-03-06 NOTE — Patient Instructions (Signed)
It was great to see you!  Start stretches daily for your knee. You can try turmeric to see if this helps with your pain. You can use voltaren gel 4 times a day as needed for the knee.   Start mucinex twice a day for your cough. Keep taking the zyrtec daily and using your nasal spray.   Let's follow-up in 5 months, sooner if you have concerns.  If a referral was placed today, you will be contacted for an appointment. Please note that routine referrals can sometimes take up to 3-4 weeks to process. Please call our office if you haven't heard anything after this time frame.  Take care,  Rodman Pickle, NP

## 2023-03-07 LAB — IRON,TIBC AND FERRITIN PANEL
%SAT: 26 % (calc) (ref 16–45)
Ferritin: 54 ng/mL (ref 16–232)
Iron: 86 ug/dL (ref 45–160)
TIBC: 332 mcg/dL (calc) (ref 250–450)

## 2023-03-07 NOTE — Telephone Encounter (Signed)
Noted  

## 2023-03-07 NOTE — Telephone Encounter (Signed)
I spoke with patient and she will come in tomorrow morning to have labs redrawn.

## 2023-03-08 ENCOUNTER — Other Ambulatory Visit: Payer: 59

## 2023-03-08 ENCOUNTER — Telehealth: Payer: Self-pay

## 2023-03-08 LAB — BASIC METABOLIC PANEL
BUN: 19 mg/dL (ref 6–23)
CO2: 25 mEq/L (ref 19–32)
Calcium: 8.8 mg/dL (ref 8.4–10.5)
Chloride: 104 mEq/L (ref 96–112)
Creatinine, Ser: 0.86 mg/dL (ref 0.40–1.20)
GFR: 78.4 mL/min (ref >60.00)
Glucose, Bld: 162 mg/dL — ABNORMAL HIGH (ref 70–99)
Potassium: 4.3 mEq/L (ref 3.5–5.1)
Sodium: 139 mEq/L (ref 135–145)

## 2023-03-08 NOTE — Telephone Encounter (Signed)
Patient walked into office for repeat labs without appointment. Worked patient in for lab draw for repeat bmp. Bmp collected successfully.

## 2023-03-09 ENCOUNTER — Telehealth: Payer: Self-pay | Admitting: Nurse Practitioner

## 2023-03-09 NOTE — Telephone Encounter (Signed)
Patient notified of lab results under lab encounter.

## 2023-03-09 NOTE — Telephone Encounter (Signed)
Pt returned call about her labs.

## 2023-03-10 ENCOUNTER — Other Ambulatory Visit: Payer: 59

## 2023-03-10 ENCOUNTER — Other Ambulatory Visit (HOSPITAL_COMMUNITY): Payer: Self-pay

## 2023-03-13 ENCOUNTER — Telehealth: Payer: Self-pay | Admitting: Nurse Practitioner

## 2023-03-13 NOTE — Telephone Encounter (Signed)
Pt called in stating she missed call from this number. Unable to location notes/documentation of a call. Pt asking is she is supposed to redo blood work from last week.  Please advise.

## 2023-03-13 NOTE — Telephone Encounter (Signed)
I called and LVM stating that I do not see in chart that anyone tried to contact patient but if she has any further questions or concerns to call our office back.

## 2023-07-03 ENCOUNTER — Encounter: Payer: Self-pay | Admitting: Family Medicine

## 2023-07-03 ENCOUNTER — Ambulatory Visit (INDEPENDENT_AMBULATORY_CARE_PROVIDER_SITE_OTHER): Payer: 59 | Admitting: Family Medicine

## 2023-07-03 VITALS — BP 110/68 | HR 88 | Temp 97.9°F | Ht 64.0 in | Wt 149.6 lb

## 2023-07-03 DIAGNOSIS — R053 Chronic cough: Secondary | ICD-10-CM

## 2023-07-03 DIAGNOSIS — K219 Gastro-esophageal reflux disease without esophagitis: Secondary | ICD-10-CM

## 2023-07-03 DIAGNOSIS — J301 Allergic rhinitis due to pollen: Secondary | ICD-10-CM

## 2023-07-03 MED ORDER — MONTELUKAST SODIUM 10 MG PO TABS: 10.0000 mg | ORAL_TABLET | Freq: Every day | ORAL | 3 refills | Status: DC

## 2023-07-03 NOTE — Assessment & Plan Note (Signed)
Likely primarily due to allergic rhinitis. Out of an abundance of caution, I will check a CXR, though I anticipate this will be normal.

## 2023-07-03 NOTE — Progress Notes (Signed)
[facility] [address] [place] [address] Dept: [phone] Dept Fax: [phone]  Office Visit  Subjective:    Patient ID: Sierra Hensley, female    DOB: 1971-06-08, 51 y.o..   MRN: [id number]  Chief Complaint  Patient presents with   Cough    C/o having persistent cough/mucus x 2 months.  Has taken allergy medication with little relief.     Medical Interpreter: Jari Pigg  History of Present Illness:  Patient is in today with ongoing issues with a chronic cough. She was seen in April by Ms. McElwee. She has been provided with omeprazole for possible GERD and cetirizine and Flonase for allergy symptoms. She notes that she is using all of these medications consistently. Her cough still fluctuates at times. She denies any itching eyes. She is taking her omeprazole in the morning.   Past Medical History: Patient Active Problem List   Diagnosis Date Noted   Chronic cough 03/06/2023   Small bowel obstruction (HCC) 10/13/2018   Acute appendicitis with perforation and peritoneal abscess 08/09/2018   Generalized abdominal cramping 08/03/2018   Diarrhea of presumed infectious origin 08/03/2018   History of gestational diabetes 05/15/2018   Gastroesophageal reflux disease 05/15/2018   Past Surgical History:  Procedure Laterality Date   CESAREAN SECTION MULTI-GESTATIONAL N/A 12/09/2014   Procedure: CESAREAN SECTION MULTI-GESTATIONAL;  Surgeon: Lavina Hamman, MD;  Location: [facility];  Service: Obstetrics;  Laterality: N/A;   HERNIA REPAIR     LAPAROSCOPIC APPENDECTOMY N/A 10/03/2018   Procedure: APPENDECTOMY LAPAROSCOPIC ERAS PATHWAY;  Surgeon: Kinsinger, De Blanch, MD;  Location: [facility];  Service: General;  Laterality: N/A;   LASIK Bilateral    Family History  Family history unknown: Yes   Outpatient Medications Prior to Visit  Medication Sig Dispense Refill   cetirizine (ZYRTEC) 10 MG tablet Take 1 tablet (10 mg total) by  mouth daily. 90 tablet 1   cycloSPORINE (RESTASIS) 0.05 % ophthalmic emulsion      fluticasone (FLONASE) 50 MCG/ACT nasal spray Place 1 spray into both nostrils daily. 16 g 0   ipratropium (ATROVENT) 0.06 % nasal spray USE 2 SPRAY(S) IN EACH NOSTRIL 4 TIMES DAILY AS NEEDED FOR NASAL CONGESTION OR RUNNY NOSE 15 mL 1   omeprazole (PRILOSEC) 20 MG capsule Take 1 capsule (20 mg total) by mouth daily. 90 capsule 3   guaiFENesin (MUCINEX) 600 MG 12 hr tablet Take 1 tablet (600 mg total) by mouth 2 (two) times daily as needed for cough or to loosen phlegm. (Patient not taking: Reported on 07/03/2023) 60 tablet 0   No facility-administered medications prior to visit.   No Known Allergies   Objective:   Today's Vitals   07/03/23 0824  BP: 110/68  Pulse: 88  Temp: 97.9 F (36.6 C)  TempSrc: Temporal  SpO2: 99%  Weight: 149 lb 9.6 oz (67.9 kg)  Height: 5\' 4"  (1.626 m)   Body mass index is 25.68 kg/m.   General: Well developed, well nourished. No acute distress. HEENT: Normocephalic, non-traumatic. PERRL, EOMI. Conjunctiva clear. External ears normal. EAC and TMs   normal bilaterally. Nasal mucosa is swollen and pale with some mild rhinorrhea. Mucous membranes moist.   The posterior oropharynx shows cobblestoning and some mucous streaking. Good dentition. Neck: Supple. No lymphadenopathy. No thyromegaly. Lungs: Clear to auscultation bilaterally. No wheezing, rales or rhonchi. CV: RRR without murmurs or rubs. Pulses 2+ bilaterally. Psych: Alert and oriented. Normal mood and affect.  Health Maintenance Due  Topic Date Due   PAP SMEAR-Modifier  05/15/2020     Assessment & Plan:   Problem List Items Addressed This Visit       Respiratory   Allergic rhinitis due to pollen    Ms. Agent's exam is consistent with allergic rhinitis with post-nasal drip as a likely cause of her symptoms. I recommend she continue her cetirizine 10 mg daily and Flonase nasal spray. I will add montelukast 10 mg  daily. I also recommend she start doing daily nasal saline rinses.      Relevant Medications   montelukast (SINGULAIR) 10 MG tablet     Digestive   Gastroesophageal reflux disease    I'm not sure that this is the major cause of the chronic cough. I did recommend that she try taking her omeprazole in the evening, as it may be more effective for her.        Other   Chronic cough - Primary    Likely primarily due to allergic rhinitis. Out of an abundance of caution, I will check a CXR, though I anticipate this will be normal.      Relevant Orders   DG Chest 2 View    Return in about 6 weeks (around 08/14/2023) for Reassessment with PCP.   Loyola Mast, MD

## 2023-07-03 NOTE — Assessment & Plan Note (Signed)
Ms. Gallaway's exam is consistent with allergic rhinitis with post-nasal drip as a likely cause of her symptoms. I recommend she continue her cetirizine 10 mg daily and Flonase nasal spray. I will add montelukast 10 mg daily. I also recommend she start doing daily nasal saline rinses.

## 2023-07-03 NOTE — Patient Instructions (Signed)
Recommend nasal saline washes daily, such as NeilMed Sinus Rinse

## 2023-07-03 NOTE — Assessment & Plan Note (Signed)
I'm not sure that this is the major cause of the chronic cough. I did recommend that she try taking her omeprazole in the evening, as it may be more effective for her.

## 2023-07-10 ENCOUNTER — Other Ambulatory Visit: Payer: Self-pay | Admitting: Nurse Practitioner

## 2023-07-11 ENCOUNTER — Other Ambulatory Visit: Payer: Self-pay | Admitting: Nurse Practitioner

## 2023-07-13 NOTE — Telephone Encounter (Signed)
Requesting: Fluticasone Propionate 50 MCG/ACT Nasal Suspension  Last Visit: 03/06/2023 Next Visit: 07/25/2023 Last Refill: 03/06/2023  Please Advise

## 2023-07-14 NOTE — Telephone Encounter (Signed)
Requesting: EQ CETIRIZINE 10MG  TAB  Last Visit: 03/06/2023 Next Visit: 07/25/2023 Last Refill: 3//2024  Please Advise

## 2023-07-20 ENCOUNTER — Other Ambulatory Visit: Payer: Self-pay

## 2023-07-20 MED ORDER — FLUTICASONE PROPIONATE 50 MCG/ACT NA SUSP: 2.0000 | Freq: Every day | NASAL | 0 refills | Status: DC

## 2023-07-20 NOTE — Telephone Encounter (Signed)
Requesting: Fluticasone Nasal Last Visit: 07/03/2023 with Dr. Veto Kemps Next Visit: 07/22/2023 Last Refill: 07/13/2023  Please Advise

## 2023-07-25 ENCOUNTER — Encounter: Payer: Self-pay | Admitting: Nurse Practitioner

## 2023-07-25 ENCOUNTER — Ambulatory Visit (INDEPENDENT_AMBULATORY_CARE_PROVIDER_SITE_OTHER): Payer: 59 | Admitting: Nurse Practitioner

## 2023-07-25 VITALS — BP 102/84 | HR 72 | Temp 97.7°F | Ht 64.5 in | Wt 149.8 lb

## 2023-07-25 DIAGNOSIS — Z6825 Body mass index (BMI) 25.0-25.9, adult: Secondary | ICD-10-CM | POA: Diagnosis not present

## 2023-07-25 DIAGNOSIS — R7301 Impaired fasting glucose: Secondary | ICD-10-CM | POA: Diagnosis not present

## 2023-07-25 DIAGNOSIS — K219 Gastro-esophageal reflux disease without esophagitis: Secondary | ICD-10-CM

## 2023-07-25 DIAGNOSIS — Z1231 Encounter for screening mammogram for malignant neoplasm of breast: Secondary | ICD-10-CM

## 2023-07-25 DIAGNOSIS — E559 Vitamin D deficiency, unspecified: Secondary | ICD-10-CM | POA: Diagnosis not present

## 2023-07-25 DIAGNOSIS — E7800 Pure hypercholesterolemia, unspecified: Secondary | ICD-10-CM | POA: Diagnosis not present

## 2023-07-25 DIAGNOSIS — Z01419 Encounter for gynecological examination (general) (routine) without abnormal findings: Secondary | ICD-10-CM | POA: Diagnosis not present

## 2023-07-25 DIAGNOSIS — J301 Allergic rhinitis due to pollen: Secondary | ICD-10-CM | POA: Diagnosis not present

## 2023-07-25 DIAGNOSIS — N925 Other specified irregular menstruation: Secondary | ICD-10-CM | POA: Diagnosis not present

## 2023-07-25 DIAGNOSIS — Z0000 Encounter for general adult medical examination without abnormal findings: Secondary | ICD-10-CM | POA: Insufficient documentation

## 2023-07-25 DIAGNOSIS — Z1389 Encounter for screening for other disorder: Secondary | ICD-10-CM | POA: Diagnosis not present

## 2023-07-25 LAB — COMPREHENSIVE METABOLIC PANEL
ALT: 28 U/L (ref 0–35)
AST: 20 U/L (ref 0–37)
Albumin: 3.9 g/dL (ref 3.5–5.2)
Alkaline Phosphatase: 62 U/L (ref 39–117)
BUN: 13 mg/dL (ref 6–23)
CO2: 31 mEq/L (ref 19–32)
Calcium: 9.1 mg/dL (ref 8.4–10.5)
Chloride: 101 mEq/L (ref 96–112)
Creatinine, Ser: 0.7 mg/dL (ref 0.40–1.20)
GFR: 100.1 mL/min (ref >60.00)
Glucose, Bld: 73 mg/dL (ref 70–99)
Potassium: 4.2 mEq/L (ref 3.5–5.1)
Sodium: 140 mEq/L (ref 135–145)
Total Bilirubin: 0.6 mg/dL (ref 0.2–1.2)
Total Protein: 6.8 g/dL (ref 6.0–8.3)

## 2023-07-25 LAB — VITAMIN D 25 HYDROXY (VIT D DEFICIENCY, FRACTURES): VITD: 16.93 ng/mL — ABNORMAL LOW (ref 30.00–100.00)

## 2023-07-25 LAB — CBC WITH DIFFERENTIAL/PLATELET
Basophils Absolute: 0 10*3/uL (ref 0.0–0.1)
Basophils Relative: 0.7 % (ref 0.0–3.0)
Eosinophils Absolute: 0.4 10*3/uL (ref 0.0–0.7)
Eosinophils Relative: 7.7 % — ABNORMAL HIGH (ref 0.0–5.0)
HCT: 40.6 % (ref 36.0–46.0)
Hemoglobin: 13 g/dL (ref 12.0–15.0)
Lymphocytes Relative: 30.2 % (ref 12.0–46.0)
Lymphs Abs: 1.4 10*3/uL (ref 0.7–4.0)
MCHC: 32.1 g/dL (ref 30.0–36.0)
MCV: 89.8 fl (ref 78.0–100.0)
Monocytes Absolute: 0.3 10*3/uL (ref 0.1–1.0)
Monocytes Relative: 7.4 % (ref 3.0–12.0)
Neutro Abs: 2.5 10*3/uL (ref 1.4–7.7)
Neutrophils Relative %: 54 % (ref 43.0–77.0)
Platelets: 222 10*3/uL (ref 150.0–400.0)
RBC: 4.53 Mil/uL (ref 3.87–5.11)
RDW: 12.5 % (ref 11.5–15.5)
WBC: 4.5 10*3/uL (ref 4.0–10.5)

## 2023-07-25 LAB — LIPID PANEL
Cholesterol: 181 mg/dL (ref 0–200)
HDL: 57 mg/dL (ref >39.00)
LDL Cholesterol: 98 mg/dL (ref 0–99)
NonHDL: 123.85
Total CHOL/HDL Ratio: 3
Triglycerides: 130 mg/dL (ref 0.0–149.0)
VLDL: 26 mg/dL (ref 0.0–40.0)

## 2023-07-25 LAB — HEMOGLOBIN A1C: Hgb A1c MFr Bld: 5.7 % (ref 4.6–6.5)

## 2023-07-25 NOTE — Assessment & Plan Note (Signed)
Health maintenance reviewed and updated. Discussed nutrition, exercise. Check CMP, CBC today. Follow-up 1 year.   

## 2023-07-25 NOTE — Assessment & Plan Note (Signed)
Chronic, improving.  She states that if she takes her medication, she is not having any trouble with the mucus.  Spirometry done today in office was normal.  Continue Zyrtec 10 mg daily, montelukast 10 mg daily, Flonase nasal spray daily.  She can also start taking Mucinex over-the-counter as needed for worsening symptoms.  Follow-up in 6 months.

## 2023-07-25 NOTE — Progress Notes (Signed)
BP 102/84 (BP Location: Left Arm)   Pulse 72   Temp 97.7 F (36.5 C)   Ht 5' 4.5" (1.638 m)   Wt 149 lb 12.8 oz (67.9 kg)   SpO2 97%   BMI 25.32 kg/m    Subjective:    Patient ID: Sierra Hensley, female    DOB: 16-Oct-1971, 52 y.o.   MRN: [id number]  CC: Chief Complaint  Patient presents with   Annual Exam    With lab work-patient is not fasting, concerns with mucus build up in throat    HPI: Sierra Hensley is a 52 y.o. female presenting on 07/25/2023 for comprehensive medical examination. Current medical complaints include: ongoing post nasal drip  She states that she has been having ongoing postnasal drip if she does not take her medications.  She has been taking Zyrtec 10 mg daily, montelukast 10 mg daily, and Flonase daily.  She states that the Mucinex was not covered by her insurance so she did not pick this up.  She states that she has some shortness of breath and wheezing if she does not take the medication.  She is concerned about having asthma.  She currently lives with: husband, 2 kids Menopausal Symptoms: yes - no menstrual in 6 months  Depression and Anxiety Screen done today and results listed below:     07/25/2023    8:12 AM 07/18/2022    8:25 AM 05/26/2022    2:02 PM 07/26/2018    2:34 PM 06/20/2018    9:44 AM  Depression screen PHQ 2/9  Decreased Interest 0 0 0 0 0  Down, Depressed, Hopeless 0 0 0 0 0  PHQ - 2 Score 0 0 0 0 0  Altered sleeping 0      Tired, decreased energy 0      Change in appetite 0      Feeling bad or failure about yourself  0      Trouble concentrating 0      Moving slowly or fidgety/restless 0      Suicidal thoughts 0      PHQ-9 Score 0      Difficult doing work/chores Not difficult at all          07/25/2023    8:12 AM  GAD 7 : Generalized Anxiety Score  Nervous, Anxious, on Edge 0  Control/stop worrying 0  Worry too much - different things 0  Trouble relaxing 0  Restless 0  Easily annoyed or irritable 0  Afraid - awful might  happen 0  Total GAD 7 Score 0  Anxiety Difficulty Not difficult at all    The patient does not have a history of falls. I did not complete a risk assessment for falls. A plan of care for falls was not documented.   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   Small bowel obstruction (HCC) 10/13/2018    Surgical History:  Past Surgical History:  Procedure Laterality Date   CESAREAN SECTION MULTI-GESTATIONAL N/A 12/09/2014   Procedure: CESAREAN SECTION MULTI-GESTATIONAL;  Surgeon: Lavina Hamman, MD;  Location: [facility];  Service: Obstetrics;  Laterality: N/A;   HERNIA REPAIR     LAPAROSCOPIC APPENDECTOMY N/A 10/03/2018   Procedure: APPENDECTOMY LAPAROSCOPIC ERAS PATHWAY;  Surgeon: Sheliah Hatch De Blanch, MD;  Location: [facility];  Service: General;  Laterality: N/A;   LASIK Bilateral  Medications:  Current Outpatient Medications on File Prior to Visit  Medication Sig   cetirizine (ZYRTEC) 10 MG tablet Take 1 tablet by mouth once daily   cycloSPORINE (RESTASIS) 0.05 % ophthalmic emulsion    fluticasone (FLONASE) 50 MCG/ACT nasal spray Place 2 sprays into both nostrils daily.   ipratropium (ATROVENT) 0.06 % nasal spray USE 2 SPRAY(S) IN EACH NOSTRIL 4 TIMES DAILY AS NEEDED FOR NASAL CONGESTION OR RUNNY NOSE   montelukast (SINGULAIR) 10 MG tablet Take 1 tablet (10 mg total) by mouth at bedtime.   omeprazole (PRILOSEC) 20 MG capsule Take 1 capsule (20 mg total) by mouth daily.   guaiFENesin (MUCINEX) 600 MG 12 hr tablet Take 1 tablet (600 mg total) by mouth 2 (two) times daily as needed for cough or to loosen phlegm. (Patient not taking: Reported on 07/03/2023)   [DISCONTINUED] metoCLOPramide (REGLAN) 10 MG tablet Take 1 tablet (10 mg total) by mouth 3 (three) times daily before meals. (Patient not taking: No sig reported)   No current  facility-administered medications on file prior to visit.    Allergies:  No Known Allergies  Social History:  Social History   Socioeconomic History   Marital status: Divorced    Spouse name: Not on file   Number of children: 3   Years of education: Not on file   Highest education level: Not on file  Occupational History   Not on file  Tobacco Use   Smoking status: Never   Smokeless tobacco: Never  Vaping Use   Vaping status: Never Used  Substance and Sexual Activity   Alcohol use: No   Drug use: No   Sexual activity: Yes  Other Topics Concern   Not on file  Social History Narrative   ** Merged History Encounter **       Social Determinants of Health   Financial Resource Strain: Not on file  Food Insecurity: No Food Insecurity (02/16/2021)   Received from [facility], [facility]   Hunger Vital Sign    Worried About Running Out of Food in the Last Year: Never true    Ran Out of Food in the Last Year: Never true  Transportation Needs: Not on file  Physical Activity: Not on file  Stress: Not on file  Social Connections: Unknown (04/07/2022)   Received from [facility], [facility]   Social Network    Social Network: Not on file  Intimate Partner Violence: Unknown (03/02/2022)   Received from [facility], [facility]   HITS    Physically Hurt: Not on file    Insult or Talk Down To: Not on file    Threaten Physical Harm: Not on file    Scream or Curse: Not on file   Social History   Tobacco Use  Smoking Status Never  Smokeless Tobacco Never   Social History   Substance and Sexual Activity  Alcohol Use No    Family History:  Family History  Family history unknown: Yes    Past medical history, surgical history, medications, allergies, family history and social history reviewed with patient today and changes made to appropriate areas of the chart.   Review of Systems  Constitutional:  Negative for malaise/fatigue.  HENT:  Negative for  congestion, ear pain and sore throat.        Post nasal drip  Eyes: Negative.   Respiratory:  Positive for shortness of breath (at times) and wheezing (at times). Negative for cough.   Cardiovascular: Negative.  Gastrointestinal: Negative.   Genitourinary: Negative.   Musculoskeletal: Negative.   Skin: Negative.   Neurological: Negative.   Psychiatric/Behavioral: Negative.     All other ROS negative except what is listed above and in the HPI.      Objective:    BP 102/84 (BP Location: Left Arm)   Pulse 72   Temp 97.7 F (36.5 C)   Ht 5' 4.5" (1.638 m)   Wt 149 lb 12.8 oz (67.9 kg)   SpO2 97%   BMI 25.32 kg/m   Wt Readings from Last 3 Encounters:  07/25/23 149 lb 12.8 oz (67.9 kg)  07/03/23 149 lb 9.6 oz (67.9 kg)  03/06/23 148 lb (67.1 kg)    Physical Exam Vitals and nursing note reviewed.  Constitutional:      General: She is not in acute distress.    Appearance: Normal appearance.  HENT:     Head: Normocephalic and atraumatic.     Right Ear: Tympanic membrane, ear canal and external ear normal.     Left Ear: Tympanic membrane, ear canal and external ear normal.     Mouth/Throat:     Mouth: Mucous membranes are moist.     Pharynx: Oropharynx is clear. No posterior oropharyngeal erythema.  Eyes:     Conjunctiva/sclera: Conjunctivae normal.  Cardiovascular:     Rate and Rhythm: Normal rate and regular rhythm.     Pulses: Normal pulses.     Heart sounds: Normal heart sounds.  Pulmonary:     Effort: Pulmonary effort is normal.     Breath sounds: Normal breath sounds.  Abdominal:     Palpations: Abdomen is soft.     Tenderness: There is no abdominal tenderness.  Musculoskeletal:        General: Normal range of motion.     Cervical back: Normal range of motion and neck supple.     Right lower leg: No edema.     Left lower leg: No edema.  Lymphadenopathy:     Cervical: No cervical adenopathy.  Skin:    General: Skin is warm and dry.  Neurological:      General: No focal deficit present.     Mental Status: She is alert and oriented to person, place, and time.     Cranial Nerves: No cranial nerve deficit.     Coordination: Coordination normal.     Gait: Gait normal.  Psychiatric:        Mood and Affect: Mood normal.        Behavior: Behavior normal.        Thought Content: Thought content normal.        Judgment: Judgment normal.     Results for orders placed or performed in visit on 07/25/23  HM PAP SMEAR  Result Value Ref Range   HM Pap smear normal   Results Console HPV  Result Value Ref Range   CHL HPV Negative       Assessment & Plan:   Problem List Items Addressed This Visit       Respiratory   Allergic rhinitis due to pollen    Chronic, improving.  She states that if she takes her medication, she is not having any trouble with the mucus.  Spirometry done today in office was normal.  Continue Zyrtec 10 mg daily, montelukast 10 mg daily, Flonase nasal spray daily.  She can also start taking Mucinex over-the-counter as needed for worsening symptoms.  Follow-up in 6 months.  Digestive   Gastroesophageal reflux disease    Chronic, stable.  Continue omeprazole 20 mg daily.        Other   Routine general medical examination at a health care facility - Primary    Health maintenance reviewed and updated. Discussed nutrition, exercise. Check CMP, CBC today. Follow-up 1 year.        Relevant Orders   CBC with Differential/Platelet   Comprehensive metabolic panel   Pure hypercholesterolemia    Chronic, stable.  Check CMP, CBC, lipid panel today and treat based on results.      Relevant Orders   Lipid panel   Vitamin D insufficiency    Will check vitamin D levels and treat based on results      Relevant Orders   VITAMIN D 25 Hydroxy (Vit-D Deficiency, Fractures)   Other Visit Diagnoses     IFG (impaired fasting glucose)       Check A1c today   Relevant Orders   Hemoglobin A1c   Encounter for  screening mammogram for malignant neoplasm of breast       Mammogram scheduled for 08/14/2023 at 9:10 AM at our office   Relevant Orders   MM 3D SCREENING MAMMOGRAM BILATERAL BREAST        Follow up plan: Return in about 6 months (around 01/25/2024) for allergies.   LABORATORY TESTING:  - Pap smear: up to date  IMMUNIZATIONS:   - Tdap: Tetanus vaccination status reviewed: last tetanus booster within 10 years. - Influenza: Refused - Pneumovax: Not applicable - Prevnar: Not applicable - HPV: Not applicable - Shingrix vaccine: Up to date  SCREENING: -Mammogram: Ordered today  - Colonoscopy: Up to date  - Bone Density: Not applicable   PATIENT COUNSELING:   Advised to take 1 mg of folate supplement per day if capable of pregnancy.   Sexuality: Discussed sexually transmitted diseases, partner selection, use of condoms, avoidance of unintended pregnancy  and contraceptive alternatives.   Advised to avoid cigarette smoking.  I discussed with the patient that most people either abstain from alcohol or drink within safe limits (<=14/week and <=4 drinks/occasion for males, <=7/weeks and <= 3 drinks/occasion for females) and that the risk for alcohol disorders and other health effects rises proportionally with the number of drinks per week and how often a drinker exceeds daily limits.  Discussed cessation/primary prevention of drug use and availability of treatment for abuse.   Diet: Encouraged to adjust caloric intake to maintain  or achieve ideal body weight, to reduce intake of dietary saturated fat and total fat, to limit sodium intake by avoiding high sodium foods and not adding table salt, and to maintain adequate dietary potassium and calcium preferably from fresh fruits, vegetables, and low-fat dairy products.    stressed the importance of regular exercise  Injury prevention: Discussed safety belts, safety helmets, smoke detector, smoking near bedding or upholstery.   Dental  health: Discussed importance of regular tooth brushing, flossing, and dental visits.    NEXT PREVENTATIVE PHYSICAL DUE IN 1 YEAR. Return in about 6 months (around 01/25/2024) for allergies.  Kirstine Jacquin A Ynez Eugenio

## 2023-07-25 NOTE — Assessment & Plan Note (Signed)
Will check vitamin D levels and treat based on results.  

## 2023-07-25 NOTE — Assessment & Plan Note (Signed)
Chronic, stable.  Continue omeprazole 20 mg daily. 

## 2023-07-25 NOTE — Assessment & Plan Note (Signed)
Chronic, stable.  Check CMP, CBC, lipid panel today and treat based on results.

## 2023-07-25 NOTE — Patient Instructions (Signed)
It was great to see you!  We are checking your labs today and will let you know the results via mychart/phone.   You are scheduled for a mammogram at our office on 08/14/23 at 9:10am. It will be on the bus in our office.   You can get mucinex over the counter since your insurance doesn't cover it.   Let's follow-up in 6 months, sooner if you have concerns.  If a referral was placed today, you will be contacted for an appointment. Please note that routine referrals can sometimes take up to 3-4 weeks to process. Please call our office if you haven't heard anything after this time frame.  Take care,  Rodman Pickle, NP

## 2023-07-26 MED ORDER — VITAMIN D (ERGOCALCIFEROL) 1.25 MG (50000 UNIT) PO CAPS: 50000.0000 [IU] | ORAL_CAPSULE | ORAL | 1 refills | Status: DC

## 2023-07-26 NOTE — Addendum Note (Signed)
Addended by: Rodman Pickle A on: 07/26/2023 10:49 AM   Modules accepted: Orders

## 2023-08-14 ENCOUNTER — Ambulatory Visit
Admission: RE | Admit: 2023-08-14 | Discharge: 2023-08-14 | Disposition: A | Discharge status: Home / Self Care | Payer: 59 | Source: Ambulatory Visit | Attending: Nurse Practitioner | Admitting: Nurse Practitioner

## 2023-08-14 DIAGNOSIS — Z1231 Encounter for screening mammogram for malignant neoplasm of breast: Secondary | ICD-10-CM

## 2023-08-17 ENCOUNTER — Other Ambulatory Visit: Payer: Self-pay | Admitting: Nurse Practitioner

## 2023-08-17 DIAGNOSIS — R928 Other abnormal and inconclusive findings on diagnostic imaging of breast: Secondary | ICD-10-CM

## 2023-08-25 ENCOUNTER — Telehealth: Payer: Self-pay | Admitting: Nurse Practitioner

## 2023-08-25 NOTE — Telephone Encounter (Signed)
08/25/23 - pt called wanting to sch an appt to see the provider. She said she has fishbone stuck in her throat. PCP advise she goes to either urgent care/ED.

## 2023-09-13 ENCOUNTER — Other Ambulatory Visit: Payer: Self-pay | Admitting: Nurse Practitioner

## 2023-09-13 ENCOUNTER — Ambulatory Visit
Admission: RE | Admit: 2023-09-13 | Discharge: 2023-09-13 | Disposition: A | Discharge status: Home / Self Care | Payer: 59 | Source: Ambulatory Visit | Attending: Nurse Practitioner | Admitting: Nurse Practitioner

## 2023-09-13 DIAGNOSIS — R921 Mammographic calcification found on diagnostic imaging of breast: Secondary | ICD-10-CM

## 2023-09-13 DIAGNOSIS — R928 Other abnormal and inconclusive findings on diagnostic imaging of breast: Secondary | ICD-10-CM

## 2023-09-14 ENCOUNTER — Ambulatory Visit
Admission: RE | Admit: 2023-09-14 | Discharge: 2023-09-14 | Disposition: A | Discharge status: Home / Self Care | Payer: 59 | Source: Ambulatory Visit | Attending: Nurse Practitioner | Admitting: Nurse Practitioner

## 2023-09-14 DIAGNOSIS — D0511 Intraductal carcinoma in situ of right breast: Secondary | ICD-10-CM | POA: Diagnosis not present

## 2023-09-14 DIAGNOSIS — R921 Mammographic calcification found on diagnostic imaging of breast: Secondary | ICD-10-CM

## 2023-09-14 HISTORY — PX: BREAST BIOPSY: SHX20

## 2023-09-15 LAB — SURGICAL PATHOLOGY

## 2023-09-18 ENCOUNTER — Ambulatory Visit: Payer: Self-pay | Admitting: Surgery

## 2023-09-18 DIAGNOSIS — D0511 Intraductal carcinoma in situ of right breast: Secondary | ICD-10-CM | POA: Diagnosis not present

## 2023-09-21 ENCOUNTER — Other Ambulatory Visit: Payer: Self-pay | Admitting: Surgery

## 2023-09-21 DIAGNOSIS — D0511 Intraductal carcinoma in situ of right breast: Secondary | ICD-10-CM

## 2023-09-28 NOTE — Pre-Procedure Instructions (Signed)
Surgical Instructions   Your procedure is scheduled on October 03, 2023. Report to [facility] Main Entrance "A" at 7:45 A.M., then check in with the Admitting office. Any questions or running late day of surgery: call [phone]  Questions prior to your surgery date: call [phone], Monday-Friday, 8am-4pm. If you experience any cold or flu symptoms such as cough, fever, chills, shortness of breath, etc. between now and your scheduled surgery, please notify us at the above number.     Remember:  Do not eat after midnight the night before your surgery   You may drink clear liquids until 6:45 AM the morning of your surgery.   Clear liquids allowed are: Water, Non-Citrus Juices (without pulp), Carbonated Beverages, Clear Tea, Black Coffee Only (NO MILK, CREAM OR POWDERED CREAMER of any kind), and Gatorade.    Take these medicines the morning of surgery with A SIP OF WATER: cycloSPORINE (RESTASIS) ophthalmic emulsion    One week prior to surgery, STOP taking any Aspirin (unless otherwise instructed by your surgeon) Aleve, Naproxen, Ibuprofen, Motrin, Advil, Goody's, BC's, all herbal medications, fish oil, and non-prescription vitamins.                     Do NOT Smoke (Tobacco/Vaping) for 24 hours prior to your procedure.  If you use a CPAP at night, you may bring your mask/headgear for your overnight stay.   You will be asked to remove any contacts, glasses, piercing's, hearing aid's, dentures/partials prior to surgery. Please bring cases for these items if needed.    Patients discharged the day of surgery will not be allowed to drive home, and someone needs to stay with them for 24 hours.  SURGICAL WAITING ROOM VISITATION Patients may have no more than 2 support people in the waiting area - these visitors may rotate.   Pre-op nurse will coordinate an appropriate time for 1 ADULT support person, who may not rotate, to accompany patient in pre-op.  Children under the age of 71  must have an adult with them who is not the patient and must remain in the main waiting area with an adult.  If the patient needs to stay at the hospital during part of their recovery, the visitor guidelines for inpatient rooms apply.  Please refer to the [facility] website for the visitor guidelines for any additional information.   If you received a COVID test during your pre-op visit  it is requested that you wear a mask when out in public, stay away from anyone that may not be feeling well and notify your surgeon if you develop symptoms. If you have been in contact with anyone that has tested positive in the last 10 days please notify you surgeon.      Pre-operative CHG Bathing Instructions   You can play a key role in reducing the risk of infection after surgery. Your skin needs to be as free of germs as possible. You can reduce the number of germs on your skin by washing with CHG (chlorhexidine gluconate) soap before surgery. CHG is an antiseptic soap that kills germs and continues to kill germs even after washing.   DO NOT use if you have an allergy to chlorhexidine/CHG or antibacterial soaps. If your skin becomes reddened or irritated, stop using the CHG and notify one of our RNs at [phone].              TAKE A SHOWER THE NIGHT BEFORE SURGERY AND THE DAY OF SURGERY  Please keep in mind the following:  DO NOT shave, including legs and underarms, 48 hours prior to surgery.   You may shave your face before/day of surgery.  Place clean sheets on your bed the night before surgery Use a clean washcloth (not used since being washed) for each shower. DO NOT sleep with pet's night before surgery.  CHG Shower Instructions:  Wash your face and private area with normal soap. If you choose to wash your hair, wash first with your normal shampoo.  After you use shampoo/soap, rinse your hair and body thoroughly to remove shampoo/soap residue.  Turn the water OFF and apply half the  bottle of CHG soap to a CLEAN washcloth.  Apply CHG soap ONLY FROM YOUR NECK DOWN TO YOUR TOES (washing for 3-5 minutes)  DO NOT use CHG soap on face, private areas, open wounds, or sores.  Pay special attention to the area where your surgery is being performed.  If you are having back surgery, having someone wash your back for you may be helpful. Wait 2 minutes after CHG soap is applied, then you may rinse off the CHG soap.  Pat dry with a clean towel  Put on clean pajamas    Additional instructions for the day of surgery: DO NOT APPLY any lotions, deodorants, cologne, or perfumes.   Do not wear jewelry or makeup Do not wear nail polish, gel polish, artificial nails, or any other type of covering on natural nails (fingers and toes) Do not bring valuables to the hospital. [facility] is not responsible for valuables/personal belongings. Put on clean/comfortable clothes.  Please brush your teeth.  Ask your nurse before applying any prescription medications to the skin.

## 2023-09-29 ENCOUNTER — Other Ambulatory Visit: Payer: Self-pay

## 2023-09-29 ENCOUNTER — Encounter (HOSPITAL_COMMUNITY)
Admission: RE | Admit: 2023-09-29 | Discharge: 2023-09-29 | Disposition: A | Discharge status: Home / Self Care | Payer: 59 | Source: Ambulatory Visit | Attending: Surgery | Admitting: Surgery

## 2023-09-29 ENCOUNTER — Encounter (HOSPITAL_COMMUNITY): Payer: Self-pay

## 2023-09-29 DIAGNOSIS — Z01812 Encounter for preprocedural laboratory examination: Secondary | ICD-10-CM | POA: Insufficient documentation

## 2023-09-29 DIAGNOSIS — Z01818 Encounter for other preprocedural examination: Secondary | ICD-10-CM

## 2023-09-29 HISTORY — DX: Gastro-esophageal reflux disease without esophagitis: K21.9

## 2023-09-29 LAB — CBC
HCT: 42 % (ref 36.0–46.0)
Hemoglobin: 13.4 g/dL (ref 12.0–15.0)
MCH: 28.8 pg (ref 26.0–34.0)
MCHC: 31.9 g/dL (ref 30.0–36.0)
MCV: 90.1 fL (ref 80.0–100.0)
Platelets: 226 10*3/uL (ref 150–400)
RBC: 4.66 MIL/uL (ref 3.87–5.11)
RDW: 11.9 % (ref 11.5–15.5)
WBC: 5.4 10*3/uL (ref 4.0–10.5)
nRBC: 0 % (ref 0.0–0.2)

## 2023-09-29 LAB — PREGNANCY, URINE: Preg Test, Ur: NEGATIVE

## 2023-09-29 NOTE — Progress Notes (Addendum)
PCP - Audria Nine, NP  Cardiologist - no  EP-no  Endocrine-no  Pulm-no  Chest x-ray - no  EKG - na  Stress Test - no  ECHO - no  Cardiac Cath - no  AICD-no PM-no LOOP-no  Nerve Stimulator-no  Dialysis-no  Sleep Study - no CPAP - no  LABS-CBC, Urine Preg  ASA-na  ERAS-yes  HA1C-na GLP-1-no Fasting Blood Sugar - na Checks Blood Sugar ___na__ times a day  PAT interview completed with Tresa Endo a v interpreter for [place] Anesthesia-  Pt denies having chest pain, sob, or fever at this time. All instructions explained to the pt, with a verbal understanding of the material. Pt agrees to go over the instructions while at home for a better understanding. The opportunity to ask questions was provided.

## 2023-10-02 ENCOUNTER — Ambulatory Visit
Admission: RE | Admit: 2023-10-02 | Discharge: 2023-10-02 | Disposition: A | Discharge status: Home / Self Care | Payer: 59 | Source: Ambulatory Visit | Attending: Surgery | Admitting: Surgery

## 2023-10-02 DIAGNOSIS — C50911 Malignant neoplasm of unspecified site of right female breast: Secondary | ICD-10-CM | POA: Diagnosis not present

## 2023-10-02 DIAGNOSIS — D0511 Intraductal carcinoma in situ of right breast: Secondary | ICD-10-CM

## 2023-10-02 HISTORY — PX: BREAST BIOPSY: SHX20

## 2023-10-03 ENCOUNTER — Encounter (HOSPITAL_COMMUNITY)
Admission: RE | Disposition: A | Discharge status: Home / Self Care | Payer: Self-pay | Source: Home / Self Care | Attending: Surgery

## 2023-10-03 ENCOUNTER — Ambulatory Visit (HOSPITAL_COMMUNITY)
Admission: RE | Admit: 2023-10-03 | Discharge: 2023-10-03 | Disposition: A | Discharge status: Home / Self Care | Payer: 59 | Attending: Surgery | Admitting: Surgery

## 2023-10-03 ENCOUNTER — Other Ambulatory Visit (HOSPITAL_COMMUNITY): Payer: Self-pay

## 2023-10-03 ENCOUNTER — Ambulatory Visit (HOSPITAL_BASED_OUTPATIENT_CLINIC_OR_DEPARTMENT_OTHER): Payer: 59 | Admitting: Anesthesiology

## 2023-10-03 ENCOUNTER — Other Ambulatory Visit: Payer: Self-pay

## 2023-10-03 ENCOUNTER — Ambulatory Visit
Admission: RE | Admit: 2023-10-03 | Discharge: 2023-10-03 | Disposition: A | Discharge status: Home / Self Care | Payer: 59 | Source: Ambulatory Visit | Attending: Surgery | Admitting: Surgery

## 2023-10-03 ENCOUNTER — Ambulatory Visit (HOSPITAL_COMMUNITY): Payer: 59 | Admitting: Physician Assistant

## 2023-10-03 DIAGNOSIS — D0511 Intraductal carcinoma in situ of right breast: Secondary | ICD-10-CM | POA: Diagnosis not present

## 2023-10-03 DIAGNOSIS — R920 Mammographic microcalcification found on diagnostic imaging of breast: Secondary | ICD-10-CM | POA: Diagnosis not present

## 2023-10-03 DIAGNOSIS — N62 Hypertrophy of breast: Secondary | ICD-10-CM | POA: Diagnosis not present

## 2023-10-03 DIAGNOSIS — Z170 Estrogen receptor positive status [ER+]: Secondary | ICD-10-CM | POA: Diagnosis not present

## 2023-10-03 DIAGNOSIS — Z1721 Progesterone receptor positive status: Secondary | ICD-10-CM | POA: Insufficient documentation

## 2023-10-03 DIAGNOSIS — N641 Fat necrosis of breast: Secondary | ICD-10-CM | POA: Diagnosis not present

## 2023-10-03 DIAGNOSIS — C50911 Malignant neoplasm of unspecified site of right female breast: Secondary | ICD-10-CM | POA: Diagnosis not present

## 2023-10-03 DIAGNOSIS — N6021 Fibroadenosis of right breast: Secondary | ICD-10-CM | POA: Diagnosis not present

## 2023-10-03 DIAGNOSIS — N6031 Fibrosclerosis of right breast: Secondary | ICD-10-CM | POA: Diagnosis not present

## 2023-10-03 DIAGNOSIS — N6001 Solitary cyst of right breast: Secondary | ICD-10-CM | POA: Diagnosis not present

## 2023-10-03 DIAGNOSIS — Z01818 Encounter for other preprocedural examination: Secondary | ICD-10-CM

## 2023-10-03 HISTORY — PX: BREAST LUMPECTOMY WITH RADIOACTIVE SEED LOCALIZATION: SHX6424

## 2023-10-03 SURGERY — BREAST LUMPECTOMY WITH RADIOACTIVE SEED LOCALIZATION
Anesthesia: General | Site: Breast | Laterality: Right

## 2023-10-03 MED ORDER — LIDOCAINE 2% (20 MG/ML) 5 ML SYRINGE
INTRAMUSCULAR | Status: DC | PRN
  Administered 2023-10-03: 40 mg via INTRAVENOUS

## 2023-10-03 MED ORDER — MIDAZOLAM HCL 2 MG/2ML IJ SOLN
INTRAMUSCULAR | Status: DC | PRN
  Administered 2023-10-03: 2 mg via INTRAVENOUS

## 2023-10-03 MED ORDER — ORAL CARE MOUTH RINSE: 15.0000 mL | Freq: Once | OROMUCOSAL | Status: AC

## 2023-10-03 MED ORDER — LACTATED RINGERS IV SOLN: INTRAVENOUS | Status: DC | PRN

## 2023-10-03 MED ORDER — FENTANYL CITRATE (PF) 250 MCG/5ML IJ SOLN
INTRAMUSCULAR | Status: AC
  Filled 2023-10-03: qty 5

## 2023-10-03 MED ORDER — CHLORHEXIDINE GLUCONATE CLOTH 2 % EX PADS: 6.0000 | MEDICATED_PAD | Freq: Once | CUTANEOUS | Status: DC

## 2023-10-03 MED ORDER — MIDAZOLAM HCL 2 MG/2ML IJ SOLN
INTRAMUSCULAR | Status: AC
  Filled 2023-10-03: qty 2

## 2023-10-03 MED ORDER — ACETAMINOPHEN 500 MG PO TABS: 1000.0000 mg | ORAL_TABLET | Freq: Once | ORAL | Status: DC | PRN

## 2023-10-03 MED ORDER — BUPIVACAINE-EPINEPHRINE 0.25% -1:200000 IJ SOLN
INTRAMUSCULAR | Status: DC | PRN
  Administered 2023-10-03: 30 mL

## 2023-10-03 MED ORDER — ONDANSETRON HCL 4 MG/2ML IJ SOLN
INTRAMUSCULAR | Status: DC | PRN
  Administered 2023-10-03: 4 mg via INTRAVENOUS

## 2023-10-03 MED ORDER — FENTANYL CITRATE (PF) 100 MCG/2ML IJ SOLN
INTRAMUSCULAR | Status: AC
  Filled 2023-10-03: qty 2

## 2023-10-03 MED ORDER — 0.9 % SODIUM CHLORIDE (POUR BTL) OPTIME
TOPICAL | Status: DC | PRN
  Administered 2023-10-03: 1000 mL

## 2023-10-03 MED ORDER — OXYCODONE HCL 5 MG PO TABS
5.0000 mg | ORAL_TABLET | Freq: Four times a day (QID) | ORAL | 0 refills | Status: DC | PRN
  Filled 2023-10-03: qty 15, 4d supply, fill #0

## 2023-10-03 MED ORDER — FENTANYL CITRATE (PF) 100 MCG/2ML IJ SOLN
25.0000 ug | INTRAMUSCULAR | Status: DC | PRN
  Administered 2023-10-03: 25 ug via INTRAVENOUS

## 2023-10-03 MED ORDER — OXYCODONE HCL 5 MG PO TABS
ORAL_TABLET | ORAL | Status: AC
  Filled 2023-10-03: qty 1

## 2023-10-03 MED ORDER — ACETAMINOPHEN 160 MG/5ML PO SOLN: 1000.0000 mg | Freq: Once | ORAL | Status: DC | PRN

## 2023-10-03 MED ORDER — ACETAMINOPHEN 10 MG/ML IV SOLN
1000.0000 mg | Freq: Once | INTRAVENOUS | Status: DC | PRN
Start: 2023-10-03 — End: 2023-10-03

## 2023-10-03 MED ORDER — BUPIVACAINE-EPINEPHRINE (PF) 0.25% -1:200000 IJ SOLN
INTRAMUSCULAR | Status: AC
  Filled 2023-10-03: qty 30

## 2023-10-03 MED ORDER — CHLORHEXIDINE GLUCONATE 0.12 % MT SOLN: 15.0000 mL | Freq: Once | OROMUCOSAL | Status: AC

## 2023-10-03 MED ORDER — OXYCODONE HCL 5 MG/5ML PO SOLN
5.0000 mg | Freq: Once | ORAL | Status: AC | PRN
Start: 2023-10-03 — End: 2023-10-03

## 2023-10-03 MED ORDER — PROPOFOL 10 MG/ML IV BOLUS
INTRAVENOUS | Status: AC
  Filled 2023-10-03: qty 20

## 2023-10-03 MED ORDER — PROPOFOL 10 MG/ML IV BOLUS
INTRAVENOUS | Status: DC | PRN
  Administered 2023-10-03: 40 mg via INTRAVENOUS
  Administered 2023-10-03: 130 mg via INTRAVENOUS
  Administered 2023-10-03: 30 mg via INTRAVENOUS

## 2023-10-03 MED ORDER — CHLORHEXIDINE GLUCONATE 0.12 % MT SOLN
OROMUCOSAL | Status: AC
  Administered 2023-10-03: 15 mL via OROMUCOSAL
  Filled 2023-10-03: qty 15

## 2023-10-03 MED ORDER — CEFAZOLIN SODIUM-DEXTROSE 2-4 GM/100ML-% IV SOLN
2.0000 g | INTRAVENOUS | Status: AC
Start: 2023-10-03 — End: 2023-10-03
  Administered 2023-10-03: 2 g via INTRAVENOUS
  Filled 2023-10-03: qty 100

## 2023-10-03 MED ORDER — PROPOFOL 500 MG/50ML IV EMUL
INTRAVENOUS | Status: DC | PRN
  Administered 2023-10-03: 80 ug/kg/min via INTRAVENOUS

## 2023-10-03 MED ORDER — DEXAMETHASONE SODIUM PHOSPHATE 10 MG/ML IJ SOLN
INTRAMUSCULAR | Status: DC | PRN
  Administered 2023-10-03: 10 mg via INTRAVENOUS

## 2023-10-03 MED ORDER — FENTANYL CITRATE (PF) 250 MCG/5ML IJ SOLN
INTRAMUSCULAR | Status: DC | PRN
  Administered 2023-10-03 (×4): 50 ug via INTRAVENOUS

## 2023-10-03 MED ORDER — OXYCODONE HCL 5 MG PO TABS
5.0000 mg | ORAL_TABLET | Freq: Once | ORAL | Status: AC | PRN
  Administered 2023-10-03: 5 mg via ORAL

## 2023-10-03 SURGICAL SUPPLY — 41 items
ADH SKN CLS APL DERMABOND .7 (GAUZE/BANDAGES/DRESSINGS) ×1
APL PRP STRL LF DISP 70% ISPRP (MISCELLANEOUS) ×1
APPLIER CLIP 9.375 MED OPEN (MISCELLANEOUS)
APR CLP MED 9.3 20 MLT OPN (MISCELLANEOUS)
BAG COUNTER SPONGE SURGICOUNT (BAG) ×1 IMPLANT
BAG SPNG CNTER NS LX DISP (BAG) ×1
BINDER BREAST LRG (GAUZE/BANDAGES/DRESSINGS) IMPLANT
BINDER BREAST XLRG (GAUZE/BANDAGES/DRESSINGS) IMPLANT
CANISTER SUCT 3000ML PPV (MISCELLANEOUS) IMPLANT
CHLORAPREP W/TINT 26 (MISCELLANEOUS) ×1 IMPLANT
CLIP APPLIE 9.375 MED OPEN (MISCELLANEOUS) IMPLANT
COVER PROBE W GEL 5X96 (DRAPES) ×1 IMPLANT
COVER SURGICAL LIGHT HANDLE (MISCELLANEOUS) ×1 IMPLANT
DERMABOND ADVANCED .7 DNX12 (GAUZE/BANDAGES/DRESSINGS) ×1 IMPLANT
DEVICE DUBIN SPECIMEN MAMMOGRA (MISCELLANEOUS) ×1 IMPLANT
DRAPE CHEST BREAST 15X10 FENES (DRAPES) ×1 IMPLANT
ELECT CAUTERY BLADE 6.4 (BLADE) ×1 IMPLANT
ELECT REM PT RETURN 9FT ADLT (ELECTROSURGICAL) ×1
ELECTRODE REM PT RTRN 9FT ADLT (ELECTROSURGICAL) ×1 IMPLANT
GAUZE PAD ABD 8X10 STRL (GAUZE/BANDAGES/DRESSINGS) ×1 IMPLANT
GAUZE SPONGE 4X4 12PLY STRL (GAUZE/BANDAGES/DRESSINGS) IMPLANT
GLOVE BIO SURGEON STRL SZ8 (GLOVE) ×1 IMPLANT
GLOVE BIOGEL PI IND STRL 8 (GLOVE) ×1 IMPLANT
GOWN STRL REUS W/ TWL LRG LVL3 (GOWN DISPOSABLE) ×1 IMPLANT
GOWN STRL REUS W/ TWL XL LVL3 (GOWN DISPOSABLE) ×1 IMPLANT
GOWN STRL REUS W/TWL LRG LVL3 (GOWN DISPOSABLE) ×1
GOWN STRL REUS W/TWL XL LVL3 (GOWN DISPOSABLE) ×1
KIT BASIN OR (CUSTOM PROCEDURE TRAY) ×1 IMPLANT
KIT MARKER MARGIN INK (KITS) ×1 IMPLANT
LIGHT WAVEGUIDE WIDE FLAT (MISCELLANEOUS) IMPLANT
NDL HYPO 25GX1X1/2 BEV (NEEDLE) ×1 IMPLANT
NEEDLE HYPO 25GX1X1/2 BEV (NEEDLE) ×1
NS IRRIG 1000ML POUR BTL (IV SOLUTION) IMPLANT
PACK GENERAL/GYN (CUSTOM PROCEDURE TRAY) ×1 IMPLANT
SUT MNCRL AB 4-0 PS2 18 (SUTURE) ×1 IMPLANT
SUT SILK 2 0 SH (SUTURE) IMPLANT
SUT VIC AB 2-0 SH 27 (SUTURE)
SUT VIC AB 2-0 SH 27XBRD (SUTURE) IMPLANT
SUT VIC AB 3-0 SH 8-18 (SUTURE) ×1 IMPLANT
SYR CONTROL 10ML LL (SYRINGE) ×1 IMPLANT
TOWEL GREEN STERILE FF (TOWEL DISPOSABLE) IMPLANT

## 2023-10-03 NOTE — Op Note (Signed)
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  She was seen in the multidisciplinary clinic and opted for breast conserving surgery after reviewing all of her treatment options.The procedure has been discussed with the patient. Alternatives to surgery have been discussed with the patient.  Risks of surgery include bleeding,  Infection,  Seroma formation, death,  and the need for further surgery.   The patient understands and wishes to proceed.    Description of procedure: The patient was met in the holding area.  A vitamin E translator was available for communication purposes between the patient as well as her family.  All questions were answered.  Right breast was marked as correct site.  A seed was placed as an outpatient on the right and films were available for review.  She was taken back to the operating room.  She was placed supine upon the operating room table.  After induction of general anesthesia, right breast was prepped and draped in sterile fashion and timeout performed.  Neoprobe used identify the seed in the right breast upper outer quadrant.  Curvilinear incision was made over the signal.  Dissection was carried down all tissue and the seed and clip were excised with a grossly negative margin.  The tissue was oriented with ink and imaging revealed the seed and clip to be in the specimen.  The cavity is irrigated.  Clips were placed.  Local anesthetic infiltrated.  Once hemostasis was achieved, the cavities closed with a deep layer 3-0 Vicryl for Monocryl.  Dermabond applied.  All counts  found to be correct.  Patient was awoke extubated taken to recovery in satisfactory condition.

## 2023-10-03 NOTE — Discharge Instructions (Signed)

## 2023-10-03 NOTE — Transfer of Care (Signed)
Immediate Anesthesia Transfer of Care Note  Patient: Sierra Hensley  Procedure(s) Performed: RIGHT BREAST SEED LUMPECTOMY (Right: Breast)  Patient Location: PACU  Anesthesia Type:General  Level of Consciousness: alert  and patient cooperative  Airway & Oxygen Therapy: Patient Spontanous Breathing  Post-op Assessment: Report given to RN  Post vital signs: stable  Last Vitals:  Vitals Value Taken Time  BP 125/76 10/03/23 1115  Temp    Pulse 64 10/03/23 1115  Resp 14 10/03/23 1115  SpO2 100 % 10/03/23 1115  Vitals shown include unfiled device data.  Last Pain:  Vitals:   10/03/23 0825  TempSrc:   PainSc: 0-No pain         Complications: No notable events documented.

## 2023-10-03 NOTE — Anesthesia Postprocedure Evaluation (Signed)
Anesthesia Post Note  Patient: Sierra Hensley  Procedure(s) Performed: RIGHT BREAST SEED LUMPECTOMY (Right: Breast)     Patient location during evaluation: PACU Anesthesia Type: General Level of consciousness: awake and alert Pain management: pain level controlled Vital Signs Assessment: post-procedure vital signs reviewed and stable Respiratory status: spontaneous breathing, nonlabored ventilation and respiratory function stable Cardiovascular status: blood pressure returned to baseline and stable Postop Assessment: no apparent nausea or vomiting Anesthetic complications: no   No notable events documented.  Last Vitals:  Vitals:   10/03/23 1145 10/03/23 1155  BP: 114/79 123/81  Pulse: (!) 57 62  Resp: 16 17  Temp:  36.7 C  SpO2: 100% 100%    Last Pain:  Vitals:   10/03/23 1155  TempSrc:   PainSc: 4                  Galit Urich

## 2023-10-03 NOTE — Addendum Note (Signed)
Addendum  created 10/03/23 1534 by Val Eagle, MD   Attestation recorded in Intraprocedure, Intraprocedure Attestations filed

## 2023-10-03 NOTE — Progress Notes (Signed)
Interpretor at bedside to assist with recovery and post op instructions for both patient and spouse.

## 2023-10-03 NOTE — Anesthesia Preprocedure Evaluation (Signed)
Anesthesia Evaluation  Patient identified by MRN, date of birth, ID band Patient awake    Reviewed: Allergy & Precautions, NPO status , Patient's Chart, lab work & pertinent test results  History of Anesthesia Complications Negative for: history of anesthetic complications  Airway Mallampati: III  TM Distance: <3 FB Neck ROM: Full    Dental  (+) Teeth Intact, Dental Advisory Given   Pulmonary neg pulmonary ROS   breath sounds clear to auscultation       Cardiovascular negative cardio ROS  Rhythm:Regular     Neuro/Psych negative neurological ROS  negative psych ROS   GI/Hepatic negative GI ROS, Neg liver ROS,GERD  Controlled,,  Endo/Other  negative endocrine ROS    Renal/GU negative Renal ROS     Musculoskeletal negative musculoskeletal ROS (+)    Abdominal   Peds  Hematology negative hematology ROS (+)   Anesthesia Other Findings   Reproductive/Obstetrics                             Anesthesia Physical Anesthesia Plan  ASA: 1  Anesthesia Plan: General   Post-op Pain Management: Toradol IV (intra-op)*   Induction: Intravenous  PONV Risk Score and Plan: 3 and Ondansetron, Propofol infusion, Dexamethasone and TIVA  Airway Management Planned: LMA  Additional Equipment: None  Intra-op Plan:   Post-operative Plan: Extubation in OR  Informed Consent: I have reviewed the patients History and Physical, chart, labs and discussed the procedure including the risks, benefits and alternatives for the proposed anesthesia with the patient or authorized representative who has indicated his/her understanding and acceptance.     Dental advisory given  Plan Discussed with: CRNA  Anesthesia Plan Comments:        Anesthesia Quick Evaluation

## 2023-10-03 NOTE — H&P (Signed)
History of Present Illness: Jevon Littlepage is a 52 y.o. female who is seen today as an office consultation for evaluation of Right Breast Cancer  Patient presents for evaluation of right breast mammographic abnormality. She had a cluster of microcalcifications on routine mammogram upper outer quadrant which were pleomorphic. 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 A [place] phone translator was used for communication purposes. The patient denies any problems prior to that.   Review of Systems: A complete review of systems was obtained from the patient. I have reviewed this information and discussed as appropriate with the patient. See HPI as well for other ROS.    Medical History: Past Medical History:  Diagnosis Date  GERD (gastroesophageal reflux disease)   There is no problem list on file for this patient.  Past Surgical History:  Procedure Laterality Date  CESAREAN SECTION MULTI-GESTATIONAL 12/09/2014  Dr. Jackelyn Knife  APPENDECTOMY LAPAROSCOPIC ERAS PATHWAY 10/03/2018  Dr. Sheliah Hatch    No Known Allergies  No current outpatient medications on file prior to visit.   No current facility-administered medications on file prior to visit.   Family History  Family history unknown: Yes    Social History   Tobacco Use  Smoking Status Never  Smokeless Tobacco Never    Social History   Socioeconomic History  Marital status: Divorced  Tobacco Use  Smoking status: Never  Smokeless tobacco: Never  Substance and Sexual Activity  Alcohol use: Never  Drug use: Never   Social Drivers of Health   Food Insecurity: No Food Insecurity (02/16/2021)  Received from [facility]  Hunger Vital Sign  Worried About Running Out of Food in the Last Year: Never true  Ran Out of Food in the Last Year: Never true  Received from [employer]  Social Network   Objective:   Vitals:  09/18/23 1012  BP: 110/70  Pulse: 89  Temp: 36.9 C (98.4 F)  SpO2: 99%  Weight: 69.2  kg (152 lb 9.6 oz)  Height: 162.6 cm (5\' 4" )  PainSc: 0-No pain   Body mass index is 26.19 kg/m.  Physical Exam Exam conducted with a chaperone present.  Cardiovascular:  Rate and Rhythm: Normal rate.  Pulmonary:  Effort: Pulmonary effort is normal.  Chest:  Breasts: Right: Normal. No mass or nipple discharge.  Left: Normal. No mass or nipple discharge.  Musculoskeletal:  General: Normal range of motion.  Cervical back: Normal range of motion.  Lymphadenopathy:  Upper Body:  Right upper body: No supraclavicular or axillary adenopathy.  Left upper body: No supraclavicular or axillary adenopathy.  Skin: General: Skin is warm.  Neurological:  General: No focal deficit present.  Mental Status: She is alert.  Psychiatric:  Mood and Affect: Mood normal.     Labs, Imaging and Diagnostic Testing:  CLINICAL DATA: Recall from screening mammography, calcifications involving the outer RIGHT breast at posterior depth.  EXAM: DIGITAL DIAGNOSTIC UNILATERAL RIGHT MAMMOGRAM  TECHNIQUE: Right digital diagnostic mammography was performed.  COMPARISON: Previous exam(s).  ACR Breast Density Category c: The breasts are heterogeneously dense, which may obscure small masses.  FINDINGS: Spot magnification CC and mediolateral views of the calcifications and a full field mediolateral view were obtained.  There is a group of fine heterogeneous calcifications in the outer breast at posterior depth, projecting directly behind the nipple on the mediolateral view. The calcifications span approximately 1.4 x 1.3 x 1.0 cm.  IMPRESSION: Indeterminate 1.4 cm group of fine heterogeneous calcifications in the outer RIGHT breast at posterior depth.  RECOMMENDATION: Stereotactic tomosynthesis core needle biopsy of the indeterminate calcifications.  I have discussed the findings and recommendations with the patient. Communication with the patient was achieved with the assistance of  a certified interpreter using the [facility] device. The stereotactic tomosynthesis core needle biopsy procedure was discussed with the patient and her questions were answered. She wishes to proceed with the biopsy which has been scheduled for tomorrow, 09/14/2023, at 10:30 a.m.  BI-RADS CATEGORY 4: Suspicious. FINAL DIAGNOSIS  1. 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 Case is reviewed in consultation by Dr. Kenyon Ana who concurs with the interpretation. Diagnosis called to Orlie Pollen at [facility] by Dr. Venetia Night on 09/15/2023 at 9:44 AM.  DATE SIGNED OUT: 09/15/2023 ELECTRONIC SIGNATURE : Picklesimer Md, Fred , Sports administrator, Electronic Signature  MICROSCOPIC DESCRIPTION  CASE COMMENTS STAINS USED IN DIAGNOSIS: H&E-2 H&E-3 H&E-4 H&E ER-ACIS PR-ACIS *RECUT 1 SLIDE   Assessment and Plan:   Diagnoses and all orders for this visit:  Ductal carcinoma in situ (DCIS) of right breast   Discussed treatment options. Discussed breast conserving surgery with mastectomy with reconstruction. The patient is opted for right breast seed localized lumpectomy. Refer to medical and radiation oncology.  The procedure has been discussed with the patient. Alternatives to surgery have been discussed with the patient. Risks of surgery include bleeding, Infection, Seroma formation, death, and the need for further surgery. The patient understands and wishes to proceed.  [place] Nurse, learning disability used for communication purposes. All questions were answered.  Hayden Rasmussen, MD

## 2023-10-03 NOTE — Interval H&P Note (Signed)
History and Physical Interval Note:  10/03/2023 9:26 AM  Sierra Hensley  has presented today for surgery, with the diagnosis of RIGHT BREAST DUCTAL CARCINOMA IN SITU.  The various methods of treatment have been discussed with the patient and family. After consideration of risks, benefits and other options for treatment, the patient has consented to  Procedure(s): RIGHT BREAST SEED LUMPECTOMY (Right) as a surgical intervention.  The patient's history has been reviewed, patient examined, no change in status, stable for surgery.  I have reviewed the patient's chart and labs.  Questions were answered to the patient's satisfaction.    The procedure has been discussed with the patient. Alternatives to surgery have been discussed with the patient.  Risks of surgery include bleeding,  Infection,  Seroma formation, death,  and the need for further surgery.   The patient understands and wishes to proceed.  Janel Beane A Noriah Osgood

## 2023-10-04 ENCOUNTER — Encounter (HOSPITAL_COMMUNITY): Payer: Self-pay | Admitting: Surgery

## 2023-10-05 LAB — SURGICAL PATHOLOGY

## 2023-10-30 ENCOUNTER — Other Ambulatory Visit: Payer: Self-pay | Admitting: *Deleted

## 2023-10-30 DIAGNOSIS — D0511 Intraductal carcinoma in situ of right breast: Secondary | ICD-10-CM | POA: Insufficient documentation

## 2023-10-31 ENCOUNTER — Telehealth: Payer: Self-pay | Admitting: Radiation Oncology

## 2023-10-31 NOTE — Progress Notes (Signed)
New Breast Cancer Diagnosis: Right Breast   Histology per Pathology Report: Intermediate to High grade, DCIS  Receptor Status: ER(positive), PR (positive), Her2-neu (), Ki-(%)   Surgeon and surgical plan, if any:  Dr. Luisa Hart -Right Breast seed Lumpectomy 10/03/2023   Medical oncologist, treatment if any:    Family History of Breast/Ovarian/Prostate Cancer:   Lymphedema issues, if any:      Pain issues, if any:     SAFETY ISSUES: Prior radiation?  Pacemaker/ICD?  Possible current pregnancy? Is the patient on methotrexate?   Current Complaints / other details:

## 2023-10-31 NOTE — Telephone Encounter (Signed)
Called patient to schedule a consultation w. Dr. Mitzi Hansen. No answer, LVM via interpreter for a return call.

## 2023-11-02 ENCOUNTER — Ambulatory Visit
Admission: RE | Admit: 2023-11-02 | Discharge: 2023-11-02 | Disposition: A | Discharge status: Home / Self Care | Payer: 59 | Source: Ambulatory Visit | Attending: Radiation Oncology | Admitting: Radiation Oncology

## 2023-11-02 ENCOUNTER — Encounter: Payer: Self-pay | Admitting: Radiation Oncology

## 2023-11-02 VITALS — BP 108/77 | HR 73 | Temp 97.5°F | Resp 18 | Ht 64.0 in | Wt 148.1 lb

## 2023-11-02 DIAGNOSIS — K219 Gastro-esophageal reflux disease without esophagitis: Secondary | ICD-10-CM | POA: Diagnosis not present

## 2023-11-02 DIAGNOSIS — D0511 Intraductal carcinoma in situ of right breast: Secondary | ICD-10-CM | POA: Diagnosis not present

## 2023-11-02 DIAGNOSIS — Z79899 Other long term (current) drug therapy: Secondary | ICD-10-CM | POA: Insufficient documentation

## 2023-11-02 DIAGNOSIS — Z170 Estrogen receptor positive status [ER+]: Secondary | ICD-10-CM | POA: Diagnosis not present

## 2023-11-02 NOTE — Progress Notes (Signed)
Radiation Oncology         [phone] ________________________________  Name: Sierra Hensley MRN: [id number]  Date: 11/02/2023  DOB: Nov 03, 1971  CC:Gerre Scull, NP  Harriette Bouillon, MD     REFERRING PHYSICIAN: Harriette Bouillon, MD   DIAGNOSIS: The encounter diagnosis was Ductal carcinoma in situ of right breast.   HISTORY OF PRESENT ILLNESS::Sierra Hensley is a 52 y.o. female who is seen for an initial consultation visit regarding the patient's diagnosis of right-sided breast cancer.  The patient was found to have suspicious findings within the right breast and ultimately proceeded with a right sided lumpectomy.  This was completed on 10/03/2023.  The patient's final pathology revealed a grade 3 ductal carcinoma in situ measuring 2.0 cm.  The margin was positive with respect to the anterior aspect.  This was noted to be skin and did not require reexcision independent of surgery.  The closest margin otherwise was at 1.5 mm posteriorly.  The patient initially was reluctant to consider adjuvant radiation treatment but has reconsidered this and wished to speak with [place] today about this.  The patient notes that she is going back to [place] for 6 weeks beginning on 11/29/2022 and is interested in potentially trying to get treatment completed before this.    PREVIOUS RADIATION THERAPY: No   PAST MEDICAL HISTORY:  has a past medical history of Acute appendicitis with perforation and peritoneal abscess (08/09/2018), Allergy, Dichorionic diamniotic twin gestation (11/29/2014), GERD (gastroesophageal reflux disease), and Small bowel obstruction (HCC) (10/13/2018).     PAST SURGICAL HISTORY: Past Surgical History:  Procedure Laterality Date   BREAST BIOPSY Right 09/14/2023   MM RT BREAST BX W LOC DEV 1ST LESION IMAGE BX SPEC STEREO GUIDE 09/14/2023 [facility]   BREAST BIOPSY  10/02/2023   MM RT RADIOACTIVE SEED LOC MAMMO GUIDE 10/02/2023 [facility]   BREAST LUMPECTOMY WITH RADIOACTIVE  SEED LOCALIZATION Right 10/03/2023   Procedure: RIGHT BREAST SEED LUMPECTOMY;  Surgeon: Harriette Bouillon, MD;  Location: [facility] OR;  Service: General;  Laterality: Right;   CESAREAN SECTION MULTI-GESTATIONAL N/A 12/09/2014   Procedure: CESAREAN SECTION MULTI-GESTATIONAL;  Surgeon: Lavina Hamman, MD;  Location: [facility]S;  Service: Obstetrics;  Laterality: N/A;   HERNIA REPAIR     LAPAROSCOPIC APPENDECTOMY N/A 10/03/2018   Procedure: APPENDECTOMY LAPAROSCOPIC ERAS PATHWAY;  Surgeon: Kinsinger, De Blanch, MD;  Location: [facility] OR;  Service: General;  Laterality: N/A;   LASIK Bilateral      FAMILY HISTORY: Family history is unknown by patient.   SOCIAL HISTORY:  reports that she has never smoked. She has never used smokeless tobacco. She reports that she does not drink alcohol and does not use drugs.   ALLERGIES: Patient has no known allergies.   MEDICATIONS:  Current Outpatient Medications  Medication Sig Dispense Refill   cetirizine (ZYRTEC) 10 MG tablet Take 1 tablet by mouth once daily (Patient not taking: Reported on 09/27/2023) 180 tablet 0   cycloSPORINE (RESTASIS) 0.05 % ophthalmic emulsion Place 1 drop into both eyes 2 (two) times daily.     fluticasone (FLONASE) 50 MCG/ACT nasal spray Place 2 sprays into both nostrils daily. (Patient not taking: Reported on 09/27/2023) 16 g 0   guaiFENesin (MUCINEX) 600 MG 12 hr tablet Take 1 tablet (600 mg total) by mouth 2 (two) times daily as needed for cough or to loosen phlegm. (Patient not taking: Reported on 07/03/2023) 60 tablet 0   ipratropium (ATROVENT) 0.06 % nasal spray USE 2 SPRAY(S) IN [facility]  NOSTRIL 4 TIMES DAILY AS NEEDED FOR NASAL CONGESTION OR RUNNY NOSE (Patient not taking: Reported on 09/27/2023) 15 mL 1   montelukast (SINGULAIR) 10 MG tablet Take 1 tablet (10 mg total) by mouth at bedtime. (Patient not taking: Reported on 09/27/2023) 30 tablet 3   omeprazole (PRILOSEC) 20 MG capsule Take 1 capsule (20 mg total) by mouth daily. (Patient not  taking: Reported on 09/27/2023) 90 capsule 3   oxyCODONE (OXY IR/ROXICODONE) 5 MG immediate release tablet Take 1 tablet (5 mg total) by mouth every 6 (six) hours as needed for severe pain (pain score 7-10). 15 tablet 0   Vitamin D, Ergocalciferol, (DRISDOL) 1.25 MG (50000 UNIT) CAPS capsule Take 1 capsule (50,000 Units total) by mouth every 7 (seven) days. (Patient not taking: Reported on 09/27/2023) 12 capsule 1   No current facility-administered medications for this encounter.     REVIEW OF SYSTEMS:  A 15 point review of systems is documented in the electronic medical record. This was obtained by the nursing staff. However, I reviewed this with the patient to discuss relevant findings and make appropriate changes.  Pertinent items are noted in HPI.    PHYSICAL EXAM:  height is 5\' 4"  (1.626 m) and weight is 148 lb 2 oz (67.2 kg). Her temporal temperature is 97.5 F (36.4 C) (abnormal). Her blood pressure is 108/77 and her pulse is 73. Her respiration is 18 and oxygen saturation is 100%.   ECOG = 0  0 - Asymptomatic (Fully active, able to carry on all predisease activities without restriction)  1 - Symptomatic but completely ambulatory (Restricted in physically strenuous activity but ambulatory and able to carry out work of a light or sedentary nature. For example, light housework, office work)  2 - Symptomatic, <50% in bed during the day (Ambulatory and capable of all self care but unable to carry out any work activities. Up and about more than 50% of waking hours)  3 - Symptomatic, >50% in bed, but not bedbound (Capable of only limited self-care, confined to bed or chair 50% or more of waking hours)  4 - Bedbound (Completely disabled. Cannot carry on any self-care. Totally confined to bed or chair)  5 - Death   Santiago Glad MM, Creech RH, Tormey DC, et al. [phone]). "Toxicity and response criteria of the [facility]". Am. Evlyn Clines. Oncol. 5 (6): 649-55  Alert, no acute  distress   LABORATORY DATA:  Lab Results  Component Value Date   WBC 5.4 09/29/2023   HGB 13.4 09/29/2023   HCT 42.0 09/29/2023   MCV 90.1 09/29/2023   PLT 226 09/29/2023   Lab Results  Component Value Date   NA 140 07/25/2023   K 4.2 07/25/2023   CL 101 07/25/2023   CO2 31 07/25/2023   Lab Results  Component Value Date   ALT 28 07/25/2023   AST 20 07/25/2023   ALKPHOS 62 07/25/2023   BILITOT 0.6 07/25/2023      RADIOGRAPHY: No results found.     IMPRESSION/ PLAN:  The patient is status post lumpectomy for a grade 3 ductal carcinoma in situ.  I discussed a recommended course of adjuvant radiation treatment to the right breast to reduce the risk of local recurrence.  We discussed this in some detail with the help of a translator today.  We discussed the logistics and possible side effects of treatment as well.  After this discussion, the patient indicates that she wishes to proceed with treatment.  She  leaves the country on 11[place]30[place]2023 so we discussed trying to complete a course of adjuvant radiation treatment before then.  I believe we will be able to potentially start next Monday which would allow [place] to complete a 16 fraction course of treatment to the right breast, and we will omit a boost.  I believe this would be more preferable than trying to begin radiation treatment after she returns as she is going to be out of the country for an extended period of time beginning in early January.  She wishes to proceed with this treatment and will proceed with simulation tomorrow for treatment planning.  The patient was seen in person today in clinic.  The total time spent on the patient's visit today was 45 minutes, including chart review, direct discussion/evaluation with the patient, and coordination of care.        ________________________________   Radene Gunning, MD, PhD   **Disclaimer: This note was dictated with voice recognition software. Similar sounding words can  inadvertently be transcribed and this note may contain transcription errors which may not have been corrected upon publication of note.**

## 2023-11-03 ENCOUNTER — Ambulatory Visit
Admission: RE | Admit: 2023-11-03 | Discharge: 2023-11-03 | Disposition: A | Discharge status: Home / Self Care | Payer: 59 | Source: Ambulatory Visit | Attending: Radiation Oncology | Admitting: Radiation Oncology

## 2023-11-03 DIAGNOSIS — Z170 Estrogen receptor positive status [ER+]: Secondary | ICD-10-CM | POA: Diagnosis not present

## 2023-11-03 DIAGNOSIS — Z510 Encounter for antineoplastic radiation therapy: Secondary | ICD-10-CM | POA: Insufficient documentation

## 2023-11-03 DIAGNOSIS — D0511 Intraductal carcinoma in situ of right breast: Secondary | ICD-10-CM | POA: Insufficient documentation

## 2023-11-03 DIAGNOSIS — Z1721 Progesterone receptor positive status: Secondary | ICD-10-CM | POA: Insufficient documentation

## 2023-11-06 ENCOUNTER — Other Ambulatory Visit: Payer: Self-pay

## 2023-11-06 ENCOUNTER — Ambulatory Visit
Admission: RE | Admit: 2023-11-06 | Discharge: 2023-11-06 | Disposition: A | Discharge status: Home / Self Care | Payer: 59 | Source: Ambulatory Visit | Attending: Radiation Oncology | Admitting: Radiation Oncology

## 2023-11-06 DIAGNOSIS — Z170 Estrogen receptor positive status [ER+]: Secondary | ICD-10-CM | POA: Diagnosis not present

## 2023-11-06 DIAGNOSIS — D0511 Intraductal carcinoma in situ of right breast: Secondary | ICD-10-CM | POA: Diagnosis not present

## 2023-11-06 DIAGNOSIS — Z510 Encounter for antineoplastic radiation therapy: Secondary | ICD-10-CM | POA: Diagnosis not present

## 2023-11-06 LAB — RAD ONC ARIA SESSION SUMMARY
Course Elapsed Days: 0
Plan Fractions Treated to Date: 1
Plan Prescribed Dose Per Fraction: 2.66 Gy
Plan Total Fractions Prescribed: 16
Plan Total Prescribed Dose: 42.56 Gy
Reference Point Dosage Given to Date: 2.66 Gy
Reference Point Session Dosage Given: 2.66 Gy
Session Number: 1

## 2023-11-07 ENCOUNTER — Other Ambulatory Visit: Payer: Self-pay

## 2023-11-07 ENCOUNTER — Ambulatory Visit
Admission: RE | Admit: 2023-11-07 | Discharge: 2023-11-07 | Disposition: A | Discharge status: Home / Self Care | Payer: 59 | Source: Ambulatory Visit | Attending: Radiation Oncology

## 2023-11-07 ENCOUNTER — Encounter: Payer: Self-pay | Admitting: *Deleted

## 2023-11-07 DIAGNOSIS — D0511 Intraductal carcinoma in situ of right breast: Secondary | ICD-10-CM | POA: Diagnosis not present

## 2023-11-07 DIAGNOSIS — Z170 Estrogen receptor positive status [ER+]: Secondary | ICD-10-CM | POA: Diagnosis not present

## 2023-11-07 DIAGNOSIS — Z510 Encounter for antineoplastic radiation therapy: Secondary | ICD-10-CM | POA: Diagnosis not present

## 2023-11-07 LAB — RAD ONC ARIA SESSION SUMMARY
Course Elapsed Days: 1
Plan Fractions Treated to Date: 2
Plan Prescribed Dose Per Fraction: 2.66 Gy
Plan Total Fractions Prescribed: 16
Plan Total Prescribed Dose: 42.56 Gy
Reference Point Dosage Given to Date: 5.32 Gy
Reference Point Session Dosage Given: 2.66 Gy
Session Number: 2

## 2023-11-08 ENCOUNTER — Other Ambulatory Visit: Payer: Self-pay

## 2023-11-08 ENCOUNTER — Ambulatory Visit
Admission: RE | Admit: 2023-11-08 | Discharge: 2023-11-08 | Discharge status: Home / Self Care | Payer: 59 | Source: Ambulatory Visit | Attending: Radiation Oncology

## 2023-11-08 DIAGNOSIS — Z510 Encounter for antineoplastic radiation therapy: Secondary | ICD-10-CM | POA: Diagnosis not present

## 2023-11-08 LAB — RAD ONC ARIA SESSION SUMMARY
Course Elapsed Days: 2
Plan Fractions Treated to Date: 3
Plan Prescribed Dose Per Fraction: 2.66 Gy
Plan Total Fractions Prescribed: 16
Plan Total Prescribed Dose: 42.56 Gy
Reference Point Dosage Given to Date: 7.98 Gy
Reference Point Session Dosage Given: 2.66 Gy
Session Number: 3

## 2023-11-09 ENCOUNTER — Other Ambulatory Visit: Payer: Self-pay

## 2023-11-09 ENCOUNTER — Ambulatory Visit
Admission: RE | Admit: 2023-11-09 | Discharge: 2023-11-09 | Disposition: A | Discharge status: Home / Self Care | Payer: 59 | Source: Ambulatory Visit | Attending: Radiation Oncology | Admitting: Radiation Oncology

## 2023-11-09 DIAGNOSIS — Z510 Encounter for antineoplastic radiation therapy: Secondary | ICD-10-CM | POA: Diagnosis not present

## 2023-11-09 LAB — RAD ONC ARIA SESSION SUMMARY
Course Elapsed Days: 3
Plan Fractions Treated to Date: 4
Plan Prescribed Dose Per Fraction: 2.66 Gy
Plan Total Fractions Prescribed: 16
Plan Total Prescribed Dose: 42.56 Gy
Reference Point Dosage Given to Date: 10.64 Gy
Reference Point Session Dosage Given: 2.66 Gy
Session Number: 4

## 2023-11-10 ENCOUNTER — Ambulatory Visit
Admission: RE | Admit: 2023-11-10 | Discharge: 2023-11-10 | Disposition: A | Discharge status: Home / Self Care | Payer: 59 | Source: Ambulatory Visit | Attending: Radiation Oncology

## 2023-11-10 ENCOUNTER — Ambulatory Visit
Admission: RE | Admit: 2023-11-10 | Discharge: 2023-11-10 | Disposition: A | Discharge status: Home / Self Care | Payer: 59 | Source: Ambulatory Visit | Attending: Radiation Oncology | Admitting: Radiation Oncology

## 2023-11-10 ENCOUNTER — Other Ambulatory Visit: Payer: Self-pay

## 2023-11-10 DIAGNOSIS — Z170 Estrogen receptor positive status [ER+]: Secondary | ICD-10-CM | POA: Diagnosis not present

## 2023-11-10 DIAGNOSIS — D0511 Intraductal carcinoma in situ of right breast: Secondary | ICD-10-CM | POA: Diagnosis not present

## 2023-11-10 DIAGNOSIS — Z510 Encounter for antineoplastic radiation therapy: Secondary | ICD-10-CM | POA: Diagnosis not present

## 2023-11-10 LAB — RAD ONC ARIA SESSION SUMMARY
Course Elapsed Days: 4
Plan Fractions Treated to Date: 5
Plan Prescribed Dose Per Fraction: 2.66 Gy
Plan Total Fractions Prescribed: 16
Plan Total Prescribed Dose: 42.56 Gy
Reference Point Dosage Given to Date: 13.3 Gy
Reference Point Session Dosage Given: 2.66 Gy
Session Number: 5

## 2023-11-10 MED ORDER — ALRA NON-METALLIC DEODORANT (RAD-ONC)
1.0000 | Freq: Once | TOPICAL | Status: AC
  Administered 2023-11-10: 1 via TOPICAL

## 2023-11-10 MED ORDER — RADIAPLEXRX EX GEL
Freq: Once | CUTANEOUS | Status: AC
Start: 2023-11-10 — End: 2023-11-10

## 2023-11-13 ENCOUNTER — Ambulatory Visit
Admission: RE | Admit: 2023-11-13 | Discharge: 2023-11-13 | Disposition: A | Discharge status: Home / Self Care | Payer: 59 | Source: Ambulatory Visit | Attending: Radiation Oncology

## 2023-11-13 ENCOUNTER — Other Ambulatory Visit: Payer: Self-pay

## 2023-11-13 DIAGNOSIS — Z170 Estrogen receptor positive status [ER+]: Secondary | ICD-10-CM | POA: Diagnosis not present

## 2023-11-13 DIAGNOSIS — Z510 Encounter for antineoplastic radiation therapy: Secondary | ICD-10-CM | POA: Diagnosis not present

## 2023-11-13 DIAGNOSIS — D0511 Intraductal carcinoma in situ of right breast: Secondary | ICD-10-CM | POA: Diagnosis not present

## 2023-11-13 LAB — RAD ONC ARIA SESSION SUMMARY
Course Elapsed Days: 7
Plan Fractions Treated to Date: 6
Plan Prescribed Dose Per Fraction: 2.66 Gy
Plan Total Fractions Prescribed: 16
Plan Total Prescribed Dose: 42.56 Gy
Reference Point Dosage Given to Date: 15.96 Gy
Reference Point Session Dosage Given: 2.66 Gy
Session Number: 6

## 2023-11-14 ENCOUNTER — Ambulatory Visit
Admission: RE | Admit: 2023-11-14 | Discharge: 2023-11-14 | Disposition: A | Discharge status: Home / Self Care | Payer: 59 | Source: Ambulatory Visit | Attending: Radiation Oncology | Admitting: Radiation Oncology

## 2023-11-14 ENCOUNTER — Other Ambulatory Visit: Payer: Self-pay

## 2023-11-14 DIAGNOSIS — Z510 Encounter for antineoplastic radiation therapy: Secondary | ICD-10-CM | POA: Diagnosis not present

## 2023-11-14 LAB — RAD ONC ARIA SESSION SUMMARY
Course Elapsed Days: 8
Plan Fractions Treated to Date: 7
Plan Prescribed Dose Per Fraction: 2.66 Gy
Plan Total Fractions Prescribed: 16
Plan Total Prescribed Dose: 42.56 Gy
Reference Point Dosage Given to Date: 18.62 Gy
Reference Point Session Dosage Given: 2.66 Gy
Session Number: 7

## 2023-11-15 ENCOUNTER — Inpatient Hospital Stay (HOSPITAL_BASED_OUTPATIENT_CLINIC_OR_DEPARTMENT_OTHER): Payer: 59 | Admitting: Hematology and Oncology

## 2023-11-15 ENCOUNTER — Ambulatory Visit
Admission: RE | Admit: 2023-11-15 | Discharge: 2023-11-15 | Disposition: A | Discharge status: Home / Self Care | Payer: 59 | Source: Ambulatory Visit | Attending: Radiation Oncology

## 2023-11-15 ENCOUNTER — Inpatient Hospital Stay: Payer: 59

## 2023-11-15 ENCOUNTER — Other Ambulatory Visit: Payer: Self-pay

## 2023-11-15 VITALS — BP 115/67 | HR 84 | Temp 97.9°F | Resp 18 | Ht 64.0 in | Wt 151.9 lb

## 2023-11-15 DIAGNOSIS — Z1721 Progesterone receptor positive status: Secondary | ICD-10-CM | POA: Insufficient documentation

## 2023-11-15 DIAGNOSIS — Z510 Encounter for antineoplastic radiation therapy: Secondary | ICD-10-CM | POA: Diagnosis not present

## 2023-11-15 DIAGNOSIS — D0511 Intraductal carcinoma in situ of right breast: Secondary | ICD-10-CM | POA: Insufficient documentation

## 2023-11-15 DIAGNOSIS — Z170 Estrogen receptor positive status [ER+]: Secondary | ICD-10-CM | POA: Insufficient documentation

## 2023-11-15 LAB — RAD ONC ARIA SESSION SUMMARY
Course Elapsed Days: 9
Plan Fractions Treated to Date: 8
Plan Prescribed Dose Per Fraction: 2.66 Gy
Plan Total Fractions Prescribed: 16
Plan Total Prescribed Dose: 42.56 Gy
Reference Point Dosage Given to Date: 21.28 Gy
Reference Point Session Dosage Given: 2.66 Gy
Session Number: 8

## 2023-11-15 MED ORDER — TAMOXIFEN CITRATE 10 MG PO TABS: 10.0000 mg | ORAL_TABLET | Freq: Every day | ORAL | 3 refills | Status: DC

## 2023-11-15 NOTE — Assessment & Plan Note (Signed)
08/03/2023: Right lumpectomy: (Screening mammogram detected right breast calcifications): Intermediate to high-grade DCIS, margins negative, 2 cm, ER 95%, PR 50%  Pathology review: I discussed with the patient the difference between DCIS and invasive breast cancer. It is considered a precancerous lesion. DCIS is classified as a 0. It is generally detected through mammograms as calcifications. We discussed the significance of grades and its impact on prognosis. We also discussed the importance of ER and PR receptors and their implications to adjuvant treatment options. Prognosis of DCIS dependence on grade, comedo necrosis. It is anticipated that if not treated, 20-30% of DCIS can develop into invasive breast cancer.  Recommendation: 1. adjuvant radiation therapy 2. Followed by antiestrogen therapy with tamoxifen 5 years  Tamoxifen counseling: We discussed the risks and benefits of tamoxifen. These include but not limited to insomnia, hot flashes, mood changes, vaginal dryness, and weight gain. Although rare, serious side effects including endometrial cancer, risk of blood clots were also discussed. We strongly believe that the benefits far outweigh the risks. Patient understands these risks and consented to starting treatment. Planned treatment duration is 5 years.  Return to clinic after surgery to discuss the final pathology report and come up with an adjuvant treatment plan.

## 2023-11-15 NOTE — Progress Notes (Unsigned)
Patient Care Team: Gerre Scull, NP as PCP - General (Internal Medicine)  DIAGNOSIS:  Encounter Diagnosis  Name Primary?   Ductal carcinoma in situ (DCIS) of right breast Yes    SUMMARY OF ONCOLOGIC HISTORY: Oncology History  Ductal carcinoma in situ (DCIS) of right breast  10/30/2023 Initial Diagnosis   Ductal carcinoma in situ (DCIS) of right breast   11/15/2023 Cancer Staging   Staging form: Breast, AJCC 8th Edition - Clinical: Stage 0 (cTis (DCIS), cN0, cM0, ER+, PR+, HER2: Not Assessed) - Signed by Serena Croissant, MD on 11/15/2023 Stage prefix: Initial diagnosis Nuclear grade: G3     CHIEF COMPLIANT:   HISTORY OF PRESENT ILLNESS: Discussed the use of AI scribe software for clinical note transcription with the patient, who gave verbal consent to proceed.  History of Present Illness             ALLERGIES:  has no known allergies.  MEDICATIONS:  Current Outpatient Medications  Medication Sig Dispense Refill   cetirizine (ZYRTEC) 10 MG tablet Take 1 tablet by mouth once daily (Patient not taking: Reported on 09/27/2023) 180 tablet 0   cycloSPORINE (RESTASIS) 0.05 % ophthalmic emulsion Place 1 drop into both eyes 2 (two) times daily.     fluticasone (FLONASE) 50 MCG/ACT nasal spray Place 2 sprays into both nostrils daily. (Patient not taking: Reported on 09/27/2023) 16 g 0   guaiFENesin (MUCINEX) 600 MG 12 hr tablet Take 1 tablet (600 mg total) by mouth 2 (two) times daily as needed for cough or to loosen phlegm. (Patient not taking: Reported on 07/03/2023) 60 tablet 0   ipratropium (ATROVENT) 0.06 % nasal spray USE 2 SPRAY(S) IN EACH NOSTRIL 4 TIMES DAILY AS NEEDED FOR NASAL CONGESTION OR RUNNY NOSE (Patient not taking: Reported on 09/27/2023) 15 mL 1   montelukast (SINGULAIR) 10 MG tablet Take 1 tablet (10 mg total) by mouth at bedtime. (Patient not taking: Reported on 09/27/2023) 30 tablet 3   omeprazole (PRILOSEC) 20 MG capsule Take 1 capsule (20 mg total) by  mouth daily. (Patient not taking: Reported on 09/27/2023) 90 capsule 3   oxyCODONE (OXY IR/ROXICODONE) 5 MG immediate release tablet Take 1 tablet (5 mg total) by mouth every 6 (six) hours as needed for severe pain (pain score 7-10). 15 tablet 0   Vitamin D, Ergocalciferol, (DRISDOL) 1.25 MG (50000 UNIT) CAPS capsule Take 1 capsule (50,000 Units total) by mouth every 7 (seven) days. (Patient not taking: Reported on 09/27/2023) 12 capsule 1   No current facility-administered medications for this visit.    PHYSICAL EXAMINATION: ECOG PERFORMANCE STATUS: 1 - Symptomatic but completely ambulatory  There were no vitals filed for this visit. There were no vitals filed for this visit.  Physical Exam          (exam performed in the presence of a chaperone)  LABORATORY DATA:  I have reviewed the data as listed    Latest Ref Rng & Units 07/25/2023    8:52 AM 03/08/2023    8:21 AM 03/06/2023   11:58 AM  CMP  Glucose 70 - 99 mg/dL 73  409  83   BUN 6 - 23 mg/dL 13  19  15    Creatinine 0.40 - 1.20 mg/dL 8.11  9.14  7.82   Sodium 135 - 145 mEq/L 140  139  138   Potassium 3.5 - 5.1 mEq/L 4.2  4.3  6.9 grossly hemolyzed   Chloride 96 - 112 mEq/L 101  104  104  CO2 19 - 32 mEq/L 31  25  23    Calcium 8.4 - 10.5 mg/dL 9.1  8.8  9.4   Total Protein 6.0 - 8.3 g/dL 6.8   7.6   Total Bilirubin 0.2 - 1.2 mg/dL 0.6   0.5   Alkaline Phos 39 - 117 U/L 62   66   AST 0 - 37 U/L 20   33   ALT 0 - 35 U/L 28   21     Lab Results  Component Value Date   WBC 5.4 09/29/2023   HGB 13.4 09/29/2023   HCT 42.0 09/29/2023   MCV 90.1 09/29/2023   PLT 226 09/29/2023   NEUTROABS 2.5 07/25/2023    ASSESSMENT & PLAN:  Ductal carcinoma in situ (DCIS) of right breast 08/03/2023: Right lumpectomy: (Screening mammogram detected right breast calcifications): Intermediate to high-grade DCIS, margins negative, 2 cm, ER 95%, PR 50%  Pathology review: I discussed with the patient the difference between DCIS and invasive  breast cancer. It is considered a precancerous lesion. DCIS is classified as a 0. It is generally detected through mammograms as calcifications. We discussed the significance of grades and its impact on prognosis. We also discussed the importance of ER and PR receptors and their implications to adjuvant treatment options. Prognosis of DCIS dependence on grade, comedo necrosis. It is anticipated that if not treated, 20-30% of DCIS can develop into invasive breast cancer.  Recommendation: 1. adjuvant radiation therapy 2. Followed by antiestrogen therapy with tamoxifen 5 years  Tamoxifen counseling: We discussed the risks and benefits of tamoxifen. These include but not limited to insomnia, hot flashes, mood changes, vaginal dryness, and weight gain. Although rare, serious side effects including endometrial cancer, risk of blood clots were also discussed. We strongly believe that the benefits far outweigh the risks. Patient understands these risks and consented to starting treatment. Planned treatment duration is 5 years.  Return to clinic after surgery to discuss the final pathology report and come up with an adjuvant treatment plan.  ------------------------------------- Assessment and Plan               No orders of the defined types were placed in this encounter.  The patient has a good understanding of the overall plan. she agrees with it. she will call with any problems that may develop before the next visit here. Total time spent: 30 mins including face to face time and time spent for planning, charting and co-ordination of care   Tamsen Meek, MD 11/15/23

## 2023-11-16 ENCOUNTER — Ambulatory Visit
Admission: RE | Admit: 2023-11-16 | Discharge: 2023-11-16 | Disposition: A | Discharge status: Home / Self Care | Payer: 59 | Source: Ambulatory Visit | Attending: Radiation Oncology | Admitting: Radiation Oncology

## 2023-11-16 ENCOUNTER — Encounter: Payer: Self-pay | Admitting: *Deleted

## 2023-11-16 ENCOUNTER — Other Ambulatory Visit: Payer: Self-pay

## 2023-11-16 DIAGNOSIS — Z510 Encounter for antineoplastic radiation therapy: Secondary | ICD-10-CM | POA: Diagnosis not present

## 2023-11-16 DIAGNOSIS — D0511 Intraductal carcinoma in situ of right breast: Secondary | ICD-10-CM

## 2023-11-16 LAB — RAD ONC ARIA SESSION SUMMARY
Course Elapsed Days: 10
Plan Fractions Treated to Date: 9
Plan Prescribed Dose Per Fraction: 2.66 Gy
Plan Total Fractions Prescribed: 16
Plan Total Prescribed Dose: 42.56 Gy
Reference Point Dosage Given to Date: 23.94 Gy
Reference Point Session Dosage Given: 2.66 Gy
Session Number: 9

## 2023-11-17 ENCOUNTER — Ambulatory Visit
Admission: RE | Admit: 2023-11-17 | Discharge: 2023-11-17 | Disposition: A | Discharge status: Home / Self Care | Payer: 59 | Source: Ambulatory Visit | Attending: Radiation Oncology | Admitting: Radiation Oncology

## 2023-11-17 ENCOUNTER — Other Ambulatory Visit: Payer: Self-pay

## 2023-11-17 DIAGNOSIS — Z510 Encounter for antineoplastic radiation therapy: Secondary | ICD-10-CM | POA: Diagnosis not present

## 2023-11-17 DIAGNOSIS — Z170 Estrogen receptor positive status [ER+]: Secondary | ICD-10-CM | POA: Diagnosis not present

## 2023-11-17 DIAGNOSIS — D0511 Intraductal carcinoma in situ of right breast: Secondary | ICD-10-CM | POA: Diagnosis not present

## 2023-11-17 LAB — RAD ONC ARIA SESSION SUMMARY
Course Elapsed Days: 11
Plan Fractions Treated to Date: 10
Plan Prescribed Dose Per Fraction: 2.66 Gy
Plan Total Fractions Prescribed: 16
Plan Total Prescribed Dose: 42.56 Gy
Reference Point Dosage Given to Date: 26.6 Gy
Reference Point Session Dosage Given: 2.66 Gy
Session Number: 10

## 2023-11-20 ENCOUNTER — Other Ambulatory Visit: Payer: Self-pay

## 2023-11-20 ENCOUNTER — Ambulatory Visit
Admission: RE | Admit: 2023-11-20 | Discharge: 2023-11-20 | Disposition: A | Discharge status: Home / Self Care | Payer: 59 | Source: Ambulatory Visit | Attending: Radiation Oncology

## 2023-11-20 DIAGNOSIS — Z510 Encounter for antineoplastic radiation therapy: Secondary | ICD-10-CM | POA: Diagnosis not present

## 2023-11-20 LAB — RAD ONC ARIA SESSION SUMMARY
Course Elapsed Days: 14
Plan Fractions Treated to Date: 11
Plan Prescribed Dose Per Fraction: 2.66 Gy
Plan Total Fractions Prescribed: 16
Plan Total Prescribed Dose: 42.56 Gy
Reference Point Dosage Given to Date: 29.26 Gy
Reference Point Session Dosage Given: 2.66 Gy
Session Number: 11

## 2023-11-21 ENCOUNTER — Other Ambulatory Visit: Payer: Self-pay

## 2023-11-21 ENCOUNTER — Ambulatory Visit
Admission: RE | Admit: 2023-11-21 | Discharge: 2023-11-21 | Disposition: A | Discharge status: Home / Self Care | Payer: 59 | Source: Ambulatory Visit | Attending: Radiation Oncology | Admitting: Radiation Oncology

## 2023-11-21 DIAGNOSIS — Z510 Encounter for antineoplastic radiation therapy: Secondary | ICD-10-CM | POA: Diagnosis not present

## 2023-11-21 LAB — RAD ONC ARIA SESSION SUMMARY
Course Elapsed Days: 15
Plan Fractions Treated to Date: 12
Plan Prescribed Dose Per Fraction: 2.66 Gy
Plan Total Fractions Prescribed: 16
Plan Total Prescribed Dose: 42.56 Gy
Reference Point Dosage Given to Date: 31.92 Gy
Reference Point Session Dosage Given: 2.66 Gy
Session Number: 12

## 2023-11-23 ENCOUNTER — Other Ambulatory Visit: Payer: Self-pay

## 2023-11-23 ENCOUNTER — Ambulatory Visit
Admission: RE | Admit: 2023-11-23 | Discharge: 2023-11-23 | Disposition: A | Discharge status: Home / Self Care | Payer: 59 | Source: Ambulatory Visit | Attending: Radiation Oncology | Admitting: Radiation Oncology

## 2023-11-23 DIAGNOSIS — Z510 Encounter for antineoplastic radiation therapy: Secondary | ICD-10-CM | POA: Diagnosis not present

## 2023-11-23 LAB — RAD ONC ARIA SESSION SUMMARY
Course Elapsed Days: 17
Plan Fractions Treated to Date: 13
Plan Prescribed Dose Per Fraction: 2.66 Gy
Plan Total Fractions Prescribed: 16
Plan Total Prescribed Dose: 42.56 Gy
Reference Point Dosage Given to Date: 34.58 Gy
Reference Point Session Dosage Given: 2.66 Gy
Session Number: 13

## 2023-11-24 ENCOUNTER — Ambulatory Visit
Admission: RE | Admit: 2023-11-24 | Discharge: 2023-11-24 | Disposition: A | Discharge status: Home / Self Care | Payer: 59 | Source: Ambulatory Visit | Attending: Radiation Oncology | Admitting: Radiation Oncology

## 2023-11-24 ENCOUNTER — Ambulatory Visit
Admission: RE | Admit: 2023-11-24 | Discharge: 2023-11-24 | Disposition: A | Discharge status: Home / Self Care | Payer: 59 | Source: Ambulatory Visit | Attending: Radiation Oncology

## 2023-11-24 ENCOUNTER — Other Ambulatory Visit: Payer: Self-pay

## 2023-11-24 DIAGNOSIS — Z510 Encounter for antineoplastic radiation therapy: Secondary | ICD-10-CM | POA: Diagnosis not present

## 2023-11-24 LAB — RAD ONC ARIA SESSION SUMMARY
Course Elapsed Days: 18
Plan Fractions Treated to Date: 14
Plan Prescribed Dose Per Fraction: 2.66 Gy
Plan Total Fractions Prescribed: 16
Plan Total Prescribed Dose: 42.56 Gy
Reference Point Dosage Given to Date: 37.24 Gy
Reference Point Session Dosage Given: 2.66 Gy
Session Number: 14

## 2023-11-27 ENCOUNTER — Other Ambulatory Visit: Payer: Self-pay

## 2023-11-27 ENCOUNTER — Ambulatory Visit
Admission: RE | Admit: 2023-11-27 | Discharge: 2023-11-27 | Disposition: A | Discharge status: Home / Self Care | Payer: 59 | Source: Ambulatory Visit | Attending: Radiation Oncology | Admitting: Radiation Oncology

## 2023-11-27 DIAGNOSIS — Z510 Encounter for antineoplastic radiation therapy: Secondary | ICD-10-CM | POA: Diagnosis not present

## 2023-11-27 DIAGNOSIS — Z170 Estrogen receptor positive status [ER+]: Secondary | ICD-10-CM | POA: Diagnosis not present

## 2023-11-27 DIAGNOSIS — D0511 Intraductal carcinoma in situ of right breast: Secondary | ICD-10-CM | POA: Diagnosis not present

## 2023-11-27 LAB — RAD ONC ARIA SESSION SUMMARY
Course Elapsed Days: 21
Plan Fractions Treated to Date: 15
Plan Prescribed Dose Per Fraction: 2.66 Gy
Plan Total Fractions Prescribed: 16
Plan Total Prescribed Dose: 42.56 Gy
Reference Point Dosage Given to Date: 39.9 Gy
Reference Point Session Dosage Given: 2.66 Gy
Session Number: 15

## 2023-11-28 ENCOUNTER — Ambulatory Visit
Admission: RE | Admit: 2023-11-28 | Discharge: 2023-11-28 | Disposition: A | Discharge status: Home / Self Care | Payer: 59 | Source: Ambulatory Visit | Attending: Radiation Oncology | Admitting: Radiation Oncology

## 2023-11-28 ENCOUNTER — Other Ambulatory Visit: Payer: Self-pay

## 2023-11-28 DIAGNOSIS — Z170 Estrogen receptor positive status [ER+]: Secondary | ICD-10-CM | POA: Diagnosis not present

## 2023-11-28 DIAGNOSIS — D0511 Intraductal carcinoma in situ of right breast: Secondary | ICD-10-CM | POA: Diagnosis not present

## 2023-11-28 DIAGNOSIS — Z510 Encounter for antineoplastic radiation therapy: Secondary | ICD-10-CM | POA: Diagnosis not present

## 2023-11-28 LAB — RAD ONC ARIA SESSION SUMMARY
Course Elapsed Days: 22
Plan Fractions Treated to Date: 16
Plan Prescribed Dose Per Fraction: 2.66 Gy
Plan Total Fractions Prescribed: 16
Plan Total Prescribed Dose: 42.56 Gy
Reference Point Dosage Given to Date: 42.56 Gy
Reference Point Session Dosage Given: 2.66 Gy
Session Number: 16

## 2023-11-30 ENCOUNTER — Other Ambulatory Visit: Payer: Self-pay | Admitting: Nurse Practitioner

## 2023-11-30 NOTE — Radiation Completion Notes (Addendum)
  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  Intent: Curative     ==========DELIVERED PLANS==========  First Treatment Date: 2023-11-06 Last Treatment Date: 2023-11-28   Plan Name: Breast_R Site: Breast, Right Technique: 3D Mode: Photon Dose Per Fraction: 2.66 Gy Prescribed Dose (Delivered / Prescribed): 42.56 Gy / 42.56 Gy Prescribed Fxs (Delivered / Prescribed): 16 / 16     ==========ON TREATMENT VISIT DATES========== 2023-11-10, 2023-11-17, 2023-11-24    See weekly On Treatment Notes in Epic for details in the Media tab (listed as Progress notes on the On Treatment Visit Dates listed above). The patient tolerated radiation. She developed fatigue and anticipated skin changes in the treatment field. Her course was completed after 16 fractions without a boost as she is travelling internationally.   The patient will receive a call in about one month from the radiation oncology department. She will continue follow up with Dr. Gudena as well.      Donald KYM Husband, PAC

## 2023-11-30 NOTE — Telephone Encounter (Signed)
 Requesting: EQ CETIRIZINE 10MG  TAB  Last Visit: 07/25/2023 Next Visit: 01/30/2024 Last Refill: 07/14/2023  Please Advise

## 2024-01-01 ENCOUNTER — Ambulatory Visit
Admission: RE | Admit: 2024-01-01 | Discharge: 2024-01-01 | Disposition: A | Discharge status: Home / Self Care | Payer: 59 | Source: Ambulatory Visit | Attending: Adult Health | Admitting: Adult Health

## 2024-01-01 NOTE — Progress Notes (Signed)
  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 (as documented in provider EOT note)  The patient was not available for call today. Call made through [facility] ID# [phone]. Voicemail full.  The patient was encouraged to avoid sun exposure in the area of prior treatment for up to one year following radiation with either sunscreen or by the style of clothing worn in the sun.  The patient has scheduled follow up with her medical oncologist Dr. Pamelia Hoit for ongoing surveillance, and was encouraged to call if she develops concerns or questions regarding radiation.    Ruel Favors, LPN

## 2024-01-23 ENCOUNTER — Ambulatory Visit: Payer: 59 | Admitting: Nurse Practitioner

## 2024-01-30 ENCOUNTER — Encounter: Payer: Self-pay | Admitting: Nurse Practitioner

## 2024-01-30 ENCOUNTER — Ambulatory Visit (INDEPENDENT_AMBULATORY_CARE_PROVIDER_SITE_OTHER): Payer: 59 | Admitting: Nurse Practitioner

## 2024-01-30 VITALS — BP 104/72 | HR 82 | Temp 98.1°F | Ht 64.0 in | Wt 148.6 lb

## 2024-01-30 DIAGNOSIS — Z1159 Encounter for screening for other viral diseases: Secondary | ICD-10-CM

## 2024-01-30 DIAGNOSIS — D0511 Intraductal carcinoma in situ of right breast: Secondary | ICD-10-CM | POA: Diagnosis not present

## 2024-01-30 DIAGNOSIS — Z23 Encounter for immunization: Secondary | ICD-10-CM

## 2024-01-30 DIAGNOSIS — Z0184 Encounter for antibody response examination: Secondary | ICD-10-CM | POA: Diagnosis not present

## 2024-01-30 DIAGNOSIS — R7303 Prediabetes: Secondary | ICD-10-CM | POA: Diagnosis not present

## 2024-01-30 DIAGNOSIS — E559 Vitamin D deficiency, unspecified: Secondary | ICD-10-CM

## 2024-01-30 DIAGNOSIS — K219 Gastro-esophageal reflux disease without esophagitis: Secondary | ICD-10-CM | POA: Diagnosis not present

## 2024-01-30 LAB — CBC WITH DIFFERENTIAL/PLATELET
Basophils Absolute: 0 10*3/uL (ref 0.0–0.1)
Basophils Relative: 0.7 % (ref 0.0–3.0)
Eosinophils Absolute: 0.1 10*3/uL (ref 0.0–0.7)
Eosinophils Relative: 4.2 % (ref 0.0–5.0)
HCT: 40.9 % (ref 36.0–46.0)
Hemoglobin: 13.4 g/dL (ref 12.0–15.0)
Lymphocytes Relative: 24.3 % (ref 12.0–46.0)
Lymphs Abs: 0.9 10*3/uL (ref 0.7–4.0)
MCHC: 32.8 g/dL (ref 30.0–36.0)
MCV: 90.3 fl (ref 78.0–100.0)
Monocytes Absolute: 0.3 10*3/uL (ref 0.1–1.0)
Monocytes Relative: 8.1 % (ref 3.0–12.0)
Neutro Abs: 2.2 10*3/uL (ref 1.4–7.7)
Neutrophils Relative %: 62.7 % (ref 43.0–77.0)
Platelets: 224 10*3/uL (ref 150.0–400.0)
RBC: 4.53 Mil/uL (ref 3.87–5.11)
RDW: 12.9 % (ref 11.5–15.5)
WBC: 3.5 10*3/uL — ABNORMAL LOW (ref 4.0–10.5)

## 2024-01-30 LAB — BASIC METABOLIC PANEL
BUN: 15 mg/dL (ref 6–23)
CO2: 34 meq/L — ABNORMAL HIGH (ref 19–32)
Calcium: 9.4 mg/dL (ref 8.4–10.5)
Chloride: 100 meq/L (ref 96–112)
Creatinine, Ser: 0.7 mg/dL (ref 0.40–1.20)
GFR: 99.74 mL/min (ref >60.00)
Glucose, Bld: 86 mg/dL (ref 70–99)
Potassium: 4.7 meq/L (ref 3.5–5.1)
Sodium: 139 meq/L (ref 135–145)

## 2024-01-30 LAB — HEMOGLOBIN A1C: Hgb A1c MFr Bld: 5.5 % (ref 4.6–6.5)

## 2024-01-30 LAB — VITAMIN D 25 HYDROXY (VIT D DEFICIENCY, FRACTURES): VITD: 16.35 ng/mL — ABNORMAL LOW (ref 30.00–100.00)

## 2024-01-30 MED ORDER — OMEPRAZOLE 20 MG PO CPDR: 20.0000 mg | DELAYED_RELEASE_CAPSULE | Freq: Every day | ORAL | 3 refills | Status: DC

## 2024-01-30 MED ORDER — IPRATROPIUM BROMIDE 0.06 % NA SOLN: NASAL | 1 refills | Status: DC

## 2024-01-30 MED ORDER — FLUTICASONE PROPIONATE 50 MCG/ACT NA SUSP: 2.0000 | Freq: Every day | NASAL | 0 refills | Status: DC

## 2024-01-30 NOTE — Assessment & Plan Note (Signed)
Chronic, stable. Check A1c today and treat based on results.

## 2024-01-30 NOTE — Assessment & Plan Note (Signed)
 Will check vitamin D levels and treat based on results. She has finished vitamin D prescription supplement.

## 2024-01-30 NOTE — Assessment & Plan Note (Signed)
 She is currently taking tamoxifen and following with oncology. Continue collaboration and recommendations from specialist.

## 2024-01-30 NOTE — Progress Notes (Signed)
 Established Patient Office Visit  Subjective   Patient ID: Sierra Hensley, female    DOB: 1971-01-18  Age: 53 y.o. MRN: [id number]  Chief Complaint  Patient presents with   Breast Problem    Had recent surgery on right breast, Rx Refills, allergies   Visit completed with in person interpreter  HPI  Sierra Hensley is here to follow-up on GERD, allergies, and recent DCIS diagnosis.   She states that she is doing well. She denies chest pain, shortness of breath, constipation, and diarrhea. She needs refills on her omeprazole and flonase today.   She was recently diagnosed with DCIS of the right breast. She had a lumpectomy, radiation, and is now taking tamoxifen for 5 years. She is following regularly with oncology. She is having some fatigue, but otherwise doing well.     ROS See pertinent positives and negatives per HPI.    Objective:     BP 104/72 (BP Location: Left Arm, Patient Position: Sitting, Cuff Size: Normal)   Pulse 82   Temp 98.1 F (36.7 C) (Oral)   Ht 5\' 4"  (1.626 m)   Wt 148 lb 9.6 oz (67.4 kg)   SpO2 100%   BMI 25.51 kg/m    Physical Exam Vitals and nursing note reviewed.  Constitutional:      General: She is not in acute distress.    Appearance: Normal appearance.  HENT:     Head: Normocephalic.  Eyes:     Conjunctiva/sclera: Conjunctivae normal.  Cardiovascular:     Rate and Rhythm: Normal rate and regular rhythm.     Pulses: Normal pulses.     Heart sounds: Normal heart sounds.  Pulmonary:     Effort: Pulmonary effort is normal.     Breath sounds: Normal breath sounds.  Musculoskeletal:     Cervical back: Normal range of motion.  Skin:    General: Skin is warm.  Neurological:     General: No focal deficit present.     Mental Status: She is alert and oriented to person, place, and time.  Psychiatric:        Mood and Affect: Mood normal.        Behavior: Behavior normal.        Thought Content: Thought content normal.        Judgment:  Judgment normal.    The 10-year ASCVD risk score (Arnett DK, et al., 2019) is: 0.8%    Assessment & Plan:   Problem List Items Addressed This Visit       Digestive   Gastroesophageal reflux disease   Chronic, stable.  Continue omeprazole 20 mg daily. Refill sent to the pharmacy.       Relevant Medications   omeprazole (PRILOSEC) 20 MG capsule     Other   Vitamin D insufficiency   Will check vitamin D levels and treat based on results. She has finished vitamin D prescription supplement.       Relevant Orders   VITAMIN D 25 Hydroxy (Vit-D Deficiency, Fractures)   Ductal carcinoma in situ (DCIS) of right breast - Primary   She is currently taking tamoxifen and following with oncology. Continue collaboration and recommendations from specialist.       Prediabetes   Chronic, stable. Check A1c today and treat based on results.       Relevant Orders   Basic metabolic panel   CBC with Differential/Platelet   Hemoglobin A1c   Other Visit Diagnoses  Immunization due       Prevnar 20 given today   Relevant Orders   Pneumococcal conjugate vaccine 20-valent     Need for hepatitis B screening test       Relevant Orders   Hepatitis B surface antibody,quantitative   Hepatitis B surface antigen       Return in about 6 months (around 08/01/2024) for CPE.    Gerre Scull, NP

## 2024-01-30 NOTE — Patient Instructions (Signed)
 It was great to see you!  We are checking your labs today and will let you know the results via mychart/phone.   I have refilled your medications  We are giving you prevnar 20 today  Let's follow-up in 6 months, sooner if you have concerns.  If a referral was placed today, you will be contacted for an appointment. Please note that routine referrals can sometimes take up to 3-4 weeks to process. Please call our office if you haven't heard anything after this time frame.  Take care,  Rodman Pickle, NP

## 2024-01-30 NOTE — Assessment & Plan Note (Signed)
Chronic, stable.  Continue omeprazole 20 mg daily.  Refill sent to the pharmacy.

## 2024-01-31 LAB — HEPATITIS B SURFACE ANTIBODY, QUANTITATIVE: Hep B S AB Quant (Post): 290 m[IU]/mL (ref >=10)

## 2024-01-31 LAB — HEPATITIS B SURFACE ANTIGEN: Hepatitis B Surface Ag: NONREACTIVE

## 2024-01-31 MED ORDER — VITAMIN D (ERGOCALCIFEROL) 1.25 MG (50000 UNIT) PO CAPS: 50000.0000 [IU] | ORAL_CAPSULE | ORAL | 0 refills | Status: DC

## 2024-01-31 NOTE — Addendum Note (Signed)
 Addended by: Rodman Pickle A on: 01/31/2024 08:43 PM   Modules accepted: Orders

## 2024-02-27 ENCOUNTER — Encounter: Payer: Self-pay | Admitting: *Deleted

## 2024-02-27 NOTE — Progress Notes (Signed)
 Working on care plan to be translated before [facility] appt on 03/25/24.

## 2024-03-01 ENCOUNTER — Telehealth: Payer: Self-pay

## 2024-03-01 ENCOUNTER — Other Ambulatory Visit (HOSPITAL_COMMUNITY): Payer: Self-pay

## 2024-03-01 NOTE — Telephone Encounter (Signed)
 Pharmacy Patient Advocate Encounter   Received notification from CoverMyMeds that prior authorization for Omeprazole 20MG  dr capsules is required/requested.   Insurance verification completed.   The patient is insured through [facility] .   Per test claim: PA required; PA submitted to above mentioned insurance via CoverMyMeds Key/confirmation #/EOC [id number] Status is pending

## 2024-03-01 NOTE — Telephone Encounter (Signed)
 Pharmacy Patient Advocate Encounter  Received notification from [facility] that Prior Authorization for Omeprazole 20MG  dr capsules has been APPROVED from 041/04/25 to 03/01/25. Ran test claim, Copay is $0. This test claim was processed through [facility]- copay amounts may vary at other pharmacies due to pharmacy/plan contracts, or as the patient moves through the different stages of their insurance plan.   PA #/Case ID/Reference #: [id number]

## 2024-03-25 ENCOUNTER — Encounter: Payer: Self-pay | Admitting: Adult Health

## 2024-03-25 ENCOUNTER — Inpatient Hospital Stay: Payer: 59 | Attending: Adult Health | Admitting: Adult Health

## 2024-03-25 VITALS — BP 108/64 | HR 77 | Temp 98.0°F | Resp 18 | Ht 64.0 in | Wt 148.0 lb

## 2024-03-25 DIAGNOSIS — Z170 Estrogen receptor positive status [ER+]: Secondary | ICD-10-CM | POA: Diagnosis not present

## 2024-03-25 DIAGNOSIS — Z1721 Progesterone receptor positive status: Secondary | ICD-10-CM | POA: Insufficient documentation

## 2024-03-25 DIAGNOSIS — N6489 Other specified disorders of breast: Secondary | ICD-10-CM | POA: Insufficient documentation

## 2024-03-25 DIAGNOSIS — Z923 Personal history of irradiation: Secondary | ICD-10-CM | POA: Diagnosis not present

## 2024-03-25 DIAGNOSIS — D0511 Intraductal carcinoma in situ of right breast: Secondary | ICD-10-CM | POA: Diagnosis not present

## 2024-03-25 DIAGNOSIS — Z79810 Long term (current) use of selective estrogen receptor modulators (SERMs): Secondary | ICD-10-CM | POA: Insufficient documentation

## 2024-03-25 NOTE — Progress Notes (Signed)
 SURVIVORSHIP VISIT:  BRIEF ONCOLOGIC HISTORY:  Oncology History  Ductal carcinoma in situ (DCIS) of right breast  10/03/2023 Surgery   Right Breast Lumpectomy: DCIS; DCIS present in anterior margin; DCIS 1.5 mm from posterior margin; no lymph nodes submitted; ER+/PR+   10/30/2023 Initial Diagnosis   Ductal carcinoma in situ (DCIS) of right breast   11/06/2023 - 11/28/2023 Radiation Therapy   Right Breast: 42.56 Gy in 16 treatments   11/15/2023 Cancer Staging   Staging form: Breast, AJCC 8th Edition - Clinical: Stage 0 (cTis (DCIS), cN0, cM0, ER+, PR+, HER2: Not Assessed) - Signed by Cameron Cea, MD on 11/15/2023 Stage prefix: Initial diagnosis Nuclear grade: G3   01/16/2024 -  Anti-estrogen oral therapy   Tamoxifen      INTERVAL HISTORY:  Ms. Carapia to review her survivorship care plan detailing her treatment course for breast cancer, as well as monitoring long-term side effects of that treatment, education regarding health maintenance, screening, and overall wellness and health promotion.   He is accompanied by [place] interpreter  Overall, Ms. Bemus reports feeling quite well.  She continues on tamoxifen  daily.  She reports generally feeling normal and is healing from the radiation. She admits to occasionally missing doses of tamoxifen  but is advised to aim for less than once a week. She denies experiencing any hot flashes or vaginal discharge, common side effects of tamoxifen . The patient also reports tenderness in the area of the breast that received radiation, which is confirmed during the physical exam.  REVIEW OF SYSTEMS:  Review of Systems  Constitutional:  Negative for appetite change, chills, fatigue, fever and unexpected weight change.  HENT:   Negative for hearing loss, lump/mass and trouble swallowing.   Eyes:  Negative for eye problems and icterus.  Respiratory:  Negative for chest tightness, cough and shortness of breath.   Cardiovascular:  Negative for chest pain,  leg swelling and palpitations.  Gastrointestinal:  Negative for abdominal distention, abdominal pain, constipation, diarrhea, nausea and vomiting.  Endocrine: Negative for hot flashes.  Genitourinary:  Negative for difficulty urinating.   Musculoskeletal:  Negative for arthralgias.  Skin:  Negative for itching and rash.  Neurological:  Negative for dizziness, extremity weakness, headaches and numbness.  Hematological:  Negative for adenopathy. Does not bruise/bleed easily.  Psychiatric/Behavioral:  Negative for depression. The patient is not nervous/anxious.    Breast: Denies any new nodularity, masses, tenderness, nipple changes, or nipple discharge.       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   GERD (gastroesophageal reflux disease)    Small bowel obstruction (HCC) 10/13/2018   Past Surgical History:  Procedure Laterality Date   BREAST BIOPSY Right 09/14/2023   MM RT BREAST BX W LOC DEV 1ST LESION IMAGE BX SPEC STEREO GUIDE 09/14/2023 [facility]MO[facility]   BREAST BIOPSY  10/02/2023   MM RT RADIOACTIVE SEED LOC MAMMO GUIDE 10/02/2023 [facility]MO[facility]   BREAST LUMPECTOMY WITH RADIOACTIVE SEED LOCALIZATION Right 10/03/2023   Procedure: RIGHT BREAST SEED LUMPECTOMY;  Surgeon: Sim Dryer, MD;  Location: [facility] OR;  Service: General;  Laterality: Right;   CESAREAN SECTION MULTI-GESTATIONAL N/A 12/09/2014   Procedure: CESAREAN SECTION MULTI-GESTATIONAL;  Surgeon: Cyd Dowse, MD;  Location: [facility]S;  Service: Obstetrics;  Laterality: N/A;   HERNIA REPAIR     LAPAROSCOPIC APPENDECTOMY N/A 10/03/2018   Procedure: APPENDECTOMY LAPAROSCOPIC ERAS PATHWAY;  Surgeon: Dorrie Gaudier,  Alphonso Aschoff, MD;  Location[facility];  Service: General;  Laterality: N/A;   LASIK Bilateral      ALLERGIES:  No Known Allergies   CURRENT  MEDICATIONS:  Outpatient Encounter Medications as of 03/25/2024  Medication Sig   cetirizine  (ZYRTEC ) 10 MG tablet Take 1 tablet by mouth once daily   cycloSPORINE (RESTASIS) 0.05 % ophthalmic emulsion Place 1 drop into both eyes 2 (two) times daily.   fluticasone  (FLONASE ) 50 MCG/ACT nasal spray Place 2 sprays into both nostrils daily.   ipratropium (ATROVENT ) 0.06 % nasal spray USE 2 SPRAY(S) IN EACH NOSTRIL 4 TIMES DAILY AS NEEDED FOR NASAL CONGESTION OR RUNNY NOSE   omeprazole  (PRILOSEC) 20 MG capsule Take 1 capsule (20 mg total) by mouth daily.   tamoxifen  (NOLVADEX ) 10 MG tablet Take 1 tablet (10 mg total) by mouth daily.   Vitamin D , Ergocalciferol , (DRISDOL ) 1.25 MG (50000 UNIT) CAPS capsule Take 1 capsule (50,000 Units total) by mouth every 7 (seven) days.   [DISCONTINUED] metoCLOPramide  (REGLAN ) 10 MG tablet Take 1 tablet (10 mg total) by mouth 3 (three) times daily before meals. (Patient not taking: No sig reported)   No facility-administered encounter medications on file as of 03/25/2024.     ONCOLOGIC FAMILY HISTORY:  Family History  Family history unknown: Yes     SOCIAL HISTORY:  Social History   Socioeconomic History   Marital status: Divorced    Spouse name: Not on file   Number of children: 3   Years of education: Not on file   Highest education level: Not on file  Occupational History   Not on file  Tobacco Use   Smoking status: Never   Smokeless tobacco: Never  Vaping Use   Vaping status: Never Used  Substance and Sexual Activity   Alcohol use: No   Drug use: No   Sexual activity: Yes    Birth control/protection: Post-menopausal  Other Topics Concern   Not on file  Social History Narrative   ** Merged History Encounter **       Social Drivers of Corporate investment banker Strain: Not on file  Food Insecurity: No Food Insecurity (02/16/2021)   Received from [facility], [facility]   Hunger Vital Sign    Worried About Running Out of Food in  the Last Year: Never true    Ran Out of Food in the Last Year: Never true  Transportation Needs: Not on file  Physical Activity: Not on file  Stress: Not on file  Social Connections: Unknown (04/07/2022)   [facility] from [facility], [facility]   Social Network    Social Network: Not on file  Intimate Partner Violence: Unknown (03/02/2022)   Received from [facility], [facility]   HITS    Physically Hurt: Not on file    Insult or Talk Down To: Not on file    Threaten Physical Harm: Not on file    Scream or Curse: Not on file     OBSERVATIONS/OBJECTIVE:  BP 108/64 (BP Location: Right Arm, Patient Position: Sitting)   Pulse 77   Temp 98 F (36.7 C) (Tympanic)   Resp 18   Ht 5\' 4"  (1.626 m)   Wt 148 lb (67.1 kg)   SpO2 100%   BMI 25.40 kg/m  GENERAL: Patient is a well appearing female in no acute distress HEENT:  Sclerae anicteric.  Oropharynx clear and moist. No ulcerations or evidence of oropharyngeal candidiasis. Neck is supple.  NODES:  No  cervical, supraclavicular, or axillary lymphadenopathy palpated.  BREAST EXAM: Right breast status postlumpectomy and radiation, small seroma noted at lumpectomy site, no sign of local recurrence, left breast is benign LUNGS:  Clear to auscultation bilaterally.  No wheezes or rhonchi. HEART:  Regular rate and rhythm. No murmur appreciated. ABDOMEN:  Soft, nontender.  Positive, normoactive bowel sounds. No organomegaly palpated. MSK:  No focal spinal tenderness to palpation. Full range of motion bilaterally in the upper extremities. EXTREMITIES:  No peripheral edema.   SKIN:  Clear with no obvious rashes or skin changes. No nail dyscrasia. NEURO:  Nonfocal. Well oriented.  Appropriate affect.   LABORATORY DATA:  None for this visit.  DIAGNOSTIC IMAGING:  None for this visit.      ASSESSMENT AND PLAN:  Ms.. Clester is a pleasant 53 y.o. female with Stage 0 right breast DCIS, ER+/PR+, diagnosed in 09/2023, treated with  lumpectomy, adjuvant radiation therapy, and anti-estrogen therapy with tamoxifen  beginning in 12/2023.  She presents to the Survivorship Clinic for our initial meeting and routine follow-up post-completion of treatment for breast cancer.    1. Stage 0 right breast cancer:  Ms. Bansal is continuing to recover from definitive treatment for breast cancer. She will follow-up with her medical oncologist, Dr. Lee Public in 6 months with history and physical exam per surveillance protocol.  She will continue her anti-estrogen therapy with tamoxifen . Thus far, she is tolerating the tamoxifen  well, with minimal side effects. Her mammogram is due 07/2024; orders placed today.   Today, a comprehensive survivorship care plan and treatment summary was reviewed with the patient today detailing her breast cancer diagnosis, treatment course, potential late/long-term effects of treatment, appropriate follow-up care with recommendations for the future, and patient education resources.  A copy of this summary, along with a letter will be sent to the patient's primary care provider via mail/fax/In Basket message after today's visit.    2. Seroma in breast Post-surgical seroma with tenderness, expected to resolve over time. Needle aspiration not recommended due to reaccumulation and infection risk. - Monitor for changes in size, pain, or erythema--educated to contact our office if these develop - Wear a well-fitting bra. - Consider gentle massages with vitamin E oil or coconut oil.  3. Bone health:  She was given education on specific activities to promote bone health.  4. Cancer screening:  Due to Ms. Mezquita's history and her age, she should receive screening for skin cancers, colon cancer, and gynecologic cancers.  The information and recommendations are listed on the patient's comprehensive care plan/treatment summary and were reviewed in detail with the patient.    5. Health maintenance and wellness promotion: Ms. Milhouse  was encouraged to consume 5-7 servings of fruits and vegetables per day. We reviewed the "Nutrition Rainbow" handout.  She was also encouraged to engage in moderate to vigorous exercise for 30 minutes per day most days of the week.  She was instructed to limit her alcohol consumption and continue to abstain from tobacco use.     6. Support services/counseling: It is not uncommon for this period of the patient's cancer care trajectory to be one of many emotions and stressors.   She was given information regarding our available services and encouraged to contact me with any questions or for help enrolling in any of our support group/programs.    Follow up instructions:    -Return to cancer center in 6 months -Mammogram due in 07/2024 -She is welcome to return back to the Survivorship Clinic at  any time; no additional follow-up needed at this time.  -Consider referral back to survivorship as a long-term survivor for continued surveillance  The patient was provided an opportunity to ask questions and all were answered. The patient agreed with the plan and demonstrated an understanding of the instructions.   Total encounter time:45 minutes*in face-to-face visit time, chart review, lab review, care coordination, order entry, and documentation of the encounter time.    Alwin Baars, NP 03/25/24 9:36 AM Medical Oncology and Hematology [facility] [address] [place], [place] [address] Tel. [phone]    Fax. [phone]  *Total Encounter Time as defined by the Centers for Medicare and Medicaid Services includes, in addition to the face-to-face time of a patient visit (documented in the note above) non-face-to-face time: obtaining and reviewing outside history, ordering and reviewing medications, tests or procedures, care coordination (communications with other health care professionals or caregivers) and documentation in the medical record.

## 2024-05-22 ENCOUNTER — Encounter: Payer: Self-pay | Admitting: Nurse Practitioner

## 2024-05-22 ENCOUNTER — Ambulatory Visit: Admitting: Nurse Practitioner

## 2024-05-22 VITALS — BP 108/72 | HR 82 | Temp 97.0°F | Ht 64.0 in | Wt 146.6 lb

## 2024-05-22 DIAGNOSIS — T63441A Toxic effect of venom of bees, accidental (unintentional), initial encounter: Secondary | ICD-10-CM | POA: Diagnosis not present

## 2024-05-22 DIAGNOSIS — R09A2 Foreign body sensation, throat: Secondary | ICD-10-CM | POA: Diagnosis not present

## 2024-05-22 DIAGNOSIS — D229 Melanocytic nevi, unspecified: Secondary | ICD-10-CM

## 2024-05-22 DIAGNOSIS — K219 Gastro-esophageal reflux disease without esophagitis: Secondary | ICD-10-CM | POA: Diagnosis not present

## 2024-05-22 DIAGNOSIS — E559 Vitamin D deficiency, unspecified: Secondary | ICD-10-CM

## 2024-05-22 MED ORDER — VITAMIN D (ERGOCALCIFEROL) 1.25 MG (50000 UNIT) PO CAPS: 50000.0000 [IU] | ORAL_CAPSULE | ORAL | 0 refills | Status: DC

## 2024-05-22 MED ORDER — PANTOPRAZOLE SODIUM 40 MG PO TBEC: 40.0000 mg | DELAYED_RELEASE_TABLET | Freq: Every day | ORAL | 1 refills | Status: DC

## 2024-05-22 MED ORDER — TRIAMCINOLONE ACETONIDE 0.1 % EX CREA: 1.0000 | TOPICAL_CREAM | Freq: Two times a day (BID) | CUTANEOUS | 0 refills | Status: DC

## 2024-05-22 NOTE — Assessment & Plan Note (Signed)
 She completed vitamin D  supplementation and requires a refill . Refill vitamin D  supplement 50,000 units weekly and recheck vitamin D  levels in September.

## 2024-05-22 NOTE — Progress Notes (Signed)
 Established Patient Office Visit  Subjective   Patient ID: Sierra Hensley, female    DOB: 09-Dec-1970  Age: 53 y.o. MRN: [id number]  Chief Complaint  Patient presents with   Heartburn    Feels like food is not going down, belching, referral to have an endoscopy, Rx of Vitamin D    Visit completed using video interpreter   HPI:  Discussed the use of AI scribe software for clinical note transcription with the patient, who gave verbal consent to proceed.  History of Present Illness   Sierra Hensley is a 53 year old female who presents with difficulty swallowing solid foods.  She experiences a sensation of food getting stuck in her throat when eating solid foods, localized to the neck area. This does not occur with liquids. She has been experiencing burping for some time and takes omeprazole  intermittently with minimal relief. There is no chest pain, shortness of breath, or burning sensation in the chest.  She has multiple moles spreading over her body and requests a referral to a dermatologist. She completed a vitamin D  supplement and requires a refill. She was stung by a bee this morning, resulting in pain and swelling but no itching.        ROS See pertinent positives and negatives per HPI.    Objective:     BP 108/72 (BP Location: Left Arm, Patient Position: Sitting, Cuff Size: Normal)   Pulse 82   Temp (!) 97 F (36.1 C)   Ht 5' 4 (1.626 m)   Wt 146 lb 9.6 oz (66.5 kg)   SpO2 97%   BMI 25.16 kg/m    Physical Exam Vitals and nursing note reviewed.  Constitutional:      General: She is not in acute distress.    Appearance: Normal appearance.  HENT:     Head: Normocephalic.   Eyes:     Conjunctiva/sclera: Conjunctivae normal.    Cardiovascular:     Rate and Rhythm: Normal rate and regular rhythm.     Pulses: Normal pulses.     Heart sounds: Normal heart sounds.  Pulmonary:     Effort: Pulmonary effort is normal.     Breath sounds: Normal breath sounds.   Abdominal:     Palpations: Abdomen is soft.     Tenderness: There is no abdominal tenderness.   Musculoskeletal:     Cervical back: Normal range of motion.   Skin:    General: Skin is warm.     Comments: Red area with swelling to right ribs area. Multiple nevi across abdomen, chest, back   Neurological:     General: No focal deficit present.     Mental Status: She is alert and oriented to person, place, and time.   Psychiatric:        Mood and Affect: Mood normal.        Behavior: Behavior normal.        Thought Content: Thought content normal.        Judgment: Judgment normal.    The 10-year ASCVD risk score (Arnett DK, et al., 2019) is: 0.9%    Assessment & Plan:   Problem List Items Addressed This Visit       Digestive   Gastroesophageal reflux disease   Chronic, not controlled. She feels like the omeprazole  stopped helping. Start protonix 40mg  daily.       Relevant Medications   pantoprazole (PROTONIX) 40 MG tablet   Other Relevant Orders   Ambulatory referral to  Gastroenterology     Other   Vitamin D  insufficiency   She completed vitamin D  supplementation and requires a refill . Refill vitamin D  supplement 50,000 units weekly and recheck vitamin D  levels in September.       Globus sensation - Primary   She experiences a sensation of food sticking in the upper chest with all food types except water. Discontinue omeprazole  and start Protonix 40mg  once daily. Refer to gastroenterology for further evaluation.      Relevant Orders   Ambulatory referral to Gastroenterology   Other Visit Diagnoses       Multiple nevi       She has multiple moles on the body, warranting a dermatology referral. Refer to dermatology for evaluation.   Relevant Orders   Ambulatory referral to Dermatology     Bee sting, accidental or unintentional, initial encounter       She was stung by a bee this AM. Apply ice to the affected area several times daily. Prescribe triamcinolone  cream, to be applied twice daily as needed.        Return if symptoms worsen or fail to improve.    Sierra DELENA Harada, NP

## 2024-05-22 NOTE — Assessment & Plan Note (Signed)
 Chronic, not controlled. She feels like the omeprazole  stopped helping. Start protonix 40mg  daily.

## 2024-05-22 NOTE — Assessment & Plan Note (Signed)
 She experiences a sensation of food sticking in the upper chest with all food types except water. Discontinue omeprazole  and start Protonix 40mg  once daily. Refer to gastroenterology for further evaluation.

## 2024-05-22 NOTE — Patient Instructions (Addendum)
 It was great to see you!  I have placed a referral to GI, they will call to schedule [phone]  Stop the omeprazole  and start pantoprazole once a day  I have placed a referral to dermatology, they will call to schedule [phone]  Start cream twice a day to the bee sting area as needed   Let's follow-up at your next visit in September  Take care,  Tinnie Harada, NP

## 2024-06-04 DIAGNOSIS — D1801 Hemangioma of skin and subcutaneous tissue: Secondary | ICD-10-CM | POA: Diagnosis not present

## 2024-06-04 DIAGNOSIS — L820 Inflamed seborrheic keratosis: Secondary | ICD-10-CM | POA: Diagnosis not present

## 2024-06-04 DIAGNOSIS — Z129 Encounter for screening for malignant neoplasm, site unspecified: Secondary | ICD-10-CM | POA: Diagnosis not present

## 2024-06-04 DIAGNOSIS — L821 Other seborrheic keratosis: Secondary | ICD-10-CM | POA: Diagnosis not present

## 2024-07-25 DIAGNOSIS — Z01419 Encounter for gynecological examination (general) (routine) without abnormal findings: Secondary | ICD-10-CM | POA: Diagnosis not present

## 2024-07-25 DIAGNOSIS — Z1389 Encounter for screening for other disorder: Secondary | ICD-10-CM | POA: Diagnosis not present

## 2024-07-25 DIAGNOSIS — N926 Irregular menstruation, unspecified: Secondary | ICD-10-CM | POA: Diagnosis not present

## 2024-07-31 NOTE — Progress Notes (Unsigned)
 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 She presents to our office today as referred by Sierra Hensley for further evaluation regarding globus sensation. She is accompanied by a [facility] [place] interpreter who facilitated communication throughout today's office visit. She describes feeling like food remains in her throat after eating which occurs once weekly for the past year. She denies having any difficulty swallowing solids, liquids or tablets. During episodes when she feels like food is in her throat, she is able drink water without any difficulty. She also endorses having acid reflux symptoms twice weekly for several years with increased burping. No upper or lower abdominal pain. She is passing normal formed brown bowel movement daily. No bloody or black stools.  She denies ever having an EGD.  She stated undergoing a colonoscopy in [place] in 2019 which she believes was normal.  Cologuard test 05/2022 was negative.  No known family history of esophageal, gastric or colon cancer.  No NSAID use.     Latest Ref Rng & Units 01/30/2024    8:56 AM 09/29/2023   10:07 AM 07/25/2023    8:52 AM  CBC  WBC 4.0 - 10.5 K/uL 3.5  5.4  4.5   Hemoglobin 12.0 - 15.0 g/dL 86.5  86.5  86.9   Hematocrit 36.0 - 46.0 % 40.9  42.0  40.6   Platelets 150.0 - 400.0 K/uL 224.0  226  222.0        Latest Ref Rng & Units 01/30/2024    8:56 AM 07/25/2023    8:52 AM 03/08/2023    8:21 AM  CMP  Glucose 70 - 99 mg/dL 86  73  837   BUN 6 - 23 mg/dL 15  13  19    Creatinine 0.40 - 1.20 mg/dL 9.29  9.29  9.13   Sodium 135 - 145 mEq/L 139  140  139   Potassium 3.5 - 5.1 mEq/L 4.7  4.2  4.3    Chloride 96 - 112 mEq/L 100  101  104   CO2 19 - 32 mEq/L 34  31  25   Calcium  8.4 - 10.5 mg/dL 9.4  9.1  8.8   Total Protein 6.0 - 8.3 g/dL  6.8    Total Bilirubin 0.2 - 1.2 mg/dL  0.6    Alkaline Phos 39 - 117 U/L  62    AST 0 - 37 U/L  20    ALT 0 - 35 U/L  28      Cologuard 06/09/2022: Negative   Past Medical History:  Diagnosis Date   Acute appendicitis with perforation and peritoneal abscess 08/09/2018   Allergy    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   GERD (gastroesophageal reflux disease)    Small bowel obstruction (HCC) 10/13/2018   Past Surgical History:  Procedure Laterality Date   BREAST BIOPSY Right 09/14/2023   MM RT BREAST BX W LOC DEV 1ST LESION IMAGE BX SPEC STEREO GUIDE 09/14/2023 [facility]   BREAST BIOPSY  10/02/2023   MM RT RADIOACTIVE SEED LOC MAMMO GUIDE 10/02/2023 [facility]   BREAST LUMPECTOMY WITH RADIOACTIVE SEED LOCALIZATION Right 10/03/2023   Procedure: RIGHT BREAST  SEED LUMPECTOMY;  Surgeon: Vanderbilt Ned, MD;  Location: [facility];  Service: General;  Laterality: Right;   CESAREAN SECTION MULTI-GESTATIONAL N/A 12/09/2014   Procedure: CESAREAN SECTION MULTI-GESTATIONAL;  Surgeon: Krystal Deaner, MD;  Location: [facility];  Service: Obstetrics;  Laterality: N/A;   HERNIA REPAIR     LAPAROSCOPIC APPENDECTOMY N/A 10/03/2018   Procedure: APPENDECTOMY LAPAROSCOPIC ERAS PATHWAY;  Surgeon: Kinsinger, Herlene Righter, MD;  Location: [facility];  Service: General;  Laterality: N/A;   LASIK Bilateral    Social History:  Family History:    reports that she has never smoked. She has never used smokeless tobacco. She reports that she does not drink alcohol and does not use drugs. Family history is unknown by patient.  No Known Allergies     Outpatient Encounter Medications as of 08/01/2024  Medication Sig   cetirizine  (ZYRTEC ) 10 MG tablet Take 1 tablet by mouth once daily   cycloSPORINE (RESTASIS) 0.05 % ophthalmic emulsion Place 1 drop into  both eyes 2 (two) times daily.   fluticasone  (FLONASE ) 50 MCG/ACT nasal spray Place 2 sprays into both nostrils daily.   ipratropium (ATROVENT ) 0.06 % nasal spray USE 2 SPRAY(S) IN EACH NOSTRIL 4 TIMES DAILY AS NEEDED FOR NASAL CONGESTION OR RUNNY NOSE   pantoprazole  (PROTONIX ) 40 MG tablet Take 1 tablet (40 mg total) by mouth daily.   tamoxifen  (NOLVADEX ) 10 MG tablet Take 1 tablet (10 mg total) by mouth daily.   triamcinolone  cream (KENALOG ) 0.1 % Apply 1 Application topically 2 (two) times daily.   Vitamin D , Ergocalciferol , (DRISDOL ) 1.25 MG (50000 UNIT) CAPS capsule Take 1 capsule (50,000 Units total) by mouth every 7 (seven) days.   [DISCONTINUED] metoCLOPramide  (REGLAN ) 10 MG tablet Take 1 tablet (10 mg total) by mouth 3 (three) times daily before meals. (Patient not taking: No sig reported)   No facility-administered encounter medications on file as of 08/01/2024.   REVIEW OF SYSTEMS:  Gen: Denies fever, sweats or chills. No weight loss.  CV: Denies chest pain, palpitations or edema. Resp: Denies cough, shortness of breath of hemoptysis.  GI: See HPI. GU: Denies urinary burning, blood in urine, increased urinary frequency or incontinence. MS: Denies joint pain, muscles aches or weakness. Derm: Denies rash, itchiness, skin lesions or unhealing ulcers. Psych: Denies depression, anxiety, memory loss or confusion. Heme: Denies bruising, easy bleeding. Neuro:  Denies headaches, dizziness or paresthesias. Endo:  Denies any problems with DM, thyroid or adrenal function.  PHYSICAL EXAM: BP 118/70   Pulse 91   Ht 5' 4 (1.626 m)   Wt 147 lb (66.7 kg)   BMI 25.23 kg/m   Wt Readings from Last 3 Encounters:  08/01/24 147 lb (66.7 kg)  05/22/24 146 lb 9.6 oz (66.5 kg)  03/25/24 148 lb (67.1 kg)    General: 53 year old female in no acute distress. Head: Normocephalic and atraumatic. Eyes:  Sclerae non-icteric, conjunctive pink. Ears: Normal auditory acuity. Mouth: Dentition intact.  No ulcers or lesions.  Neck: Supple, no lymphadenopathy or thyromegaly.  Lungs: Clear bilaterally to auscultation without wheezes, crackles or rhonchi. Heart: Regular rate and rhythm. No murmur, rub or gallop appreciated.  Abdomen: Soft, nontender, nondistended. No masses. No hepatosplenomegaly. Normoactive bowel sounds x 4 quadrants.  Rectal: Deferred.  Musculoskeletal: Symmetrical with no gross deformities. Skin: Warm and dry. No rash or lesions on visible extremities. Extremities: No edema. Neurological: Alert oriented x 4, no focal deficits.  Psychological: Alert and cooperative. Normal mood and affect.  ASSESSMENT AND PLAN:  53  year old [place] female with GERD, globus sensation and increased burping x 1 year. Started on Pantoprazole  40mg  every day per PCP 04/2024.  - EGD benefits and risks discussed including risk with sedation, risk of bleeding, perforation and infection  - Continue Pantoprazole  40 mg daily - Start Famotidine 20 mg 1 tab p.o. nightly  Colon cancer screening.  Patient reported undergoing a colonoscopy in [place] in 2019 which she believes was normal.  Negative Cologuard 06/09/2022. 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  Shamecka presents to the office for evaluation of globus sensation, eructation and GERD.  Not necessarily having dysphagia.  Currently on pantoprazole  40 mg orally daily and agree with the addition of famotidine.  Reasonable to proceed with EGD.  She appears to be up-to-date for colorectal cancer screening.  Cologuard negative in 2023.  Can undergo colonoscopy or Cologuard in 2026.  Inocente Hausen, MD

## 2024-08-01 ENCOUNTER — Encounter: Payer: Self-pay | Admitting: Nurse Practitioner

## 2024-08-01 ENCOUNTER — Ambulatory Visit: Admitting: Nurse Practitioner

## 2024-08-01 ENCOUNTER — Encounter: Admitting: Nurse Practitioner

## 2024-08-01 VITALS — BP 118/70 | HR 91 | Ht 64.0 in | Wt 147.0 lb

## 2024-08-01 DIAGNOSIS — R09A2 Foreign body sensation, throat: Secondary | ICD-10-CM | POA: Diagnosis not present

## 2024-08-01 DIAGNOSIS — K219 Gastro-esophageal reflux disease without esophagitis: Secondary | ICD-10-CM | POA: Diagnosis not present

## 2024-08-01 MED ORDER — OMEPRAZOLE 20 MG PO CPDR: 20.0000 mg | DELAYED_RELEASE_CAPSULE | Freq: Every day | ORAL | 1 refills | Status: AC

## 2024-08-01 NOTE — Patient Instructions (Addendum)
 We have sent the following medications to your pharmacy for you to pick up at your convenience: omeprazole  20 mg daily to be taken 30 min before breakfast.   You have been scheduled for an endoscopy. Please follow written instructions given to you at your visit today.  If you use inhalers (even only as needed), please bring them with you on the day of your procedure.  If you take any of the following medications, they will need to be adjusted prior to your procedure:   DO NOT TAKE 7 DAYS PRIOR TO TEST- Trulicity (dulaglutide) Ozempic, Wegovy (semaglutide) Mounjaro (tirzepatide) Bydureon Bcise (exanatide extended release)  DO NOT TAKE 1 DAY PRIOR TO YOUR TEST Rybelsus (semaglutide) Adlyxin (lixisenatide) Victoza (liraglutide) Byetta (exanatide) ___________________________________________________________________________  _______________________________________________________  If your blood pressure at your visit was 140/90 or greater, please contact your primary care physician to follow up on this.  _______________________________________________________  If you are age 52 or older, your body mass index should be between 23-30. Your Body mass index is 25.23 kg/m. If this is out of the aforementioned range listed, please consider follow up with your Primary Care Provider.  If you are age 52 or younger, your body mass index should be between 19-25. Your Body mass index is 25.23 kg/m. If this is out of the aformentioned range listed, please consider follow up with your Primary Care Provider.   ________________________________________________________  The [facility] providers would like to encourage you to use MYCHART to communicate with providers for non-urgent requests or questions.  Due to long hold times on the telephone, sending your provider a message by [facility] may be a faster and more efficient way to get a response.  Please allow 48 business hours for a response.  Please  remember that this is for non-urgent requests.  _______________________________________________________  [facility] is using a team-based approach to care.  Your team is made up of your doctor and two to three APPS. Our APPS (Nurse Practitioners and Physician Assistants) work with your physician to ensure care continuity for you. They are fully qualified to address your health concerns and develop a treatment plan. They communicate directly with your gastroenterologist to care for you. Seeing the Advanced Practice Practitioners on your physician's team can help you by facilitating care more promptly, often allowing for earlier appointments, access to diagnostic testing, procedures, and other specialty referrals.

## 2024-08-05 NOTE — Progress Notes (Deleted)
 [facility] History and Physical   Primary Care Physician:  Nedra Tinnie LABOR, NP   Reason for Procedure:  Globus sensation, GERD  Plan:    Upper endoscopy     HPI: Sierra Hensley is a 53 y.o. female undergoing endoscopy for investigation of globus sensation and GERD.  Patient reports a 1 year history of a sensation of food sitting in her throat after eating.  This occurs once a week.  Denies dysphagia or odynophagia.  Has symptoms of GERD for which she has been treated with pantoprazole  40 mg orally daily.  At the time of recent clinic visit famotidine 20 mg p.o. nightly was added to her medication regimen.  No prior EGD.  No family history of esophageal, gastric or colon cancer.   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   GERD (gastroesophageal reflux disease)    Small bowel obstruction (HCC) 10/13/2018    Past Surgical History:  Procedure Laterality Date   BREAST BIOPSY Right 09/14/2023   MM RT BREAST BX W LOC DEV 1ST LESION IMAGE BX SPEC STEREO GUIDE 09/14/2023 [facility]   BREAST BIOPSY  10/02/2023   MM RT RADIOACTIVE SEED LOC MAMMO GUIDE 10/02/2023 [facility]   BREAST LUMPECTOMY WITH RADIOACTIVE SEED LOCALIZATION Right 10/03/2023   Procedure: RIGHT BREAST SEED LUMPECTOMY;  Surgeon: Vanderbilt Ned, MD;  Location: [facility];  Service: General;  Laterality: Right;   CESAREAN SECTION MULTI-GESTATIONAL N/A 12/09/2014   Procedure: CESAREAN SECTION MULTI-GESTATIONAL;  Surgeon: Krystal Deaner, MD;  Location: [facility];  Service: Obstetrics;  Laterality: N/A;   HERNIA REPAIR     LAPAROSCOPIC APPENDECTOMY N/A 10/03/2018   Procedure: APPENDECTOMY LAPAROSCOPIC ERAS PATHWAY;  Surgeon: Kinsinger, Herlene Righter, MD;  Location: [facility];  Service: General;  Laterality: N/A;   LASIK Bilateral     Prior to  Admission medications   Medication Sig Start Date End Date Taking? Authorizing Provider  cetirizine  (ZYRTEC ) 10 MG tablet Take 1 tablet by mouth once daily Patient taking differently: Take by mouth daily as needed for allergies. 11/30/23   McElwee, Lauren A, NP  cycloSPORINE (RESTASIS) 0.05 % ophthalmic emulsion Place 1 drop into both eyes 2 (two) times daily.    [provider]  fluticasone  (FLONASE ) 50 MCG/ACT nasal spray Place 2 sprays into both nostrils daily. Patient taking differently: Place 2 sprays into both nostrils daily as needed for allergies. 01/30/24   McElwee, Lauren A, NP  ipratropium (ATROVENT ) 0.06 % nasal spray USE 2 SPRAY(S) IN EACH NOSTRIL 4 TIMES DAILY AS NEEDED FOR NASAL CONGESTION OR RUNNY NOSE 01/30/24   McElwee, Lauren A, NP  metoCLOPramide  (REGLAN ) 10 MG tablet Take 1 tablet (10 mg total) by mouth 3 (three) times daily before meals. Patient taking differently: Take 10 mg by mouth 3 (three) times daily before meals. As needed 08/01/24 08/01/24  Kennedy-Smith, Colleen M, NP  omeprazole  (PRILOSEC) 20 MG capsule Take 1 capsule (20 mg total) by mouth daily. Take 30 min before breakfast 08/01/24   Kennedy-Smith, Colleen M, NP  tamoxifen  (NOLVADEX ) 10 MG tablet Take 1 tablet (10 mg total) by mouth daily. 01/16/24   Gudena, Vinay, MD  Vitamin D , Ergocalciferol , (DRISDOL ) 1.25 MG (50000 UNIT) CAPS capsule Take 1 capsule (50,000 Units total) by mouth every 7 (seven) days. Patient not taking: Reported on 08/01/2024 05/22/24  McElwee, Lauren A, NP    Current Outpatient Medications  Medication Sig Dispense Refill   cetirizine  (ZYRTEC ) 10 MG tablet Take 1 tablet by mouth once daily (Patient taking differently: Take by mouth daily as needed for allergies.) 180 tablet 0   cycloSPORINE (RESTASIS) 0.05 % ophthalmic emulsion Place 1 drop into both eyes 2 (two) times daily.     fluticasone  (FLONASE ) 50 MCG/ACT nasal spray Place 2 sprays into both nostrils daily. (Patient taking differently:  Place 2 sprays into both nostrils daily as needed for allergies.) 16 g 0   ipratropium (ATROVENT ) 0.06 % nasal spray USE 2 SPRAY(S) IN EACH NOSTRIL 4 TIMES DAILY AS NEEDED FOR NASAL CONGESTION OR RUNNY NOSE 15 mL 1   metoCLOPramide  (REGLAN ) 10 MG tablet Take 1 tablet (10 mg total) by mouth 3 (three) times daily before meals. (Patient taking differently: Take 10 mg by mouth 3 (three) times daily before meals. As needed) 90 tablet 0   omeprazole  (PRILOSEC) 20 MG capsule Take 1 capsule (20 mg total) by mouth daily. Take 30 min before breakfast 30 capsule 1   tamoxifen  (NOLVADEX ) 10 MG tablet Take 1 tablet (10 mg total) by mouth daily. 90 tablet 3   Vitamin D , Ergocalciferol , (DRISDOL ) 1.25 MG (50000 UNIT) CAPS capsule Take 1 capsule (50,000 Units total) by mouth every 7 (seven) days. (Patient not taking: Reported on 08/01/2024) 12 capsule 0   No current facility-administered medications for this visit.    Allergies as of 08/06/2024   (No Known Allergies)    Family History  Family history unknown: Yes    Social History   Socioeconomic History   Marital status: Married    Spouse name: Not on file   Number of children: 3   Years of education: Not on file   Highest education level: Not on file  Occupational History   Not on file  Tobacco Use   Smoking status: Never   Smokeless tobacco: Never  Vaping Use   Vaping status: Never Used  Substance and Sexual Activity   Alcohol use: No   Drug use: No   Sexual activity: Yes    Birth control/protection: Post-menopausal  Other Topics Concern   Not on file  Social History Narrative   ** Merged History Encounter **       Social Drivers of Health   Financial Resource Strain: Not on file  Food Insecurity: No Food Insecurity (02/16/2021)   [facility] from [facility]   Hunger Vital Sign    Within the past 12 months, you worried that your food would run out before you got the money to buy more.: Never true    Within the past 12 months, the  food you bought just didn't last and you didn't have money to get more.: Never true  Transportation Needs: Not on file  Physical Activity: Not on file  Stress: Not on file  Social Connections: Unknown (04/07/2022)   Received from [facility]   Social Network    Social Network: Not on file  Intimate Partner Violence: Unknown (03/02/2022)   Received from [facility]   HITS    Physically Hurt: Not on file    Insult or Talk Down To: Not on file    Threaten Physical Harm: Not on file    Scream or Curse: Not on file    Review of Systems:  All other review of systems negative except as mentioned in the HPI.  Physical Exam: Vital signs There were no vitals taken  for this visit.  General:   Alert,  Well-developed, well-nourished, pleasant and cooperative in NAD Airway:  Mallampati  Lungs:  Clear throughout to auscultation.   Heart:  Regular rate and rhythm; no murmurs, clicks, rubs,  or gallops. Abdomen:  Soft, nontender and nondistended. Normal bowel sounds.   Neuro/Psych:  Normal mood and affect. A and O x 3  Inocente Hausen, MD [facility]

## 2024-08-06 ENCOUNTER — Encounter: Admitting: Pediatrics

## 2024-08-14 ENCOUNTER — Ambulatory Visit
Admission: RE | Admit: 2024-08-14 | Discharge: 2024-08-14 | Disposition: A | Discharge status: Home / Self Care | Source: Ambulatory Visit | Attending: Adult Health | Admitting: Adult Health

## 2024-08-14 ENCOUNTER — Other Ambulatory Visit: Payer: Self-pay | Admitting: Adult Health

## 2024-08-14 DIAGNOSIS — D0511 Intraductal carcinoma in situ of right breast: Secondary | ICD-10-CM

## 2024-08-14 DIAGNOSIS — R92333 Mammographic heterogeneous density, bilateral breasts: Secondary | ICD-10-CM | POA: Diagnosis not present

## 2024-08-14 DIAGNOSIS — R928 Other abnormal and inconclusive findings on diagnostic imaging of breast: Secondary | ICD-10-CM | POA: Diagnosis not present

## 2024-08-14 DIAGNOSIS — N6489 Other specified disorders of breast: Secondary | ICD-10-CM | POA: Diagnosis not present

## 2024-09-02 ENCOUNTER — Telehealth: Payer: Self-pay | Admitting: Nurse Practitioner

## 2024-09-02 NOTE — Telephone Encounter (Signed)
 Patient needing to be further advise on prep.

## 2024-09-02 NOTE — Telephone Encounter (Signed)
 Spoke to patient using [place] interpreter line. All questions answered.

## 2024-09-03 NOTE — Progress Notes (Unsigned)
 [facility] History and Physical   Primary Care Physician:  Nedra Tinnie LABOR, NP   Reason for Procedure:  GERD, globus sensation, eructation  Plan:    Upper endoscopy     HPI: Sierra Hensley is a 53 y.o. female undergoing upper endoscopy for investigation of GERD and globus sensation.  At time of clinic visit, patient reports a sensation of feeling as though food remains in her throat after eating occurring once a week.  No frank symptoms of dysphagia to solids, liquids or tablets.  During episodes when she feels that there is food in her throat she is able to drink water without any difficulty.  Endorses symptoms of reflux and eructation.  No abdominal pain.   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   GERD (gastroesophageal reflux disease)    Small bowel obstruction (HCC) 10/13/2018    Past Surgical History:  Procedure Laterality Date   BREAST BIOPSY Right 09/14/2023   MM RT BREAST BX W LOC DEV 1ST LESION IMAGE BX SPEC STEREO GUIDE 09/14/2023 [facility]   BREAST BIOPSY  10/02/2023   MM RT RADIOACTIVE SEED LOC MAMMO GUIDE 10/02/2023 [facility]   BREAST LUMPECTOMY WITH RADIOACTIVE SEED LOCALIZATION Right 10/03/2023   Procedure: RIGHT BREAST SEED LUMPECTOMY;  Surgeon: Vanderbilt Ned, MD;  Location: [facility];  Service: General;  Laterality: Right;   CESAREAN SECTION MULTI-GESTATIONAL N/A 12/09/2014   Procedure: CESAREAN SECTION MULTI-GESTATIONAL;  Surgeon: Krystal Deaner, MD;  Location: [facility];  Service: Obstetrics;  Laterality: N/A;   HERNIA REPAIR     LAPAROSCOPIC APPENDECTOMY N/A 10/03/2018   Procedure: APPENDECTOMY LAPAROSCOPIC ERAS PATHWAY;  Surgeon: Kinsinger, Herlene Righter, MD;  Location: [facility];  Service: General;  Laterality: N/A;   LASIK Bilateral     Prior to Admission medications    Medication Sig Start Date End Date Taking? Authorizing Provider  cetirizine  (ZYRTEC ) 10 MG tablet Take 1 tablet by mouth once daily Patient taking differently: Take by mouth daily as needed for allergies. 11/30/23   McElwee, Lauren A, NP  cycloSPORINE (RESTASIS) 0.05 % ophthalmic emulsion Place 1 drop into both eyes 2 (two) times daily.    [provider]  fluticasone  (FLONASE ) 50 MCG/ACT nasal spray Place 2 sprays into both nostrils daily. Patient taking differently: Place 2 sprays into both nostrils daily as needed for allergies. 01/30/24   McElwee, Lauren A, NP  ipratropium (ATROVENT ) 0.06 % nasal spray USE 2 SPRAY(S) IN EACH NOSTRIL 4 TIMES DAILY AS NEEDED FOR NASAL CONGESTION OR RUNNY NOSE 01/30/24   McElwee, Lauren A, NP  metoCLOPramide  (REGLAN ) 10 MG tablet Take 1 tablet (10 mg total) by mouth 3 (three) times daily before meals. Patient taking differently: Take 10 mg by mouth 3 (three) times daily before meals. As needed 08/01/24 08/01/24  Kennedy-Smith, Colleen M, NP  omeprazole  (PRILOSEC) 20 MG capsule Take 1 capsule (20 mg total) by mouth daily. Take 30 min before breakfast 08/01/24   Kennedy-Smith, Colleen M, NP  tamoxifen  (NOLVADEX ) 10 MG tablet Take 1 tablet (10 mg total) by mouth daily. 01/16/24   Gudena, Vinay, MD  Vitamin D , Ergocalciferol , (DRISDOL ) 1.25 MG (50000 UNIT) CAPS capsule Take 1 capsule (50,000 Units total) by mouth every 7 (seven) days. Patient not taking: Reported on 08/01/2024 05/22/24   Nedra Tinnie LABOR, NP    Current  Outpatient Medications  Medication Sig Dispense Refill   cycloSPORINE (RESTASIS) 0.05 % ophthalmic emulsion Place 1 drop into both eyes 2 (two) times daily.     tamoxifen  (NOLVADEX ) 10 MG tablet Take 1 tablet (10 mg total) by mouth daily. 90 tablet 3   cetirizine  (ZYRTEC ) 10 MG tablet Take 1 tablet by mouth once daily (Patient taking differently: Take by mouth daily as needed for allergies.) 180 tablet 0   fluticasone  (FLONASE ) 50 MCG/ACT nasal spray  Place 2 sprays into both nostrils daily. (Patient taking differently: Place 2 sprays into both nostrils daily as needed for allergies.) 16 g 0   ipratropium (ATROVENT ) 0.06 % nasal spray USE 2 SPRAY(S) IN EACH NOSTRIL 4 TIMES DAILY AS NEEDED FOR NASAL CONGESTION OR RUNNY NOSE 15 mL 1   metoCLOPramide  (REGLAN ) 10 MG tablet Take 1 tablet (10 mg total) by mouth 3 (three) times daily before meals. (Patient taking differently: Take 10 mg by mouth 3 (three) times daily before meals. As needed) 90 tablet 0   omeprazole  (PRILOSEC) 20 MG capsule Take 1 capsule (20 mg total) by mouth daily. Take 30 min before breakfast 30 capsule 1   Vitamin D , Ergocalciferol , (DRISDOL ) 1.25 MG (50000 UNIT) CAPS capsule Take 1 capsule (50,000 Units total) by mouth every 7 (seven) days. (Patient not taking: No sig reported) 12 capsule 0   Current Facility-Administered Medications  Medication Dose Route Frequency Provider Last Rate Last Admin   0.9 %  sodium chloride  infusion  500 mL Intravenous Once Hammond Obeirne, Inocente HERO, MD        Allergies as of 09/04/2024   (No Known Allergies)    Family History  Problem Relation Age of Onset   Colon cancer Neg Hx    Esophageal cancer Neg Hx    Stomach cancer Neg Hx    Rectal cancer Neg Hx     Social History   Socioeconomic History   Marital status: Married    Spouse name: Not on file   Number of children: 3   Years of education: Not on file   Highest education level: Not on file  Occupational History   Not on file  Tobacco Use   Smoking status: Never   Smokeless tobacco: Never  Vaping Use   Vaping status: Never Used  Substance and Sexual Activity   Alcohol use: No   Drug use: No   Sexual activity: Yes    Birth control/protection: Post-menopausal  Other Topics Concern   Not on file  Social History Narrative   ** Merged History Encounter **       Social Drivers of Health   Financial Resource Strain: Not on file  Food Insecurity: No Food Insecurity (02/16/2021)    Received from [facility]   Hunger Vital Sign    Within the past 12 months, you worried that your food would run out before you got the money to buy more.: Never true    Within the past 12 months, the food you bought just didn't last and you didn't have money to get more.: Never true  Transportation Needs: Not on file  Physical Activity: Not on file  Stress: Not on file  Social Connections: Unknown (04/07/2022)   Received from [facility]   Social Network    Social Network: Not on file  Intimate Partner Violence: Unknown (03/02/2022)   Received from [facility]   HITS    Physically Hurt: Not on file    Insult or Talk Down To: Not on  file    Threaten Physical Harm: Not on file    Scream or Curse: Not on file    Review of Systems:  All other review of systems negative except as mentioned in the HPI.  Physical Exam: Vital signs BP 110/75   Pulse 70   Temp 98.1 F (36.7 C) (Skin)   Resp 19   Ht 5' 4 (1.626 m)   Wt 147 lb (66.7 kg)   SpO2 100%   BMI 25.23 kg/m   General:   Alert,  Well-developed, well-nourished, pleasant and cooperative in NAD Airway:  Mallampati 1 Lungs:  Clear throughout to auscultation.   Heart:  Regular rate and rhythm; no murmurs, clicks, rubs,  or gallops. Abdomen:  Soft, nontender and nondistended. Normal bowel sounds.   Neuro/Psych:  Normal mood and affect. A and O x 3  Inocente Hausen, MD [facility]

## 2024-09-04 ENCOUNTER — Encounter: Payer: Self-pay | Admitting: Pediatrics

## 2024-09-04 ENCOUNTER — Ambulatory Visit (AMBULATORY_SURGERY_CENTER): Admitting: Pediatrics

## 2024-09-04 VITALS — BP 122/82 | HR 71 | Temp 98.1°F | Resp 21 | Ht 64.0 in | Wt 147.0 lb

## 2024-09-04 DIAGNOSIS — R09A2 Foreign body sensation, throat: Secondary | ICD-10-CM | POA: Diagnosis not present

## 2024-09-04 DIAGNOSIS — K2950 Unspecified chronic gastritis without bleeding: Secondary | ICD-10-CM

## 2024-09-04 DIAGNOSIS — K31A15 Gastric intestinal metaplasia without dysplasia, involving multiple sites: Secondary | ICD-10-CM

## 2024-09-04 DIAGNOSIS — K219 Gastro-esophageal reflux disease without esophagitis: Secondary | ICD-10-CM | POA: Diagnosis not present

## 2024-09-04 MED ORDER — SODIUM CHLORIDE 0.9 % IV SOLN: 500.0000 mL | Freq: Once | INTRAVENOUS | Status: DC

## 2024-09-04 NOTE — Op Note (Signed)
 [facility] Patient Name: Sierra Hensley Procedure Date: 09/04/2024 10:53 AM MRN: [id number] Endoscopist: Inocente Hausen , MD, [id number] Age: 53 Referring MD:  Date of Birth: [birth year] Gender: Female Account #: [id number] Procedure:                Upper GI endoscopy Indications:              Follow-up of gastro-esophageal reflux disease,                            Globus sensation Medicines:                Monitored Anesthesia Care Procedure:                Pre-Anesthesia Assessment:                           - Prior to the procedure, a History and Physical                            was performed, and patient medications and                            allergies were reviewed. The patient's tolerance of                            previous anesthesia was also reviewed. The risks                            and benefits of the procedure and the sedation                            options and risks were discussed with the patient.                            All questions were answered, and informed consent                            was obtained. Prior Anticoagulants: The patient has                            taken no anticoagulant or antiplatelet agents. ASA                            Grade Assessment: III - A patient with severe                            systemic disease. After reviewing the risks and                            benefits, the patient was deemed in satisfactory                            condition to undergo the procedure.  After obtaining informed consent, the endoscope was                            passed under direct vision. Throughout the                            procedure, the patient's blood pressure, pulse, and                            oxygen saturations were monitored continuously. The                            GIF [id number] #[id number] was introduced through the                            mouth, and advanced to the second part of  duodenum.                            The upper GI endoscopy was accomplished without                            difficulty. The patient tolerated the procedure                            well. Scope In: Scope Out: Findings:                 The examined esophagus was normal. Biopsies were                            obtained from the proximal and distal esophagus                            with cold forceps for histology for evaluation of                            eosinophilic esophagitis. A guidewire was placed                            and the scope was withdrawn. Dilation was performed                            with a Savary dilator with no resistance at 16 mm.                           The gastric body, gastric antrum, cardia (on                            retroflexion) and gastric fundus (on retroflexion)                            were normal. Biopsies were taken with a cold  forceps for Helicobacter pylori testing.                           The duodenal bulb and second portion of the                            duodenum were normal. Complications:            No immediate complications. Estimated blood loss:                            Minimal. Estimated Blood Loss:     Estimated blood loss was minimal. Impression:               - Normal esophagus. Dilated.                           - Normal gastric body, antrum, cardia and gastric                            fundus. Biopsied.                           - Normal duodenal bulb and second portion of the                            duodenum.                           - Biopsies were taken with a cold forceps for                            evaluation of eosinophilic esophagitis. Recommendation:           - Discharge patient to home (ambulatory).                           - Await pathology results.                           - The findings and recommendations were discussed                            with the  patient's family.                           - Patient has a contact number available for                            emergencies. The signs and symptoms of potential                            delayed complications were discussed with the                            patient. Return to normal activities tomorrow.  Written discharge instructions were provided to the                            patient. Inocente Hausen, MD 09/04/2024 11:21:30 AM This report has been signed electronically.

## 2024-09-04 NOTE — Patient Instructions (Signed)
 Please read handouts provided. Await pathology results.HM NAY QU V? ? TH?C HI?N TH? THU?T N?I SOI T?I TRUNG TM N?I SOI [place]: Vui lng xem bo co th? thu?t ? ???c g?i cho qu v?, n?u qu v? c b?t k? th?c m?c g trong su?t qu trnh th?m khm. N?u bo co th? thu?t khng th? gi?i ?p th?c m?c c?a qu v?, vui lng g?i cho bc s? chuyn khoa tiu ha c?a qu v? ?? ???c gi?i ?p. N?u qu v? ? yu c?u khng cung c?p thng tin chi ti?t v? k?t qu? th? thu?t cho ??i tc ch?m [place] c?a qu v?, th bo co th? thu?t s? ???c g?i trong m?t phong b ???c dn kn ?? qu v? xem khi thu?n ti?n.   QU V? C TH?: C?m gic ch??ng b?ng. Trung ti?n nhi?u h?n bnh th??ng. ?i b? c th? gip ??y ra ngoi khng kh ?i vo ???ng tiu ha trong khi th?c hi?n th? thu?t v gi?m ch??ng b?ng. N?u qu v? ti?n hnh n?i soi d??i (nh? n?i soi ??i trng ho?c soi k?t trng xch-ma b?ng ?ng m?m), qu v? c th? th?y cc ch?m mu ? phn ho?c trn gi?y v? sinh. N?u qu v? ? lm s?ch ??i trng ?? th?c hi?n th? thu?t, qu v? c th? khng ?i ??i ti?n nh? bnh th??ng trong vi ngy.  Vui Lng L?u : Qu v? c th? b? kch ?ng v ngh?t m?i ho?c ch?y n??c m?i. Tnh tr?ng ny l do ?nh h??ng c?a vi?c th? bnh oxy trong qu trnh th?c hi?n th? thu?t. Qu v? khng c?n lo l?ng, tnh tr?ng ny s? bi?n m?t sau m?t ho?c vi ngy.   CC TRI?U CH?NG C?N BO CO NGAY  Sau khi th?c hi?n n?i soi d??i (n?i soi ??i trng ho?c soi k?t trng xch-ma b?ng ?ng m?m): Phn c nhi?u mu ?au b?ng d? d?i ho?c ngy cng t?ng Xu?t hi?n v?t s?ng b?ng m?i, c?p tnh S?t t? 100F tr? ln   Sau khi th?c hi?n n?i soi trn (EGD)  Nn ra mu ho?c ch?t mu c ph s?m Xu?t hi?n c?n ?au ng?c ho?c ?au d??i x??ng b? vai m?i Nu?t ?au ho?c kh nu?t M?i b? kh th? S?t t? 100F tr? ln Phn ?en nh? m?c  ??i v?i cc v?n ?? kh?n c?p ho?c c?p c?u, qu v? c th? lin h? bc s? chuyn khoa tiu ha b?t k? lc no b?ng cch g?i ??n s? [phone].   CH? ?? ?N U?NG: Chng ti [employer]  v? tr??c tin nn ?n nh?, nh?ng sau ? qu v? c th? ?n theo ch? ?? bnh th??ng. U?ng nhi?u n??c nh?ng ph?i trnh ?? u?ng c c?n trong 24 gi?.  HO?T ??NG: Qu v? c?n ln k? ho?ch ?? ngh? ng?i trong ngy hm nay v KHNG NN LI XE ho?c s? d?ng my mc n?ng cho ??n ngy mai (do tc d?ng c?a thu?c an th?n s? d?ng trong th? thu?t).   THEO DI: Nhn vin c?a chng ti s? g?i cho qu v? theo s? ?i?n tho?i trong b?nh n vo ngy lm vi?c ti?p theo sau ngy th?c hi?n th? thu?t ?? ki?m tra tnh tr?ng c?a qu v? v gi?i ?p cc cu h?i ho?c th?c m?c c?a qu v? v? thng tin m qu v? ???c cung c?p sau khi th?c hi?n th? thu?t. N?u chng ti khng lin l?c ???c v?i qu v?, chng ti s? ?? l?i tin nh?n. [employer],  n?u qu v? c?m th?y kh?e v khng g?p b?t k? s? c? no, qu v? khng c?n g?i l?i cho chng ti. Chng ti s? gi? ??nh r?ng qu v? ? tr? l?i sinh ho?t bnh th??ng v khng g?p b?t k? s? c? no.  N?u qu v? ???c l?y sinh thi?t, chng ti s? lin l?c v?i qu v? qua ?i?n tho?i ho?c th? trong 1-3 tu?n ti?p theo. Vui lng g?i cho chng ti theo s? [phone] n?u qu v? khng nh?n ???c thng tin v? k?t qu? sinh thi?t trong 3 tu?n.  CH? K/B?O M?TBETHA Do v? v/ho?c ??i tc ch?m [employer] c?a qu v? ? k vo cc ti li?u s? ???c nh?p vo h? s? y t? ?i?n t? c?a qu v?. Ch? k ny xc nh?n r?ng cc thng tin trn ?y trong B?n Tm T?t Sau Khi Th?m Khm c?a qu v? ? ???c xem xt v hi?u r. Qu v? v/ho?c   YOU HAD AN ENDOSCOPIC PROCEDURE TODAY AT THE [facility]:   Refer to the procedure report that was given to you for any specific questions about what was found during the examination.  If the procedure report does not answer your questions, please call your gastroenterologist to clarify.  If you requested that your care partner not be given the details of your procedure findings, then the procedure report has been included in a sealed envelope for you to review at your convenience later.  YOU SHOULD EXPECT:  Some feelings of bloating in the abdomen. Passage of more gas than usual.  Walking can help get rid of the air that was put into your GI tract during the procedure and reduce the bloating. If you had a lower endoscopy (such as a colonoscopy or flexible sigmoidoscopy) you may notice spotting of blood in your stool or on the toilet paper. If you underwent a bowel prep for your procedure, you may not have a normal bowel movement for a few days.  Please Note:  You might notice some irritation and congestion in your nose or some drainage.  This is from the oxygen used during your procedure.  There is no need for concern and it should clear up in a day or so.  SYMPTOMS TO REPORT IMMEDIATELY:   Following upper endoscopy (EGD)  Vomiting of blood or coffee ground material  New chest pain or pain under the shoulder blades  Painful or persistently difficult swallowing  New shortness of breath  Fever of 100F or higher  Black, tarry-looking stools  For urgent or emergent issues, a gastroenterologist can be reached at any hour by calling [phone]. Do not use MyChart messaging for urgent concerns.    DIET:  We do recommend a small meal at first, but then you may proceed to your regular diet.  Drink plenty of fluids but you should avoid alcoholic beverages for 24 hours.  ACTIVITY:  You should plan to take it easy for the rest of today and you should NOT DRIVE or use heavy machinery until tomorrow (because of the sedation medicines used during the test).    FOLLOW UP: Our staff will call the number listed on your records the next business day following your procedure.  We will call around 7:15- 8:00 am to check on you and address any questions or concerns that you may have regarding the information given to you following your procedure. If we do not reach you, we will leave a message.     If  any biopsies were taken you will be contacted by phone or by letter within the next 1-3 weeks.  Please call us   at [phone] if you have not heard about the biopsies in 3 weeks.    SIGNATURES/CONFIDENTIALITY: You and/or your care partner have signed paperwork which will be entered into your electronic medical record.  These signatures attest to the fact that that the information above on your After Visit Summary has been reviewed and is understood.  Full responsibility of the confidentiality of this discharge information lies with you and/or your care-partner.

## 2024-09-04 NOTE — Progress Notes (Signed)
 Sedate, gd SR, tolerated procedure well, VSS, report to RN

## 2024-09-04 NOTE — Progress Notes (Signed)
 Called to room to assist during endoscopic procedure.  Patient ID and intended procedure confirmed with present staff. Received instructions for my participation in the procedure from the performing physician.

## 2024-09-05 ENCOUNTER — Telehealth: Payer: Self-pay

## 2024-09-05 NOTE — Telephone Encounter (Signed)
 No answer on follow-up call. Left VM for pt using interpreter line.

## 2024-09-09 ENCOUNTER — Ambulatory Visit: Admitting: Nurse Practitioner

## 2024-09-09 ENCOUNTER — Ambulatory Visit: Payer: Self-pay | Admitting: Nurse Practitioner

## 2024-09-09 ENCOUNTER — Encounter: Payer: Self-pay | Admitting: Nurse Practitioner

## 2024-09-09 ENCOUNTER — Ambulatory Visit

## 2024-09-09 VITALS — BP 110/70 | HR 72 | Temp 97.1°F | Ht 64.0 in | Wt 146.6 lb

## 2024-09-09 DIAGNOSIS — Z0000 Encounter for general adult medical examination without abnormal findings: Secondary | ICD-10-CM

## 2024-09-09 DIAGNOSIS — M1990 Unspecified osteoarthritis, unspecified site: Secondary | ICD-10-CM | POA: Insufficient documentation

## 2024-09-09 DIAGNOSIS — R7303 Prediabetes: Secondary | ICD-10-CM

## 2024-09-09 DIAGNOSIS — R053 Chronic cough: Secondary | ICD-10-CM

## 2024-09-09 DIAGNOSIS — E7800 Pure hypercholesterolemia, unspecified: Secondary | ICD-10-CM

## 2024-09-09 DIAGNOSIS — E559 Vitamin D deficiency, unspecified: Secondary | ICD-10-CM | POA: Diagnosis not present

## 2024-09-09 DIAGNOSIS — D0511 Intraductal carcinoma in situ of right breast: Secondary | ICD-10-CM | POA: Diagnosis not present

## 2024-09-09 LAB — COMPREHENSIVE METABOLIC PANEL WITH GFR
ALT: 13 U/L (ref 0–35)
AST: 16 U/L (ref 0–37)
Albumin: 4.2 g/dL (ref 3.5–5.2)
Alkaline Phosphatase: 42 U/L (ref 39–117)
BUN: 17 mg/dL (ref 6–23)
CO2: 30 meq/L (ref 19–32)
Calcium: 9 mg/dL (ref 8.4–10.5)
Chloride: 102 meq/L (ref 96–112)
Creatinine, Ser: 0.75 mg/dL (ref 0.40–1.20)
GFR: 91.42 mL/min (ref >60.00)
Glucose, Bld: 96 mg/dL (ref 70–99)
Potassium: 3.9 meq/L (ref 3.5–5.1)
Sodium: 138 meq/L (ref 135–145)
Total Bilirubin: 0.5 mg/dL (ref 0.2–1.2)
Total Protein: 6.9 g/dL (ref 6.0–8.3)

## 2024-09-09 LAB — CBC WITH DIFFERENTIAL/PLATELET
Basophils Absolute: 0 K/uL (ref 0.0–0.1)
Basophils Relative: 0.7 % (ref 0.0–3.0)
Eosinophils Absolute: 0.2 K/uL (ref 0.0–0.7)
Eosinophils Relative: 6.2 % — ABNORMAL HIGH (ref 0.0–5.0)
HCT: 39.3 % (ref 36.0–46.0)
Hemoglobin: 12.9 g/dL (ref 12.0–15.0)
Lymphocytes Relative: 26 % (ref 12.0–46.0)
Lymphs Abs: 0.9 K/uL (ref 0.7–4.0)
MCHC: 32.8 g/dL (ref 30.0–36.0)
MCV: 89.1 fl (ref 78.0–100.0)
Monocytes Absolute: 0.2 K/uL (ref 0.1–1.0)
Monocytes Relative: 6.7 % (ref 3.0–12.0)
Neutro Abs: 2.1 K/uL (ref 1.4–7.7)
Neutrophils Relative %: 60.4 % (ref 43.0–77.0)
Platelets: 200 K/uL (ref 150.0–400.0)
RBC: 4.41 Mil/uL (ref 3.87–5.11)
RDW: 12.7 % (ref 11.5–15.5)
WBC: 3.5 K/uL — ABNORMAL LOW (ref 4.0–10.5)

## 2024-09-09 LAB — HEMOGLOBIN A1C: Hgb A1c MFr Bld: 5.6 % (ref 4.6–6.5)

## 2024-09-09 LAB — LIPID PANEL
Cholesterol: 192 mg/dL (ref 0–200)
HDL: 67.4 mg/dL (ref >39.00)
LDL Cholesterol: 99 mg/dL (ref 0–99)
NonHDL: 124.47
Total CHOL/HDL Ratio: 3
Triglycerides: 128 mg/dL (ref 0.0–149.0)
VLDL: 25.6 mg/dL (ref 0.0–40.0)

## 2024-09-09 LAB — SURGICAL PATHOLOGY

## 2024-09-09 LAB — VITAMIN D 25 HYDROXY (VIT D DEFICIENCY, FRACTURES): VITD: 11.54 ng/mL — ABNORMAL LOW (ref 30.00–100.00)

## 2024-09-09 MED ORDER — VITAMIN D (ERGOCALCIFEROL) 1.25 MG (50000 UNIT) PO CAPS: 50000.0000 [IU] | ORAL_CAPSULE | ORAL | 0 refills | Status: AC

## 2024-09-09 MED ORDER — FLUTICASONE PROPIONATE 50 MCG/ACT NA SUSP: 2.0000 | Freq: Every day | NASAL | 0 refills | Status: AC

## 2024-09-09 MED ORDER — IPRATROPIUM BROMIDE 0.06 % NA SOLN: NASAL | 1 refills | Status: AC

## 2024-09-09 NOTE — Assessment & Plan Note (Signed)
 Chronic improving. Continue flonase  and zyrtec  daily. She did not get x-ray from Dr. Thedora visit, but would like to do this today. Order placed.

## 2024-09-09 NOTE — Assessment & Plan Note (Signed)
 She is currently taking tamoxifen  and following with oncology. Continue collaboration and recommendations from specialist. Mammogram up to date.

## 2024-09-09 NOTE — Assessment & Plan Note (Signed)
Chronic, stable.  Check CMP, CBC, lipid panel today and treat based on results.

## 2024-09-09 NOTE — Assessment & Plan Note (Signed)
 Noted to right 5th finger. Can use voltaren gel 4 times a day PRN and ice or heat as needed.

## 2024-09-09 NOTE — Assessment & Plan Note (Signed)
Chronic, stable. Check A1c today and treat based on results.

## 2024-09-09 NOTE — Progress Notes (Signed)
 BP 110/70 (BP Location: Left Arm, Patient Position: Sitting, Cuff Size: Normal)   Pulse 72   Temp (!) 97.1 F (36.2 C)   Ht 5' 4 (1.626 m)   Wt 146 lb 9.6 oz (66.5 kg)   SpO2 97%   BMI 25.16 kg/m    Subjective:    Patient ID: Sierra Hensley, female    DOB: August 09, 1971, 53 y.o.   MRN: [id number]  CC: Chief Complaint  Patient presents with   Annual Exam    With fasting labs, no concerns   Visit completed with interpreter   HPI: Sierra Hensley is a 53 y.o. female presenting on 09/09/2024 for comprehensive medical examination. Current medical complaints include:none  Depression and Anxiety Screen done today and results listed below:     09/09/2024    8:22 AM 07/25/2023    8:12 AM 07/18/2022    8:25 AM 05/26/2022    2:02 PM 07/26/2018    2:34 PM  Depression screen PHQ 2/9  Decreased Interest 0 0 0 0 0  Down, Depressed, Hopeless 0 0 0 0 0  PHQ - 2 Score 0 0 0 0 0  Altered sleeping 1 0     Tired, decreased energy 0 0     Change in appetite 0 0     Feeling bad or failure about yourself  0 0     Trouble concentrating 0 0     Moving slowly or fidgety/restless 0 0     Suicidal thoughts 0 0     PHQ-9 Score 1 0     Difficult doing work/chores Not difficult at all Not difficult at all         09/09/2024    8:23 AM 07/25/2023    8:12 AM  GAD 7 : Generalized Anxiety Score  Nervous, Anxious, on Edge 0 0  Control/stop worrying 0 0  Worry too much - different things 0 0  Trouble relaxing 0 0  Restless 0 0  Easily annoyed or irritable 0 0  Afraid - awful might happen 0 0  Total GAD 7 Score 0 0  Anxiety Difficulty Not difficult at all Not difficult at all    The patient does not have a history of falls. I did not complete a risk assessment for falls. A plan of care for falls was not documented.   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   GERD (gastroesophageal reflux disease)    Small bowel obstruction (HCC) 10/13/2018    Surgical History:  Past Surgical History:  Procedure Laterality Date   BREAST BIOPSY Right 09/14/2023   MM RT BREAST BX W LOC DEV 1ST LESION IMAGE BX SPEC STEREO GUIDE 09/14/2023 [facility]   BREAST BIOPSY  10/02/2023   MM RT RADIOACTIVE SEED LOC MAMMO GUIDE 10/02/2023 [facility]   BREAST LUMPECTOMY WITH RADIOACTIVE SEED LOCALIZATION Right 10/03/2023   Procedure: RIGHT BREAST SEED LUMPECTOMY;  Surgeon: Vanderbilt Ned, MD;  Location: [facility];  Service: General;  Laterality: Right;   CESAREAN SECTION MULTI-GESTATIONAL N/A 12/09/2014   Procedure: CESAREAN SECTION MULTI-GESTATIONAL;  Surgeon: Krystal Deaner, MD;  Location: [facility];  Service: Obstetrics;  Laterality: N/A;   HERNIA REPAIR     LAPAROSCOPIC APPENDECTOMY N/A 10/03/2018   Procedure: APPENDECTOMY LAPAROSCOPIC ERAS PATHWAY;  Surgeon: Stevie,  Herlene Righter, MD;  Location: [facility];  Service: General;  Laterality: N/A;   LASIK Bilateral     Medications:  Current Outpatient Medications on File Prior to Visit  Medication Sig   cetirizine  (ZYRTEC ) 10 MG tablet Take 1 tablet by mouth once daily (Patient taking differently: Take by mouth daily as needed for allergies.)   cycloSPORINE (RESTASIS) 0.05 % ophthalmic emulsion Place 1 drop into both eyes 2 (two) times daily.   omeprazole  (PRILOSEC) 20 MG capsule Take 1 capsule (20 mg total) by mouth daily. Take 30 min before breakfast   tamoxifen  (NOLVADEX ) 10 MG tablet Take 1 tablet (10 mg total) by mouth daily.   metoCLOPramide  (REGLAN ) 10 MG tablet Take 1 tablet (10 mg total) by mouth 3 (three) times daily before meals. (Patient not taking: Reported on 09/09/2024)   No current facility-administered medications on file prior to visit.    Allergies:  No Known Allergies  Social History:  Social History   Socioeconomic History   Marital  status: Married    Spouse name: Not on file   Number of children: 3   Years of education: Not on file   Highest education level: Not on file  Occupational History   Not on file  Tobacco Use   Smoking status: Never   Smokeless tobacco: Never  Vaping Use   Vaping status: Never Used  Substance and Sexual Activity   Alcohol use: No   Drug use: No   Sexual activity: Yes    Birth control/protection: Post-menopausal  Other Topics Concern   Not on file  Social History Narrative   ** Merged History Encounter **       Social Drivers of Health   Financial Resource Strain: Not on file  Food Insecurity: No Food Insecurity (02/16/2021)   Received from [facility]   Hunger Vital Sign    Within the past 12 months, you worried that your food would run out before you got the money to buy more.: Never true    Within the past 12 months, the food you bought just didn't last and you didn't have money to get more.: Never true  Transportation Needs: Not on file  Physical Activity: Not on file  Stress: Not on file  Social Connections: Unknown (04/07/2022)   [facility]   Social Network    Social Network: Not on file  Intimate Partner Violence: Unknown (03/02/2022)   Received from [facility]   HITS    Physically Hurt: Not on file    Insult or Talk Down To: Not on file    Threaten Physical Harm: Not on file    Scream or Curse: Not on file   Social History   Tobacco Use  Smoking Status Never  Smokeless Tobacco Never   Social History   Substance and Sexual Activity  Alcohol Use No    Family History:  Family History  Problem Relation Age of Onset   Colon cancer Neg Hx    Esophageal cancer Neg Hx    Stomach cancer Neg Hx    Rectal cancer Neg Hx     Past medical history, surgical history, medications, allergies, family history and social history reviewed with patient today and changes made to appropriate areas of the chart.   Review of Systems  Constitutional:  Negative.   HENT: Negative.    Eyes: Negative.   Respiratory: Negative.    Cardiovascular: Negative.   Gastrointestinal: Negative.   Genitourinary: Negative.   Musculoskeletal:  Positive for joint pain (right 5th finger, muscle cramp left thumb).  Skin: Negative.   Neurological: Negative.   Psychiatric/Behavioral: Negative.     All other ROS negative except what is listed above and in the HPI.      Objective:    BP 110/70 (BP Location: Left Arm, Patient Position: Sitting, Cuff Size: Normal)   Pulse 72   Temp (!) 97.1 F (36.2 C)   Ht 5' 4 (1.626 m)   Wt 146 lb 9.6 oz (66.5 kg)   SpO2 97%   BMI 25.16 kg/m   Wt Readings from Last 3 Encounters:  09/09/24 146 lb 9.6 oz (66.5 kg)  09/04/24 147 lb (66.7 kg)  08/01/24 147 lb (66.7 kg)    Physical Exam Vitals and nursing note reviewed.  Constitutional:      General: She is not in acute distress.    Appearance: Normal appearance.  HENT:     Head: Normocephalic and atraumatic.     Right Ear: Tympanic membrane, ear canal and external ear normal.     Left Ear: Tympanic membrane, ear canal and external ear normal.     Mouth/Throat:     Mouth: Mucous membranes are moist.     Pharynx: No posterior oropharyngeal erythema.  Eyes:     Conjunctiva/sclera: Conjunctivae normal.  Cardiovascular:     Rate and Rhythm: Normal rate and regular rhythm.     Pulses: Normal pulses.     Heart sounds: Normal heart sounds.  Pulmonary:     Effort: Pulmonary effort is normal.     Breath sounds: Normal breath sounds.  Abdominal:     Palpations: Abdomen is soft.     Tenderness: There is no abdominal tenderness.  Musculoskeletal:        General: Normal range of motion.     Cervical back: Normal range of motion and neck supple.     Right lower leg: No edema.     Left lower leg: No edema.  Lymphadenopathy:     Cervical: No cervical adenopathy.  Skin:    General: Skin is warm and dry.  Neurological:     General: No focal deficit present.      Mental Status: She is alert and oriented to person, place, and time.     Cranial Nerves: No cranial nerve deficit.     Coordination: Coordination normal.     Gait: Gait normal.  Psychiatric:        Mood and Affect: Mood normal.        Behavior: Behavior normal.        Thought Content: Thought content normal.        Judgment: Judgment normal.     Results for orders placed or performed in visit on 01/30/24  Basic metabolic panel   Collection Time: 01/30/24  8:56 AM  Result Value Ref Range   Sodium 139 135 - 145 mEq/L   Potassium 4.7 3.5 - 5.1 mEq/L   Chloride 100 96 - 112 mEq/L   CO2 34 (H) 19 - 32 mEq/L   Glucose, Bld 86 70 - 99 mg/dL   BUN 15 6 - 23 mg/dL   Creatinine, Ser 9.29 0.40 - 1.20 mg/dL   GFR 00.25 >39.99 mL/min   Calcium  9.4 8.4 - 10.5 mg/dL  CBC with Differential/Platelet   Collection Time: 01/30/24  8:56 AM  Result Value Ref Range   WBC 3.5 (L) 4.0 - 10.5 K/uL   RBC 4.53 3.87 - 5.11 Mil/uL   Hemoglobin  13.4 12.0 - 15.0 g/dL   HCT 59.0 63.9 - 53.9 %   MCV 90.3 78.0 - 100.0 fl   MCHC 32.8 30.0 - 36.0 g/dL   RDW 87.0 88.4 - 84.4 %   Platelets 224.0 150.0 - 400.0 K/uL   Neutrophils Relative % 62.7 43.0 - 77.0 %   Lymphocytes Relative 24.3 12.0 - 46.0 %   Monocytes Relative 8.1 3.0 - 12.0 %   Eosinophils Relative 4.2 0.0 - 5.0 %   Basophils Relative 0.7 0.0 - 3.0 %   Neutro Abs 2.2 1.4 - 7.7 K/uL   Lymphs Abs 0.9 0.7 - 4.0 K/uL   Monocytes Absolute 0.3 0.1 - 1.0 K/uL   Eosinophils Absolute 0.1 0.0 - 0.7 K/uL   Basophils Absolute 0.0 0.0 - 0.1 K/uL  Hemoglobin A1c   Collection Time: 01/30/24  8:56 AM  Result Value Ref Range   Hgb A1c MFr Bld 5.5 4.6 - 6.5 %  VITAMIN D  25 Hydroxy (Vit-D Deficiency, Fractures)   Collection Time: 01/30/24  8:56 AM  Result Value Ref Range   VITD 16.35 (L) 30.00 - 100.00 ng/mL  Hepatitis B surface antibody,quantitative   Collection Time: 01/30/24  8:56 AM  Result Value Ref Range   Hep B S AB Quant (Post) 290 > OR = 10 mIU/mL   Hepatitis B surface antigen   Collection Time: 01/30/24  8:56 AM  Result Value Ref Range   Hepatitis B Surface Ag NON-REACTIVE NON-REACTIVE      Assessment & Plan:   Problem List Items Addressed This Visit       Musculoskeletal and Integument   Arthritis   Noted to right 5th finger. Can use voltaren gel 4 times a day PRN and ice or heat as needed.         Other   Chronic cough   Chronic improving. Continue flonase  and zyrtec  daily. She did not get x-ray from Dr. Thedora visit, but would like to do this today. Order placed.       Relevant Orders   DG Chest 2 View   Routine general medical examination at a health care facility - Primary   Health maintenance reviewed and updated. Discussed nutrition, exercise. Follow-up 1 year.        Pure hypercholesterolemia   Chronic, stable.  Check CMP, CBC, lipid panel today and treat based on results.      Relevant Orders   CBC with Differential/Platelet   Comprehensive metabolic panel with GFR   Lipid panel   Vitamin D  insufficiency   Check vitamin D  and treat based on results.      Relevant Orders   VITAMIN D  25 Hydroxy (Vit-D Deficiency, Fractures)   Ductal carcinoma in situ (DCIS) of right breast   She is currently taking tamoxifen  and following with oncology. Continue collaboration and recommendations from specialist. Mammogram up to date.       Prediabetes   Chronic, stable. Check A1c today and treat based on results.       Relevant Orders   Hemoglobin A1c     Follow up plan: Return in about 1 year (around 09/09/2025) for CPE.   LABORATORY TESTING:  - Pap smear: up to date  IMMUNIZATIONS:   - Tdap: Tetanus vaccination status reviewed: last tetanus booster within 10 years. - Influenza: Declined - Pneumovax: Not applicable - Prevnar: Up to date - HPV: Not applicable - Shingrix  vaccine: Up to date  SCREENING: -Mammogram: Up to date  - Colonoscopy: Up to date  -  Bone Density: Not applicable   PATIENT  COUNSELING:   Advised to take 1 mg of folate supplement per day if capable of pregnancy.   Sexuality: Discussed sexually transmitted diseases, partner selection, use of condoms, avoidance of unintended pregnancy  and contraceptive alternatives.   Advised to avoid cigarette smoking.  I discussed with the patient that most people either abstain from alcohol or drink within safe limits (<=14/week and <=4 drinks/occasion for males, <=7/weeks and <= 3 drinks/occasion for females) and that the risk for alcohol disorders and other health effects rises proportionally with the number of drinks per week and how often a drinker exceeds daily limits.  Discussed cessation/primary prevention of drug use and availability of treatment for abuse.   Diet: Encouraged to adjust caloric intake to maintain  or achieve ideal body weight, to reduce intake of dietary saturated fat and total fat, to limit sodium intake by avoiding high sodium foods and not adding table salt, and to maintain adequate dietary potassium and calcium  preferably from fresh fruits, vegetables, and low-fat dairy products.    stressed the importance of regular exercise  Injury prevention: Discussed safety belts, safety helmets, smoke detector, smoking near bedding or upholstery.   Dental health: Discussed importance of regular tooth brushing, flossing, and dental visits.    NEXT PREVENTATIVE PHYSICAL DUE IN 1 YEAR. Return in about 1 year (around 09/09/2025) for CPE.  Nicholas Ossa A Keyasia Jolliff

## 2024-09-09 NOTE — Assessment & Plan Note (Signed)
 Health maintenance reviewed and updated. Discussed nutrition, exercise. Follow-up 1 year.

## 2024-09-09 NOTE — Assessment & Plan Note (Signed)
Check vitamin D and treat based on results.  ?

## 2024-09-09 NOTE — Patient Instructions (Signed)
 It was great to see you!  We are checking your labs today and will let you know the results via mychart/phone.   You can use voltaren gel 4 times a day on your finger as needed for pain  Let's follow-up in 1 year, sooner if you have concerns.  If a referral was placed today, you will be contacted for an appointment. Please note that routine referrals can sometimes take up to 3-4 weeks to process. Please call our office if you haven't heard anything after this time frame.  Take care,  Tinnie Harada, NP

## 2024-09-10 ENCOUNTER — Ambulatory Visit: Payer: Self-pay | Admitting: Pediatrics

## 2024-09-24 ENCOUNTER — Inpatient Hospital Stay: Admitting: Hematology and Oncology

## 2024-09-24 NOTE — Assessment & Plan Note (Deleted)
 08/03/2023: Right lumpectomy: (Screening mammogram detected right breast calcifications): Intermediate to high-grade DCIS, margins negative, 2 cm, ER 95%, PR 50%   Treatment plan: 1. adjuvant radiation therapy: 11/07/2023-11/28/2023 2. Followed by antiestrogen therapy with tamoxifen  5 years started 11/29/2023 (10 mg dose)  Tamoxifen  toxicities:  Breast cancer surveillance: Breast exam 09/24/2024: Benign Mammogram and ultrasound 08/14/2024: Benign 3.7 cm seroma  Return to clinic in 1 year for follow-up

## 2024-09-25 ENCOUNTER — Ambulatory Visit: Payer: Self-pay

## 2024-09-25 ENCOUNTER — Telehealth: Payer: Self-pay | Admitting: Pediatrics

## 2024-09-25 ENCOUNTER — Inpatient Hospital Stay: Attending: Hematology and Oncology | Admitting: Hematology and Oncology

## 2024-09-25 VITALS — BP 98/70 | HR 78 | Temp 97.8°F | Resp 18 | Ht 64.0 in | Wt 147.1 lb

## 2024-09-25 DIAGNOSIS — D0511 Intraductal carcinoma in situ of right breast: Secondary | ICD-10-CM | POA: Diagnosis not present

## 2024-09-25 DIAGNOSIS — R232 Flushing: Secondary | ICD-10-CM | POA: Diagnosis not present

## 2024-09-25 DIAGNOSIS — Z79810 Long term (current) use of selective estrogen receptor modulators (SERMs): Secondary | ICD-10-CM | POA: Diagnosis not present

## 2024-09-25 DIAGNOSIS — R252 Cramp and spasm: Secondary | ICD-10-CM | POA: Insufficient documentation

## 2024-09-25 DIAGNOSIS — Z170 Estrogen receptor positive status [ER+]: Secondary | ICD-10-CM | POA: Insufficient documentation

## 2024-09-25 DIAGNOSIS — Z1721 Progesterone receptor positive status: Secondary | ICD-10-CM | POA: Insufficient documentation

## 2024-09-25 DIAGNOSIS — Z923 Personal history of irradiation: Secondary | ICD-10-CM | POA: Insufficient documentation

## 2024-09-25 NOTE — Telephone Encounter (Signed)
 Inbound call from patient stating that she is having right lower abdominal pain and is requesting a call back to discuss. Please advise.

## 2024-09-25 NOTE — Telephone Encounter (Signed)
 Noted. Pt with right sided abdominal pain. No availability in office until Nov, but was offered appt at a different office. Pt has chosen to go to [facility].

## 2024-09-25 NOTE — Progress Notes (Signed)
 Patient Care Team: Nedra Tinnie LABOR, NP as PCP - General (Internal Medicine) Odean Potts, MD as Consulting Physician (Hematology and Oncology) Dewey Rush, MD as Consulting Physician (Radiation Oncology) Vanderbilt Ned, MD as Consulting Physician (General Surgery) Crawford Morna Pickle, NP as Nurse Practitioner (Hematology and Oncology)  DIAGNOSIS:  Encounter Diagnosis  Name Primary?   Ductal carcinoma in situ (DCIS) of right breast Yes    SUMMARY OF ONCOLOGIC HISTORY: Oncology History  Ductal carcinoma in situ (DCIS) of right breast  10/03/2023 Surgery   Right Breast Lumpectomy: DCIS; DCIS present in anterior margin; DCIS 1.5 mm from posterior margin; no lymph nodes submitted; ER+/PR+   11/06/2023 - 11/28/2023 Radiation Therapy   Right Breast: 42.56 Gy in 16 treatments   11/15/2023 Cancer Staging   Staging form: Breast, AJCC 8th Edition - Clinical: Stage 0 (cTis (DCIS), cN0, cM0, ER+, PR+, HER2: Not Assessed) - Signed by Odean Potts, MD on 11/15/2023 Stage prefix: Initial diagnosis Nuclear grade: G3   01/16/2024 -  Anti-estrogen oral therapy   Tamoxifen      CHIEF COMPLIANT:   HISTORY OF PRESENT ILLNESS:  History of Present Illness Sierra Hensley is a 53 year old female who presents for follow-up regarding medication side effects.  She has been on tamoxifen  therapy since January and experiences mild hot flashes, which are tolerable. She also has occasional cramps in both her legs and hands. She consumes one bottle of water a day. A mammogram on September 17th revealed a small pocket of fluid, but she has no pain or discomfort from this finding. No chest pain.     ALLERGIES:  has no known allergies.  MEDICATIONS:  Current Outpatient Medications  Medication Sig Dispense Refill   cetirizine  (ZYRTEC ) 10 MG tablet Take 1 tablet by mouth once daily 180 tablet 0   cycloSPORINE (RESTASIS) 0.05 % ophthalmic emulsion Place 1 drop into both eyes 2 (two) times daily.      fluticasone  (FLONASE ) 50 MCG/ACT nasal spray Place 2 sprays into both nostrils daily. 16 g 0   ipratropium (ATROVENT ) 0.06 % nasal spray USE 2 SPRAY(S) IN EACH NOSTRIL 4 TIMES DAILY AS NEEDED FOR NASAL CONGESTION OR RUNNY NOSE 15 mL 1   omeprazole  (PRILOSEC) 20 MG capsule Take 1 capsule (20 mg total) by mouth daily. Take 30 min before breakfast 30 capsule 1   tamoxifen  (NOLVADEX ) 10 MG tablet Take 1 tablet (10 mg total) by mouth daily. 90 tablet 3   Vitamin D , Ergocalciferol , (DRISDOL ) 1.25 MG (50000 UNIT) CAPS capsule Take 1 capsule (50,000 Units total) by mouth every 7 (seven) days. 12 capsule 0   metoCLOPramide  (REGLAN ) 10 MG tablet Take 1 tablet (10 mg total) by mouth 3 (three) times daily before meals. (Patient not taking: Reported on 09/25/2024) 90 tablet 0   No current facility-administered medications for this visit.    PHYSICAL EXAMINATION: ECOG PERFORMANCE STATUS: 1 - Symptomatic but completely ambulatory  Vitals:   09/25/24 0844  BP: 98/70  Pulse: 78  Resp: 18  Temp: 97.8 F (36.6 C)  SpO2: 100%   Filed Weights   09/25/24 0844  Weight: 147 lb 1.6 oz (66.7 kg)    Physical Exam   (exam performed in the presence of a chaperone)  LABORATORY DATA:  I have reviewed the data as listed    Latest Ref Rng & Units 09/09/2024    9:03 AM 01/30/2024    8:56 AM 07/25/2023    8:52 AM  CMP  Glucose 70 - 99  mg/dL 96  86  73   BUN 6 - 23 mg/dL 17  15  13    Creatinine 0.40 - 1.20 mg/dL 9.24  9.29  9.29   Sodium 135 - 145 mEq/L 138  139  140   Potassium 3.5 - 5.1 mEq/L 3.9  4.7  4.2   Chloride 96 - 112 mEq/L 102  100  101   CO2 19 - 32 mEq/L 30  34  31   Calcium  8.4 - 10.5 mg/dL 9.0  9.4  9.1   Total Protein 6.0 - 8.3 g/dL 6.9   6.8   Total Bilirubin 0.2 - 1.2 mg/dL 0.5   0.6   Alkaline Phos 39 - 117 U/L 42   62   AST 0 - 37 U/L 16   20   ALT 0 - 35 U/L 13   28     Lab Results  Component Value Date   WBC 3.5 (L) 09/09/2024   HGB 12.9 09/09/2024   HCT 39.3 09/09/2024    MCV 89.1 09/09/2024   PLT 200.0 09/09/2024   NEUTROABS 2.1 09/09/2024    ASSESSMENT & PLAN:  Ductal carcinoma in situ (DCIS) of right breast 08/03/2023: Right lumpectomy: (Screening mammogram detected right breast calcifications): Intermediate to high-grade DCIS, margins negative, 2 cm, ER 95%, PR 50%   Recommendation: 1. adjuvant radiation therapy: Started 11/07/2023-11/28/2023 2. Followed by antiestrogen therapy with tamoxifen  5 years started since this okay 11/29/2023 (10 mg dose being preferred) ------------------------------------------------------------------------------------------------------------------------------------- Tamoxifen  Toxicities: Regular extremely well without any problems concerns  Breast Cancer Surveillance: Breast Exam: 09/25/24: Benign, breast tenderness and scar tissue in the right breast Mammograms: 08/14/24: 3.7 cm seroma Recommended Guardant reveal for MRD testing  RTC in 1 year ------------------------------------- Assessment and Plan Assessment & Plan Ductal carcinoma in situ of right breast Recent mammogram showed asymptomatic fluid pocket. Recurrence risk over ten years is 2%. - Continue annual mammograms for recurrence monitoring. - Monitor fluid pocket; consider surgical drainage if symptomatic.  Adverse effects of tamoxifen  therapy Mild hot flashes and occasional cramps. Side effects tolerable. Low water intake may contribute to cramps. - Encouraged increasing water intake to four bottles daily to alleviate cramps. - Continue tamoxifen  therapy as prescribed with refills available until next year.      No orders of the defined types were placed in this encounter.  The patient has a good understanding of the overall plan. she agrees with it. she will call with any problems that may develop before the next visit here.  I personally spent a total of 30 minutes in the care of the patient today including preparing to see the patient,  getting/reviewing separately obtained history, performing a medically appropriate exam/evaluation, counseling and educating, placing orders, referring and communicating with other health care professionals, documenting clinical information in the EHR, independently interpreting results, communicating results, and coordinating care.   Viinay K Nyonna Hargrove, MD 09/25/24

## 2024-09-25 NOTE — Telephone Encounter (Signed)
 Called & spoke with patient. Patient has never been evaluated for abdominal pain at the office and has been advised that she needs an office visit. Offered patient an appt tomorrow with an APP but she prefers early morning appt. Patient has been scheduled at 8:40 am with Sierra Coombs, PA on Friday, 09/27/24. Patient verbalized understanding & had no concerns at the end of the call.

## 2024-09-25 NOTE — Assessment & Plan Note (Signed)
 08/03/2023: Right lumpectomy: (Screening mammogram detected right breast calcifications): Intermediate to high-grade DCIS, margins negative, 2 cm, ER 95%, PR 50%   Recommendation: 1. adjuvant radiation therapy: Started 11/07/2023-11/28/2023 2. Followed by antiestrogen therapy with tamoxifen  5 years to start 11/29/2023 (10 mg dose being preferred) ------------------------------------------------------------------------------------------------------------------------------------- Tamoxifen  Toxicities:    Breast Cancer Surveillance: Breast Exam: 09/25/24: Benign Mammograms: 08/14/24: 3.7 cm seroma Recommended Guardant reveal for MRD testing  RTC in 1 year

## 2024-09-25 NOTE — Telephone Encounter (Signed)
 FYI Only or Action Required?: FYI only for provider: Advised UC.  Patient was last seen in primary care on 09/09/2024 by Nedra Tinnie LABOR, NP.  Called Nurse Triage reporting Abdominal Pain.  Symptoms began a week ago.  Interventions attempted: Nothing.  Symptoms are: gradually worsening.  Triage Disposition: See Physician Within 24 Hours  Patient/caregiver understands and will follow disposition?: Yes  Copied from CRM [id number]. Topic: Clinical - Red Word Triage >> Sep 25, 2024 11:40 AM Alfonso ORN wrote: Red Word that prompted transfer to Nurse Triage: pain in right stomach for past week Reason for Disposition  [1] MODERATE pain (e.g., interferes with normal activities) AND [2] pain comes and goes (cramps) AND [3] present > 24 hours  (Exception: Pain with Vomiting or Diarrhea - see that Guideline.)  Answer Assessment - Initial Assessment Questions Using [facility] PI#[id number].  Pt requesting appt today. No appt availability in office until November. Offered appt at different [facility] office in the afternoon tomorrow. Pt declines stating she needs a morning appointment. Advised UC today or tomorrow. Pt reports she will go to UC.  1. LOCATION: Where does it hurt?      Right stomach area towards hip  2. RADIATION: Does the pain shoot anywhere else? (e.g., chest, back)     No  3. ONSET: When did the pain begin? (e.g., minutes, hours or days ago)      1 week ago  4. SUDDEN: Gradual or sudden onset?     5-6/10 pain only when pressing on abdomen. Otherwise no pain.  5. PATTERN Does the pain come and go, or is it constant?     Intermittent, only when pressing on belly  6. SEVERITY: How bad is the pain?  (e.g., Scale 1-10; mild, moderate, or severe)     Severe pain when pressing on right side of abdomen  7. RECURRENT SYMPTOM: Have you ever had this type of stomach pain before? If Yes, ask: When was the last time? and What happened that time?      2-3 years  ago had appendectomy. Has been having a sensation on right side of abdomen since surgery  8. CAUSE: What do you think is causing the stomach pain? (e.g., gallstones, recent abdominal surgery)     Possibly r/t appendectomy  9. RELIEVING/AGGRAVATING FACTORS: What makes it better or worse? (e.g., antacids, bending or twisting motion, bowel movement)     When pressing on right side of abdomen, pain increases  10. OTHER SYMPTOMS: Do you have any other symptoms? (e.g., back pain, diarrhea, fever, urination pain, vomiting)       No, fatigue.  Protocols used: Abdominal Pain - Female-A-AH

## 2024-09-25 NOTE — Telephone Encounter (Signed)
 Pt called back saying she tried to get an appt and they told her needed to see her PCP. She states she is feeling ok right now. No pain unless she touches it. Offered multiple appts for tomorrow or Friday. Pt requested Monday appt in the am. Rn scheduled with another provider due to pt request for time. Pt agrees.

## 2024-09-26 NOTE — Progress Notes (Unsigned)
 09/27/2024 [name] [id number] 1971-06-03  Referring provider: Nedra Tinnie LABOR, NP Primary GI doctor: Dr. Suzann  ASSESSMENT AND PLAN:  GIM, GERD, globus sensation and increased burping x 1 year. Started on Pantoprazole  40mg  every day per PCP 04/2024.  09/04/2024 EGD normal esophagus status post empiric dilation normal stomach duodenum  Path showed mild chronic gastritis with intestinal metaplasia, negative H. pylori, negative EOE  She states her symptoms are much improved - Continue Pantoprazole  40 mg daily - continue Famotidine 20 mg 1 tab p.o. nightly -Needs repeat EGD 08/2025 secondary GIM for mapping  RUQ pain  X 1 week, worse with palpation/movement Not worse with food, no nausea, vomiting, diarrhea, constipation.  She feels tired, no fever or chills.  Normal labs 09/09/2024 + carrnett sign -Get on salon pas patches, consider injection -Will get RUQ US  to evaluate further   Colon cancer screening   Patient reported undergoing a colonoscopy in [place] in 2019 which she believes was normal.   Negative Cologuard 06/09/2022. 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   I have reviewed the clinic note as outlined by Alan Coombs, PA and agree with the assessment, plan and medical decision making.  Ms. Jantz returns to the office today status post recent EGD that showed evidence of mild reflux on biopsies, chronic gastritis with intestinal metaplasia-no H. pylori.  GERD symptoms are improved on PPI and H2 blocker.  She has had right upper quadrant abdominal pain over the last week.  Positive Carnett's sign per APP note.  Agree with right upper  quadrant ultrasound for further evaluation.  Continue stool testing with Cologuard.  Inocente Suzann, MD    Patient Care Team: Nedra Tinnie LABOR, NP as PCP - General (Internal Medicine) Odean Potts, MD as Consulting Physician (Hematology and Oncology) Dewey Rush, MD as Consulting Physician (Radiation Oncology) Vanderbilt Ned, MD as Consulting Physician (General Surgery) Causey, Morna Pickle, NP as Nurse Practitioner (Hematology and Oncology)  HISTORY OF PRESENT ILLNESS: 53 y.o. [place] female with a past medical history listed below presents for evaluation of AB pain.   Patient last seen in the office 9//2025 by Nyle Sharps, NP for GERD with globulus sensation  Discussed the use of AI scribe software for clinical note transcription with the patient, who gave verbal consent to proceed.  History of Present Illness   DALEAH COULSON is a 53 year old female who presents with abdominal pain and a history of esophageal dilation.  She underwent an endoscopy on October 8th, which revealed a normal esophagus, but it was empirically dilated to alleviate her symptoms of a sensation of something in the throat. Biopsies taken during the procedure were negative for H. pylori or infection but showed inflammation and some cellular changes.  She reports improvement in her upper abdominal symptoms, including reduced burping and gas. However, she experiences pain in the lower abdomen, which is not constant but occurs when pressure is applied. The pain does not radiate and is not exacerbated by food intake. She describes a twisting sensation in the abdomen and reports a history of appendectomy in November 2019. She reports normal bowel movements and gas passage.  During the review of symptoms,  she notes that deep breathing causes some pain in the abdominal area, but it does not radiate. No fever, chills, shortness of breath, or chest pain.        She  reports that she has never smoked. She has  never used smokeless tobacco. She reports that she does not drink alcohol and does not use drugs.  RELEVANT GI HISTORY, IMAGING AND LABS: Results   DIAGNOSTIC Endoscopy: Normal esophagus, empirical dilation performed (09/04/2024)  PATHOLOGY Esophageal biopsy: Negative for H. pylori or infection; inflammation and cellular changes present (09/04/2024)      CBC    Component Value Date/Time   WBC 3.5 (L) 09/09/2024 0903   RBC 4.41 09/09/2024 0903   HGB 12.9 09/09/2024 0903   HGB 12.2 05/15/2018 0954   HCT 39.3 09/09/2024 0903   HCT 36.8 05/15/2018 0954   PLT 200.0 09/09/2024 0903   PLT 238 05/15/2018 0954   MCV 89.1 09/09/2024 0903   MCV 84.3 08/03/2018 1418   MCV 84 05/15/2018 0954   MCH 28.8 09/29/2023 1007   MCHC 32.8 09/09/2024 0903   RDW 12.7 09/09/2024 0903   RDW 15.5 (H) 05/15/2018 0954   LYMPHSABS 0.9 09/09/2024 0903   LYMPHSABS 1.3 05/15/2018 0954   MONOABS 0.2 09/09/2024 0903   EOSABS 0.2 09/09/2024 0903   EOSABS 0.3 05/15/2018 0954   BASOSABS 0.0 09/09/2024 0903   BASOSABS 0.0 05/15/2018 0954   Recent Labs    09/29/23 1007 01/30/24 0856 09/09/24 0903  HGB 13.4 13.4 12.9    CMP     Component Value Date/Time   NA 138 09/09/2024 0903   NA 138 05/15/2018 0954   K 3.9 09/09/2024 0903   CL 102 09/09/2024 0903   CO2 30 09/09/2024 0903   GLUCOSE 96 09/09/2024 0903   BUN 17 09/09/2024 0903   BUN 10 05/15/2018 0954   CREATININE 0.75 09/09/2024 0903   CALCIUM  9.0 09/09/2024 0903   PROT 6.9 09/09/2024 0903   PROT 7.2 05/15/2018 0954   ALBUMIN 4.2 09/09/2024 0903   ALBUMIN 4.1 05/15/2018 0954   AST 16 09/09/2024 0903   ALT 13 09/09/2024 0903   ALKPHOS 42 09/09/2024 0903   BILITOT 0.5 09/09/2024 0903   BILITOT 0.4 05/15/2018 0954   GFRNONAA >60 10/18/2018 0921   GFRAA >60 10/18/2018 0921      Latest Ref Rng & Units 09/09/2024    9:03 AM 07/25/2023    8:52 AM 03/06/2023   11:58 AM  Hepatic Function  Total Protein 6.0 - 8.3 g/dL 6.9  6.8  7.6   Albumin  3.5 - 5.2 g/dL 4.2  3.9  4.4   AST 0 - 37 U/L 16  20  33   ALT 0 - 35 U/L 13  28  21    Alk Phosphatase 39 - 117 U/L 42  62  66   Total Bilirubin 0.2 - 1.2 mg/dL 0.5  0.6  0.5       Current Medications:     Current Outpatient Medications (Respiratory):    cetirizine  (ZYRTEC ) 10 MG tablet, Take 1 tablet by mouth once daily   fluticasone  (FLONASE ) 50 MCG/ACT nasal spray, Place 2 sprays into both nostrils daily.   ipratropium (ATROVENT ) 0.06 % nasal spray, USE 2 SPRAY(S) IN EACH NOSTRIL 4 TIMES DAILY AS NEEDED FOR NASAL CONGESTION OR RUNNY NOSE    Current Outpatient Medications (Other):    cycloSPORINE (RESTASIS) 0.05 % ophthalmic emulsion, Place 1 drop into both eyes 2 (two) times daily.  omeprazole  (PRILOSEC) 20 MG capsule, Take 1 capsule (20 mg total) by mouth daily. Take 30 min before breakfast   tamoxifen  (NOLVADEX ) 10 MG tablet, Take 1 tablet (10 mg total) by mouth daily.   Vitamin D , Ergocalciferol , (DRISDOL ) 1.25 MG (50000 UNIT) CAPS capsule, Take 1 capsule (50,000 Units total) by mouth every 7 (seven) days.   metoCLOPramide  (REGLAN ) 10 MG tablet, Take 1 tablet (10 mg total) by mouth 3 (three) times daily before meals. 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 Family History:  Her family history  is not on file.  REVIEW OF SYSTEMS  : All other systems reviewed and negative except where noted in the History of Present Illness.  PHYSICAL EXAM: BP 110/78   Pulse 85   Ht 5' 4 (1.626 m)   Wt 150 lb (68 kg)   BMI 25.75 kg/m  Physical Exam   GENERAL APPEARANCE: Well nourished, in no apparent distress. HEENT: No cervical lymphadenopathy, unremarkable thyroid, sclerae anicteric, conjunctiva pink. RESPIRATORY: Respiratory effort normal, breath sounds equal bilaterally without rales, rhonchi, or wheezing. Lungs clear to auscultation bilaterally. CARDIO: Regular rate and rhythm with no murmurs, rubs, or gallops, peripheral pulses intact. ABDOMEN: Soft, non-distended, active bowel sounds in all four quadrants, + RUQ tenderness to palpation, + Carrnett's sign no rebound, no mass appreciated.  RECTAL: Declines. MUSCULOSKELETAL: Full range of motion, normal gait, without edema. SKIN: Dry, intact without rashes or lesions. No jaundice. NEURO: Alert, oriented, no focal deficits. PSYCH: Cooperative, normal mood and affect.      Alan JONELLE Coombs, PA-C 9:30 AM

## 2024-09-27 ENCOUNTER — Ambulatory Visit: Admitting: Physician Assistant

## 2024-09-27 ENCOUNTER — Encounter: Payer: Self-pay | Admitting: Physician Assistant

## 2024-09-27 VITALS — BP 110/78 | HR 85 | Ht 64.0 in | Wt 150.0 lb

## 2024-09-27 DIAGNOSIS — K219 Gastro-esophageal reflux disease without esophagitis: Secondary | ICD-10-CM | POA: Diagnosis not present

## 2024-09-27 DIAGNOSIS — R09A2 Foreign body sensation, throat: Secondary | ICD-10-CM

## 2024-09-27 DIAGNOSIS — K31A19 Gastric intestinal metaplasia without dysplasia, unspecified site: Secondary | ICD-10-CM

## 2024-09-27 DIAGNOSIS — R1011 Right upper quadrant pain: Secondary | ICD-10-CM | POA: Diagnosis not present

## 2024-09-27 DIAGNOSIS — Z853 Personal history of malignant neoplasm of breast: Secondary | ICD-10-CM

## 2024-09-27 NOTE — Patient Instructions (Addendum)
 TM T?T CHUY?N KHM:  Hm nay b?n ??n khm v ?au b?ng v ti?n s? gin th?c qu?n. N?i soi g?n ?y cho th?y th?c qu?n bnh th??ng nh?ng c m?t s? d?u hi?u vim v thay ??i t? bo. B?n bo co cc tri?u ch?ng ? b?ng trn ? c?i thi?n nh?ng v?n cn ?au b?ng d??i.  K? HO?CH C?A B?N:  ?AU THNH B?NG: ?au b?ng d??i c?a b?n c th? do co th?t c? ho?c kch thch th?n kinh, v ch?c n?ng ru?t c?a b?n v?n bnh th??ng, gip b?n khng ph?i lo l?ng v? cc v?n ?? n?i t?ng nghim tr?ng. -S? d?ng mi?ng dn [place] ?? gi?m ?au. -Chng ti s? ch? ??nh siu m b?ng ?? ki?m tra ti m?t, gan v cc c?u trc khc c?a b?n.  B?n ? ???c ln l?ch siu m b?ng t?i B?nh vi?n Khu v?c [place] (t?ng 1 c?a b?nh vi?n) vo ngy 03/08/2024 lc 9:00 sng. Vui lng ??n s?m 30 pht tr??c gi? h?n ?? ??ng k. Vui lng khng ?n u?ng g 8 ti?ng tr??c gi? h?n. N?u b?n c?n ??i l?ch h?n, vui lng lin h? khoa X-quang theo s? [phone]. Xt nghi?m ny th??ng m?t kho?ng 30 pht ?? th?c hi?n.  Do nh?ng thay ??i g?n ?y trong lu?t ch?m [place] s?c kh?e, b?n c th? xem k?t qu? ch?p chi?u v xt nghi?m trn MyChart tr??c khi bc s? c?a b?n c c? h?i xem xt. Chng ti hi?u r?ng trong m?t s? tr??ng h?p, c th? c nh?ng k?t qu? gy nh?m l?n ho?c lo l?ng cho b?n. Khng ph?i t?t c? k?t qu? xt nghi?m ??u ???c tr? v? trong cng m?t khung th?i gian v bc s? c th? ?ang ch? nhi?u k?t qu? ?? phn tch cc k?t qu? khc. Vui lng cho chng ti 48 gi? ?? bc s? c?a b?n xem xt k? l??ng t?t c? k?t qu? tr??c khi lin h? v?i phng khm ?? lm r k?t qu? c?a b?n.

## 2024-09-30 ENCOUNTER — Ambulatory Visit: Admitting: Physician Assistant

## 2024-10-01 ENCOUNTER — Ambulatory Visit

## 2024-10-28 ENCOUNTER — Other Ambulatory Visit: Payer: Self-pay

## 2024-10-31 ENCOUNTER — Ambulatory Visit (INDEPENDENT_AMBULATORY_CARE_PROVIDER_SITE_OTHER): Admitting: Family Medicine

## 2024-10-31 ENCOUNTER — Encounter: Payer: Self-pay | Admitting: Family Medicine

## 2024-10-31 VITALS — BP 108/74 | HR 71 | Temp 98.2°F | Ht 64.0 in | Wt 145.4 lb

## 2024-10-31 DIAGNOSIS — H9312 Tinnitus, left ear: Secondary | ICD-10-CM

## 2024-10-31 DIAGNOSIS — H6123 Impacted cerumen, bilateral: Secondary | ICD-10-CM

## 2024-10-31 DIAGNOSIS — Z79899 Other long term (current) drug therapy: Secondary | ICD-10-CM

## 2024-10-31 NOTE — Patient Instructions (Addendum)
 H??ng D?n Sau Khm -  tai theo nh?p m?ch  B?n ?ang c tri?u ch?ng  tai theo nh?p m?ch (nghe ti?ng "r r" ho?c "??p theo nh?p tim" trong tai). ?i?u ny c th? lin quan ??n m?ch mu, p l?c trong ??u, ho?c tnh tr?ng c?a tai.  Hi?n t?i, xt nghi?m mu v cc ki?m tra khc ch?a cho th?y nguyn nhn r rng. Chng ti s? ti?p t?c theo di.  H??ng d?n t?i nh  Theo di huy?t p: Vui lng ?o huy?t p m?i khi b?n nghe r ti?ng  tai theo nh?p m?ch. Ghi l?i ch? s? n?u c th?SABRA Both dng thu?c NSAIDs: Trnh cc thu?c ch?ng vim gi?m ?au nh? ibuprofen , naproxen,. tr? khi bc s? cho php.  Theo di tri?u ch?ng: N?u tri?u ch?ng t?ng ln, ko di, ho?c xu?t hi?n thm ?au ??u, thay ??i th? l?c, t y?u tay chn, hy lin h? ngay.  Khi no c?n lin h? v?i bc s? Ti?ng  tai ko di ho?c n?ng h?n Huy?t p cao khi c tri?u ch?ng  Xu?t hi?n m?t trong cc d?u hi?u sau: ?au ??u d? d?i Thay ??i th? l?c Y?u ho?c t n?a ng??i M?t th?ng b?ng Lin h?  N?u tri?u ch?ng khng c?i thi?n, ho?c b?n c lo l?ng g thm, hy g?i cho chng ti ?? ???c ?nh gi s?m h?n.

## 2024-10-31 NOTE — Progress Notes (Signed)
 Assessment and Plan  1. Tinnitus of left ear, pulsatile, intermittent (Primary) CBC, CMP reviewed. No TSH recently. Advised her to monitor BP at home. Red flags reviewed. ENT/hearing referral if not improving.   2. Bilateral impacted cerumen Lavage today by CMA without complications.   3. High risk medication use, Tamoxifen  Slightly increased risk of venous thrombosis. Return precautions: neuro deficits, severe/worsening headache, vision changes. Assessment & Plan Pulsatile tinnitus Intermittent pulsatile tinnitus. Tamoxifen  use noted but unlikely the cause. - Flushed out ear wax to assess for improvement in tinnitus. - Monitor for dizziness, vision changes, fever, or swelling. - Consider medication adjustment if tinnitus persists after ear wax removal.  Impacted cerumen, bilateral Bilateral impacted cerumen potentially contributing to tinnitus. - Flushed out ear wax bilaterally. - Re-evaluated ear after wax removal.  History of breast cancer On tamoxifen . No indication tamoxifen  causes tinnitus, but side effects reviewed. - Reviewed tamoxifen  side effects and will consider alternative medications if necessary.  Sierra Shutter, DO, MS, FAAFP, Dipl. [facility] [address] [place], [address] Dept: [phone] Dept Fax: [phone]  Subjective:   Discussed the use of AI scribe software for clinical note transcription with the patient, who gave verbal consent to proceed. History of Present Illness Sierra Hensley is a 53 year old female who presents with pulsatile tinnitus.  Pulsatile tinnitus - Persistent 'woo, woo, laurance' noise in the ear - Occurs consistently throughout the day - No associated hearing loss, dizziness, or vision changes - No recent illness, head trauma, or sinus issues prior to onset  Head sensations - Occasional sensation of head becoming hot around noon - No associated fevers or swelling  Ear wax  buildup - History of ear wax accumulation - Previously relieved by ear cleaning  Medication use - Tamoxifen  taken daily  Review of Systems: Negative, with the exception of above mentioned in HPI.  Current Outpatient Medications:    cetirizine  (ZYRTEC ) 10 MG tablet, Take 1 tablet by mouth once daily, Disp: 180 tablet, Rfl: 0   cycloSPORINE (RESTASIS) 0.05 % ophthalmic emulsion, Place 1 drop into both eyes 2 (two) times daily., Disp: , Rfl:    fluticasone  (FLONASE ) 50 MCG/ACT nasal spray, Place 2 sprays into both nostrils daily., Disp: 16 g, Rfl: 0   ipratropium (ATROVENT ) 0.06 % nasal spray, USE 2 SPRAY(S) IN EACH NOSTRIL 4 TIMES DAILY AS NEEDED FOR NASAL CONGESTION OR RUNNY NOSE, Disp: 15 mL, Rfl: 1   metoCLOPramide  (REGLAN ) 10 MG tablet, Take 1 tablet (10 mg total) by mouth 3 (three) times daily before meals., Disp: 90 tablet, Rfl: 0   omeprazole  (PRILOSEC) 20 MG capsule, Take 1 capsule (20 mg total) by mouth daily. Take 30 min before breakfast, Disp: 30 capsule, Rfl: 1   tamoxifen  (NOLVADEX ) 10 MG tablet, Take 1 tablet (10 mg total) by mouth daily., Disp: 90 tablet, Rfl: 3   Vitamin D , Ergocalciferol , (DRISDOL ) 1.25 MG (50000 UNIT) CAPS capsule, Take 1 capsule (50,000 Units total) by mouth every 7 (seven) days., Disp: 12 capsule, Rfl: 0   Objective:   BP 108/74 (BP Location: Right Arm, Cuff Size: Normal)   Pulse 71   Temp 98.2 F (36.8 C)   Ht 5' 4 (1.626 m)   Wt 145 lb 6.4 oz (66 kg)   SpO2 98%   BMI 24.96 kg/m   Wt Readings from Last 3 Encounters:  10/31/24 145 lb 6.4 oz (66 kg)  09/27/24 150 lb (68 kg)  09/25/24 147  lb 1.6 oz (66.7 kg)   Physical Exam Constitutional:      General: She is not in acute distress.    Comments: Vietnamese native language, but communicates well.  HENT:     Head: Normocephalic and atraumatic.     Right Ear: There is impacted cerumen.     Left Ear: There is impacted cerumen.     Ears:     Comments: After lavage, rechecked. Canals and TMs  without concerning findings bilaterally.  Eyes:     Extraocular Movements: Extraocular movements intact.     Conjunctiva/sclera: Conjunctivae normal.  Neurological:     General: No focal deficit present.     Mental Status: She is alert.  Psychiatric:        Mood and Affect: Mood normal.    Lab Results  Component Value Date   CREATININE 0.75 09/09/2024   BUN 17 09/09/2024   NA 138 09/09/2024   K 3.9 09/09/2024   CL 102 09/09/2024   CO2 30 09/09/2024   Lab Results  Component Value Date   ALT 13 09/09/2024   AST 16 09/09/2024   ALKPHOS 42 09/09/2024   BILITOT 0.5 09/09/2024   Lab Results  Component Value Date   HGBA1C 5.6 09/09/2024   HGBA1C 5.5 01/30/2024   HGBA1C 5.7 07/25/2023   HGBA1C 5.3 05/15/2018   Lab Results  Component Value Date   TSH 2.06 03/06/2023   Lab Results  Component Value Date   CHOL 192 09/09/2024   HDL 67.40 09/09/2024   LDLCALC 99 09/09/2024   TRIG 128.0 09/09/2024   CHOLHDL 3 09/09/2024   Lab Results  Component Value Date   VD25OH 11.54 (L) 09/09/2024   VD25OH 16.35 (L) 01/30/2024   VD25OH 16.93 (L) 07/25/2023   Lab Results  Component Value Date   WBC 3.5 (L) 09/09/2024   HGB 12.9 09/09/2024   HCT 39.3 09/09/2024   MCV 89.1 09/09/2024   PLT 200.0 09/09/2024   Lab Results  Component Value Date   IRON 86 03/06/2023   TIBC 332 03/06/2023   FERRITIN 54 03/06/2023

## 2024-11-20 ENCOUNTER — Other Ambulatory Visit: Payer: Self-pay | Admitting: Hematology and Oncology

## 2024-11-26 ENCOUNTER — Telehealth: Payer: Self-pay | Admitting: Nurse Practitioner

## 2024-11-26 DIAGNOSIS — H9312 Tinnitus, left ear: Secondary | ICD-10-CM

## 2024-11-26 NOTE — Telephone Encounter (Signed)
 Pt had an appt with Dr Prentiss on 10/31/24 for this concern.

## 2024-11-26 NOTE — Telephone Encounter (Signed)
 Copied from CRM [id number]. Topic: Referral - Question >> Nov 26, 2024  9:19 AM Rea BROCKS wrote: Reason for CRM: Patient called in and stated that she was seen a couple weeks back by Dr. Prentiss in regards to ear issue and was told to call back if symptoms persist to be seen by a specialist/another doctor. Tried to schedule an appt but patient was advised to call back for other doctor information.   [phone] (M)

## 2024-11-27 NOTE — Telephone Encounter (Signed)
 I called and spoke with patient and notified her that a referral was paced to ENT. I told patient if she does not hear anything in the next week to call the office.

## 2024-11-29 NOTE — Telephone Encounter (Signed)
 Telephone of referred to office [phone]. Address: [address] [place] [address]

## 2024-12-26 ENCOUNTER — Other Ambulatory Visit: Payer: Self-pay | Admitting: Hematology and Oncology

## 2025-01-09 ENCOUNTER — Institutional Professional Consult (permissible substitution) (INDEPENDENT_AMBULATORY_CARE_PROVIDER_SITE_OTHER): Admitting: Otolaryngology

## 2025-09-25 ENCOUNTER — Inpatient Hospital Stay: Admitting: Hematology and Oncology
